# Patient Record
Sex: Female | Born: 1940 | Race: Black or African American | Hispanic: No | Smoking: Former smoker
Health system: Southern US, Community
[De-identification: ages and names within clinical notes are randomized; demographics above are authoritative.]

## PROBLEM LIST (undated history)

## (undated) DIAGNOSIS — I219 Acute myocardial infarction, unspecified: Secondary | ICD-10-CM

## (undated) DIAGNOSIS — I10 Essential (primary) hypertension: Secondary | ICD-10-CM

## (undated) DIAGNOSIS — E119 Type 2 diabetes mellitus without complications: Secondary | ICD-10-CM

## (undated) DIAGNOSIS — D649 Anemia, unspecified: Secondary | ICD-10-CM

## (undated) HISTORY — PX: ABDOMINAL HYSTERECTOMY: SHX81

---

## 2003-08-20 ENCOUNTER — Encounter: Payer: Self-pay | Admitting: Emergency Medicine

## 2003-08-20 ENCOUNTER — Emergency Department (HOSPITAL_COMMUNITY): Admission: EM | Admit: 2003-08-20 | Discharge: 2003-08-20 | Payer: Self-pay | Admitting: Internal Medicine

## 2006-06-15 ENCOUNTER — Encounter (HOSPITAL_COMMUNITY): Admission: RE | Admit: 2006-06-15 | Discharge: 2006-08-26 | Payer: Self-pay | Admitting: Gastroenterology

## 2006-06-18 ENCOUNTER — Ambulatory Visit (HOSPITAL_COMMUNITY): Admission: RE | Admit: 2006-06-18 | Discharge: 2006-06-18 | Payer: Self-pay | Admitting: Gastroenterology

## 2006-06-22 ENCOUNTER — Ambulatory Visit (HOSPITAL_COMMUNITY): Admission: RE | Admit: 2006-06-22 | Discharge: 2006-06-22 | Payer: Self-pay | Admitting: Gastroenterology

## 2014-05-03 ENCOUNTER — Encounter (HOSPITAL_COMMUNITY): Payer: Self-pay | Admitting: Emergency Medicine

## 2014-05-03 ENCOUNTER — Emergency Department (INDEPENDENT_AMBULATORY_CARE_PROVIDER_SITE_OTHER)
Admission: EM | Admit: 2014-05-03 | Discharge: 2014-05-03 | Disposition: A | Discharge status: Home / Self Care | Payer: Medicare PPO | Source: Home / Self Care | Attending: Family Medicine | Admitting: Family Medicine

## 2014-05-03 DIAGNOSIS — I10 Essential (primary) hypertension: Secondary | ICD-10-CM

## 2014-05-03 HISTORY — DX: Essential (primary) hypertension: I10

## 2014-05-03 LAB — POCT I-STAT, CHEM 8
BUN: 20 mg/dL (ref 6–23)
CALCIUM ION: 1.32 mmol/L — AB (ref 1.13–1.30)
CREATININE: 1 mg/dL (ref 0.50–1.10)
Chloride: 101 mEq/L (ref 96–112)
Glucose, Bld: 123 mg/dL — ABNORMAL HIGH (ref 70–99)
HCT: 46 % (ref 36.0–46.0)
HEMOGLOBIN: 15.6 g/dL — AB (ref 12.0–15.0)
POTASSIUM: 3.6 meq/L — AB (ref 3.7–5.3)
Sodium: 145 mEq/L (ref 137–147)
TCO2: 30 mmol/L (ref 0–100)

## 2014-05-03 MED ORDER — BENAZEPRIL-HYDROCHLOROTHIAZIDE 20-25 MG PO TABS: 1.0000 | ORAL_TABLET | Freq: Every day | ORAL | Status: DC

## 2014-05-03 MED ORDER — METOPROLOL SUCCINATE ER 50 MG PO TB24: 50.0000 mg | ORAL_TABLET | Freq: Every day | ORAL | Status: DC

## 2014-05-03 NOTE — ED Notes (Signed)
Pt is here to have meds refilled  Voices no problems Alert w/no signs of acute distress.

## 2014-05-03 NOTE — ED Provider Notes (Signed)
Jasmin Miller is a 73 y.o. female who presents to Urgent Care today for blood pressure medication refill. Patient typically goes to an urgent care on [facility]. for her blood pressure management. It's more convenient for her to attend this clinic today. She requires refill of for her Lotensin HCT and Toprol-XL. She feels well. No chest pain palpitations or shortness of breath.   Past Medical History  Diagnosis Date  . Hypertension    History  Substance Use Topics  . Smoking status: Never Smoker   . Smokeless tobacco: Not on file  . Alcohol Use: No   ROS as above Medications: No current facility-administered medications for this encounter.   Current Outpatient Prescriptions  Medication Sig Dispense Refill  . lansoprazole (PREVACID) 30 MG capsule Take 30 mg by mouth daily at 12 noon.      . simvastatin (ZOCOR) 20 MG tablet Take 20 mg by mouth daily.      . benazepril-hydrochlorthiazide (LOTENSIN HCT) 20-25 MG per tablet Take 1 tablet by mouth daily.  30 tablet  0  . metoprolol succinate (TOPROL-XL) 50 MG 24 hr tablet Take 1 tablet (50 mg total) by mouth daily. Take with or immediately following a meal.  30 tablet  0    Exam:  BP 153/50  Pulse 90  Temp(Src) 98.4 F (36.9 C) (Oral)  Resp 18  SpO2 95% Gen: Well NAD HEENT:  MMM Lungs: Normal work of breathing. CTABL Heart: RRR no MRG Abd: NABS, Soft. NT, ND Exts: Brisk capillary refill, warm and well perfused.   Results for orders placed during the hospital encounter of 05/03/14 (from the past 24 hour(s))  POCT I-STAT, CHEM 8     Status: Abnormal   Collection Time    05/03/14  5:44 PM      Result Value Ref Range   Sodium 145  137 - 147 mEq/L   Potassium 3.6 (*) 3.7 - 5.3 mEq/L   Chloride 101  96 - 112 mEq/L   BUN 20  6 - 23 mg/dL   Creatinine, Ser 1.82  0.50 - 1.10 mg/dL   Glucose, Bld 993 (*) 70 - 99 mg/dL   Calcium, Ion 7.16 (*) 1.13 - 1.30 mmol/L   TCO2 30  0 - 100 mmol/L   Hemoglobin 15.6 (*) 12.0 - 15.0 g/dL    HCT 96.7  89.3 - 81.0 %   No results found.  Assessment and Plan: 73 y.o. female with refill medications as above. Followup with primary care provider.  Discussed warning signs or symptoms. Please see discharge instructions. Patient expresses understanding.    Rodolph Bong, MD 05/03/14 [phone]

## 2014-05-03 NOTE — Discharge Instructions (Signed)
Thank you for coming in today. Please follow up with your primary doctor.  Call or go to the emergency room if you get worse, have trouble breathing, have chest pains, or palpitations.    Arterial Hypertension Arterial hypertension (high blood pressure) is a condition of elevated pressure in your blood vessels. Hypertension over a long period of time is a risk factor for strokes, heart attacks, and heart failure. It is also the leading cause of kidney (renal) failure.  CAUSES   In Adults -- Over 90% of all hypertension has no known cause. This is called essential or primary hypertension. In the other 10% of people with hypertension, the increase in blood pressure is caused by another disorder. This is called secondary hypertension. Important causes of secondary hypertension are:  Heavy alcohol use.  Obstructive sleep apnea.  Hyperaldosterosim (Conn's syndrome).  Steroid use.  Chronic kidney failure.  Hyperparathyroidism.  Medications.  Renal artery stenosis.  Pheochromocytoma.  Cushing's disease.  Coarctation of the aorta.  Scleroderma renal crisis.  Licorice (in excessive amounts).  Drugs (cocaine, methamphetamine). Your caregiver can explain any items above that apply to you.  In Children -- Secondary hypertension is more common and should always be considered.  Pregnancy -- Few women of childbearing age have high blood pressure. However, up to 10% of them develop hypertension of pregnancy. Generally, this will not harm the woman. It may be a sign of 3 complications of pregnancy: preeclampsia, HELLP syndrome, and eclampsia. Follow up and control with medication is necessary. SYMPTOMS   This condition normally does not produce any noticeable symptoms. It is usually found during a routine exam.  Malignant hypertension is a late problem of high blood pressure. It may have the following symptoms:  Headaches.  Blurred vision.  End-organ damage (this means your  kidneys, heart, lungs, and other organs are being damaged).  Stressful situations can increase the blood pressure. If a person with normal blood pressure has their blood pressure go up while being seen by their caregiver, this is often termed "white coat hypertension." Its importance is not known. It may be related with eventually developing hypertension or complications of hypertension.  Hypertension is often confused with mental tension, stress, and anxiety. DIAGNOSIS  The diagnosis is made by 3 separate blood pressure measurements. They are taken at least 1 week apart from each other. If there is organ damage from hypertension, the diagnosis may be made without repeat measurements. Hypertension is usually identified by having blood pressure readings:  Above 140/90 mmHg measured in both arms, at 3 separate times, over a couple weeks.  Over 130/80 mmHg should be considered a risk factor and may require treatment in patients with diabetes. Blood pressure readings over 120/80 mmHg are called "pre-hypertension" even in non-diabetic patients. To get a true blood pressure measurement, use the following guidelines. Be aware of the factors that can alter blood pressure readings.  Take measurements at least 1 hour after caffeine.  Take measurements 30 minutes after smoking and without any stress. This is another reason to quit smoking  it raises your blood pressure.  Use a proper cuff size. Ask your caregiver if you are not sure about your cuff size.  Most home blood pressure cuffs are automatic. They will measure systolic and diastolic pressures. The systolic pressure is the pressure reading at the start of sounds. Diastolic pressure is the pressure at which the sounds disappear. If you are elderly, measure pressures in multiple postures. Try sitting, lying or standing.  Sit  at rest for a minimum of 5 minutes before taking measurements.  You should not be on any medications like decongestants.  These are found in many cold medications.  Record your blood pressure readings and review them with your caregiver. If you have hypertension:  Your caregiver may do tests to be sure you do not have secondary hypertension (see "causes" above).  Your caregiver may also look for signs of metabolic syndrome. This is also called Syndrome X or Insulin Resistance Syndrome. You may have this syndrome if you have type 2 diabetes, abdominal obesity, and abnormal blood lipids in addition to hypertension.  Your caregiver will take your medical and family history and perform a physical exam.  Diagnostic tests may include blood tests (for glucose, cholesterol, potassium, and kidney function), a urinalysis, or an EKG. Other tests may also be necessary depending on your condition. PREVENTION  There are important lifestyle issues that you can adopt to reduce your chance of developing hypertension:  Maintain a normal weight.  Limit the amount of salt (sodium) in your diet.  Exercise often.  Limit alcohol intake.  Get enough potassium in your diet. Discuss specific advice with your caregiver.  Follow a DASH diet (dietary approaches to stop hypertension). This diet is rich in fruits, vegetables, and low-fat dairy products, and avoids certain fats. PROGNOSIS  Essential hypertension cannot be cured. Lifestyle changes and medical treatment can lower blood pressure and reduce complications. The prognosis of secondary hypertension depends on the underlying cause. Many people whose hypertension is controlled with medicine or lifestyle changes can live a normal, healthy life.  RISKS AND COMPLICATIONS  While high blood pressure alone is not an illness, it often requires treatment due to its short- and long-term effects on many organs. Hypertension increases your risk for:  CVAs or strokes (cerebrovascular accident).  Heart failure due to chronically high blood pressure (hypertensive cardiomyopathy).  Heart  attack (myocardial infarction).  Damage to the retina (hypertensive retinopathy).  Kidney failure (hypertensive nephropathy). Your caregiver can explain list items above that apply to you. Treatment of hypertension can significantly reduce the risk of complications. TREATMENT   For overweight patients, weight loss and regular exercise are recommended. Physical fitness lowers blood pressure.  Mild hypertension is usually treated with diet and exercise. A diet rich in fruits and vegetables, fat-free dairy products, and foods low in fat and salt (sodium) can help lower blood pressure. Decreasing salt intake decreases blood pressure in a 1/3 of people.  Stop smoking if you are a smoker. The steps above are highly effective in reducing blood pressure. While these actions are easy to suggest, they are difficult to achieve. Most patients with moderate or severe hypertension end up requiring medications to bring their blood pressure down to a normal level. There are several classes of medications for treatment. Blood pressure pills (antihypertensives) will lower blood pressure by their different actions. Lowering the blood pressure by 10 mmHg may decrease the risk of complications by as much as 25%. The goal of treatment is effective blood pressure control. This will reduce your risk for complications. Your caregiver will help you determine the best treatment for you according to your lifestyle. What is excellent treatment for one person, may not be for you. HOME CARE INSTRUCTIONS   Do not smoke.  Follow the lifestyle changes outlined in the "Prevention" section.  If you are on medications, follow the directions carefully. Blood pressure medications must be taken as prescribed. Skipping doses reduces their benefit. It also puts  you at risk for problems.  Follow up with your caregiver, as directed.  If you are asked to monitor your blood pressure at home, follow the guidelines in the "Diagnosis"  section above. SEEK MEDICAL CARE IF:   You think you are having medication side effects.  You have recurrent headaches or lightheadedness.  You have swelling in your ankles.  You have trouble with your vision. SEEK IMMEDIATE MEDICAL CARE IF:   You have sudden onset of chest pain or pressure, difficulty breathing, or other symptoms of a heart attack.  You have a severe headache.  You have symptoms of a stroke (such as sudden weakness, difficulty speaking, difficulty walking). MAKE SURE YOU:   Understand these instructions.  Will watch your condition.  Will get help right away if you are not doing well or get worse. Document Released: 11/24/2005 Document Revised: 02/16/2012 Document Reviewed: 06/24/2007 [facility] Patient Information 2014 [place], [place].

## 2014-12-17 ENCOUNTER — Encounter (HOSPITAL_COMMUNITY): Payer: Self-pay

## 2014-12-17 ENCOUNTER — Emergency Department (INDEPENDENT_AMBULATORY_CARE_PROVIDER_SITE_OTHER)
Admission: EM | Admit: 2014-12-17 | Discharge: 2014-12-17 | Disposition: A | Discharge status: Home / Self Care | Payer: Medicare PPO | Source: Home / Self Care | Attending: Emergency Medicine | Admitting: Emergency Medicine

## 2014-12-17 DIAGNOSIS — K649 Unspecified hemorrhoids: Secondary | ICD-10-CM

## 2014-12-17 LAB — POCT I-STAT, CHEM 8
BUN: 21 mg/dL (ref 6–23)
Calcium, Ion: 1.19 mmol/L (ref 1.13–1.30)
Chloride: 99 mEq/L (ref 96–112)
Creatinine, Ser: 0.9 mg/dL (ref 0.50–1.10)
Glucose, Bld: 134 mg/dL — ABNORMAL HIGH (ref 70–99)
HEMATOCRIT: 51 % — AB (ref 36.0–46.0)
HEMOGLOBIN: 17.3 g/dL — AB (ref 12.0–15.0)
Potassium: 3.5 mmol/L (ref 3.5–5.1)
Sodium: 138 mmol/L (ref 135–145)
TCO2: 25 mmol/L (ref 0–100)

## 2014-12-17 MED ORDER — POLYETHYLENE GLYCOL 3350 17 GM/SCOOP PO POWD: 17.0000 g | Freq: Three times a day (TID) | ORAL | Status: DC | PRN

## 2014-12-17 MED ORDER — SENNOSIDES-DOCUSATE SODIUM 8.6-50 MG PO TABS: 2.0000 | ORAL_TABLET | Freq: Every day | ORAL | Status: DC

## 2014-12-17 NOTE — ED Provider Notes (Signed)
CSN: [id number]     Arrival date & time 12/17/14  1855 History   First MD Initiated Contact with Patient 12/17/14 1904     Chief Complaint  Patient presents with  . Rectal Bleeding   (Consider location/radiation/quality/duration/timing/severity/associated sxs/prior Treatment) HPI She is a 73 year old woman here with her daughter for evaluation of rectal bleeding. She states she had a small amount of blood with a bowel movement last night. She had another bowel movement around 6 PM that had quite a bit of bright red blood in it. She states that she has a little bit of blood anytime she strains to use the bathroom, but this was more blood than is usual. She does report some lightheadedness that gets worse on standing as well as some shortness of breath and tingling in her legs.  No chest pain.  She states she has chronic anemia for which she takes an iron pill daily.  She does have a history of hemorrhoids.  Past Medical History  Diagnosis Date  . Hypertension    History reviewed. No pertinent past surgical history. No family history on file. History  Substance Use Topics  . Smoking status: Never Smoker   . Smokeless tobacco: Not on file  . Alcohol Use: No   OB History    No data available     Review of Systems  Respiratory: Positive for shortness of breath.   Cardiovascular: Negative for chest pain.  Gastrointestinal: Positive for constipation and blood in stool.  Neurological: Positive for dizziness and numbness.    Allergies  Review of patient's allergies indicates no known allergies.  Home Medications   Prior to Admission medications   Medication Sig Start Date End Date Taking? Authorizing Provider  benazepril-hydrochlorthiazide (LOTENSIN HCT) 20-25 MG per tablet Take 1 tablet by mouth daily. 05/03/14   Rodolph Bong, MD  lansoprazole (PREVACID) 30 MG capsule Take 30 mg by mouth daily at 12 noon.    Historical Provider, MD  metoprolol succinate (TOPROL-XL) 50 MG 24 hr  tablet Take 1 tablet (50 mg total) by mouth daily. Take with or immediately following a meal. 05/03/14   Rodolph Bong, MD  polyethylene glycol powder (GLYCOLAX/MIRALAX) powder Take 17 g by mouth 3 (three) times daily as needed for mild constipation or moderate constipation. 12/17/14   Charm Rings, MD  senna-docusate (SENOKOT-S) 8.6-50 MG per tablet Take 2 tablets by mouth at bedtime. 12/17/14   Charm Rings, MD  simvastatin (ZOCOR) 20 MG tablet Take 20 mg by mouth daily.    Historical Provider, MD   BP 150/88 mmHg  Pulse 89  Temp(Src) 98.2 F (36.8 C) (Oral)  Resp 18  SpO2 96% Physical Exam  Constitutional: She is oriented to person, place, and time. She appears well-developed and well-nourished. No distress.  Cardiovascular: Normal rate, regular rhythm and normal heart sounds.   No murmur heard. Pulmonary/Chest: Effort normal and breath sounds normal. No respiratory distress. She has no wheezes. She has no rales.  Genitourinary: Rectal exam shows external hemorrhoid. Rectal exam shows no internal hemorrhoid, no fissure and no tenderness. Guaiac positive stool.  Neurological: She is alert and oriented to person, place, and time.    ED Course  Procedures (including critical care time) Labs Review Labs Reviewed  POCT I-STAT, CHEM 8 - Abnormal; Notable for the following:    Glucose, Bld 134 (*)    Hemoglobin 17.3 (*)    HCT 51.0 (*)    All other components within normal limits  Imaging Review No results found.   MDM   1. Hemorrhoids, unspecified hemorrhoid type    Hemoglobin is stable. Dizziness and shortness of breath is likely secondary to a vasovagal response. She states it is starting to improve. We'll treat constipation with Senokot and Miralax. Follow-up as needed.    Charm RingsErin J Janeese Mcgloin, MD 12/17/14 [phone]

## 2014-12-17 NOTE — Discharge Instructions (Signed)
You have a hemorrhoid that bleeds with straining. Take Sena-Kot 2 tablets at bedtime. Take Miralax 2-3 times a day until you are having a soft bowel movement daily.  After that, take it once a day.  If you develop diarrhea, back off on the Miralax. You hemoglobin is stable.  Follow up as needed.

## 2014-12-17 NOTE — ED Notes (Signed)
Patient states had a bowel movement earlier today with bright red bleeding Patient states she does have a history of hemorrhoids Patient is feeling lightheaded and legs are weak and tingling

## 2015-10-03 ENCOUNTER — Emergency Department (HOSPITAL_COMMUNITY): Payer: Medicare HMO

## 2015-10-03 ENCOUNTER — Encounter (HOSPITAL_COMMUNITY): Payer: Self-pay | Admitting: Emergency Medicine

## 2015-10-03 ENCOUNTER — Emergency Department (HOSPITAL_COMMUNITY)
Admission: EM | Admit: 2015-10-03 | Discharge: 2015-10-03 | Disposition: A | Discharge status: Home / Self Care | Payer: Medicare HMO | Attending: Emergency Medicine | Admitting: Emergency Medicine

## 2015-10-03 DIAGNOSIS — R200 Anesthesia of skin: Secondary | ICD-10-CM | POA: Diagnosis present

## 2015-10-03 DIAGNOSIS — R0602 Shortness of breath: Secondary | ICD-10-CM | POA: Insufficient documentation

## 2015-10-03 DIAGNOSIS — R42 Dizziness and giddiness: Secondary | ICD-10-CM | POA: Diagnosis not present

## 2015-10-03 DIAGNOSIS — Z7982 Long term (current) use of aspirin: Secondary | ICD-10-CM | POA: Insufficient documentation

## 2015-10-03 DIAGNOSIS — Z79899 Other long term (current) drug therapy: Secondary | ICD-10-CM | POA: Insufficient documentation

## 2015-10-03 DIAGNOSIS — I10 Essential (primary) hypertension: Secondary | ICD-10-CM | POA: Insufficient documentation

## 2015-10-03 LAB — URINALYSIS, ROUTINE W REFLEX MICROSCOPIC
BILIRUBIN URINE: NEGATIVE
GLUCOSE, UA: NEGATIVE mg/dL
Ketones, ur: NEGATIVE mg/dL
Leukocytes, UA: NEGATIVE
Nitrite: NEGATIVE
PH: 6 (ref 5.0–8.0)
Protein, ur: NEGATIVE mg/dL
Specific Gravity, Urine: 1.008 (ref 1.005–1.030)
Urobilinogen, UA: 0.2 mg/dL (ref 0.0–1.0)

## 2015-10-03 LAB — COMPREHENSIVE METABOLIC PANEL
ALBUMIN: 4.3 g/dL (ref 3.5–5.0)
ALT: 18 U/L (ref 14–54)
ANION GAP: 10 (ref 5–15)
AST: 22 U/L (ref 15–41)
Alkaline Phosphatase: 85 U/L (ref 38–126)
BILIRUBIN TOTAL: 0.7 mg/dL (ref 0.3–1.2)
BUN: 11 mg/dL (ref 6–20)
CO2: 27 mmol/L (ref 22–32)
Calcium: 9.8 mg/dL (ref 8.9–10.3)
Chloride: 100 mmol/L — ABNORMAL LOW (ref 101–111)
Creatinine, Ser: 0.73 mg/dL (ref 0.44–1.00)
GFR calc Af Amer: >60 mL/min (ref >60)
GLUCOSE: 115 mg/dL — AB (ref 65–99)
Potassium: 3.2 mmol/L — ABNORMAL LOW (ref 3.5–5.1)
Sodium: 137 mmol/L (ref 135–145)
TOTAL PROTEIN: 8.5 g/dL — AB (ref 6.5–8.1)

## 2015-10-03 LAB — CBC
HCT: 45.3 % (ref 36.0–46.0)
Hemoglobin: 15.5 g/dL — ABNORMAL HIGH (ref 12.0–15.0)
MCH: 31.8 pg (ref 26.0–34.0)
MCHC: 34.2 g/dL (ref 30.0–36.0)
MCV: 92.8 fL (ref 78.0–100.0)
Platelets: 227 10*3/uL (ref 150–400)
RBC: 4.88 MIL/uL (ref 3.87–5.11)
RDW: 13 % (ref 11.5–15.5)
WBC: 8.9 10*3/uL (ref 4.0–10.5)

## 2015-10-03 LAB — DIFFERENTIAL
BASOS ABS: 0 10*3/uL (ref 0.0–0.1)
Basophils Relative: 0 %
EOS ABS: 0.1 10*3/uL (ref 0.0–0.7)
EOS PCT: 1 %
LYMPHS ABS: 1.4 10*3/uL (ref 0.7–4.0)
Lymphocytes Relative: 16 %
Monocytes Absolute: 0.5 10*3/uL (ref 0.1–1.0)
Monocytes Relative: 6 %
NEUTROS PCT: 77 %
Neutro Abs: 6.9 10*3/uL (ref 1.7–7.7)

## 2015-10-03 LAB — URINE MICROSCOPIC-ADD ON

## 2015-10-03 LAB — ETHANOL

## 2015-10-03 LAB — TROPONIN I: Troponin I: <0.03 ng/mL (ref <0.031)

## 2015-10-03 LAB — RAPID URINE DRUG SCREEN, HOSP PERFORMED
Amphetamines: NOT DETECTED
BENZODIAZEPINES: NOT DETECTED
Barbiturates: NOT DETECTED
Cocaine: NOT DETECTED
OPIATES: NOT DETECTED
TETRAHYDROCANNABINOL: NOT DETECTED

## 2015-10-03 LAB — APTT: APTT: 32 s (ref 24–37)

## 2015-10-03 LAB — PROTIME-INR
INR: 0.98 (ref 0.00–1.49)
PROTHROMBIN TIME: 13.2 s (ref 11.6–15.2)

## 2015-10-03 MED ORDER — METOPROLOL SUCCINATE ER 50 MG PO TB24: 50.0000 mg | ORAL_TABLET | Freq: Every day | ORAL | Status: DC

## 2015-10-03 MED ORDER — BENAZEPRIL-HYDROCHLOROTHIAZIDE 20-25 MG PO TABS: 1.0000 | ORAL_TABLET | Freq: Every day | ORAL | Status: DC

## 2015-10-03 MED ORDER — NIFEDIPINE ER 30 MG PO TB24: 30.0000 mg | ORAL_TABLET | Freq: Every evening | ORAL | Status: DC

## 2015-10-03 NOTE — ED Provider Notes (Signed)
3:24 PM BP 155/79 mmHg  Pulse 82  Temp(Src) 98.1 F (36.7 C)  Resp 18  SpO2 100% [facility], out of meds. New onset left hand paresthsia. Consult with neurologist- will need MRI. Currently pending.  5:00 PM BP 169/86 mmHg  Pulse 77  Temp(Src) 98.1 F (36.7 C)  Resp 16  SpO2 97% Patient MRI  Is negative. Appears safe for discharge as planned with initial provider,.  Arthor CaptainAbigail Kaemon Barnett, PA-C 10/03/15 1703  Elwin MochaBlair Walden, MD 10/06/15 [phone]

## 2015-10-03 NOTE — ED Provider Notes (Signed)
74 year old female, presents with a couple of complaints including left ear fullness as well as left hand numbness, it is unclear the exact onset of the symptoms however it seems to been going on for couple of days, the hand is gradually improving and she states it is almost completely resolved. She denies any weakness with this, there is no leg symptoms, no facial symptoms and on exam she has a normal neurologic exam including sensory exam of the upper extremities lower extremities as well as strength of the upper and lower extremities and cranial nerves III through XII. Abdomen chest and pulmonary exams are normal, no carotid bruits, labs EKG and CT scan are normal, MRI will be ordered to rule out stroke. Neurology was consult by Mr. Dahlia ClientBrowning  Medical screening examination/treatment/procedure(s) were conducted as a shared visit with non-physician practitioner(s) and myself.  I personally evaluated the patient during the encounter.  Clinical Impression:   Final diagnoses:  Numbness of left hand  Essential hypertension      EKG Interpretation  Date/Time:  Wednesday October 03 2015 12:20:32 EDT Ventricular Rate:  90 PR Interval:  132 QRS Duration: 76 QT Interval:  375 QTC Calculation: 459 R Axis:   13 Text Interpretation:  Sinus rhythm Ventricular premature complex Probable left atrial enlargement since last tracing no significant change Confirmed by Hyacinth MeekerMILLER  MD, Calleen Alvis ([id number]) on 10/03/2015 12:24:02 PM        Eber HongBrian Culley Hedeen, MD 10/04/15 [phone]

## 2015-10-03 NOTE — ED Provider Notes (Signed)
CSN: [id number]     Arrival date & time 10/03/15  1204 History   First MD Initiated Contact with Patient 10/03/15 1210     Chief Complaint  Patient presents with  . Hypertension     (Consider location/radiation/quality/duration/timing/severity/associated sxs/prior Treatment) HPI Comments: Patient presents to the emergency department with chief complaint of left arm numbness and tingling. She states that the symptoms started about 3 days ago. She states that she was seen by her doctor today, and was referred to the emergency department for stroke rule out. Patient states that her blood pressure has been running high. She has been taking her blood pressure medicines as no relief. She states that she has had some associated dizziness and shortness of breath. She denies any headache, chest pain, nausea, or vomiting. There are no aggravating or alleviating factors.  She takes a baby aspirin daily. Denies any other complaints at this time.  The history is provided by the patient. No language interpreter was used.    Past Medical History  Diagnosis Date  . Hypertension    History reviewed. No pertinent past surgical history. No family history on file. Social History  Substance Use Topics  . Smoking status: Never Smoker   . Smokeless tobacco: None  . Alcohol Use: No   OB History    No data available     Review of Systems  Constitutional: Negative for fever and chills.  Respiratory: Negative for shortness of breath.   Cardiovascular: Negative for chest pain.  Gastrointestinal: Negative for nausea, vomiting, diarrhea and constipation.  Genitourinary: Negative for dysuria.  Neurological: Positive for numbness.  All other systems reviewed and are negative.     Allergies  Review of patient's allergies indicates no known allergies.  Home Medications   Prior to Admission medications   Medication Sig Start Date End Date Taking? Authorizing Provider  benazepril-hydrochlorthiazide  (LOTENSIN HCT) 20-25 MG per tablet Take 1 tablet by mouth daily. 05/03/14   Rodolph BongEvan S Corey, MD  lansoprazole (PREVACID) 30 MG capsule Take 30 mg by mouth daily at 12 noon.    Historical Provider, MD  metoprolol succinate (TOPROL-XL) 50 MG 24 hr tablet Take 1 tablet (50 mg total) by mouth daily. Take with or immediately following a meal. 05/03/14   Rodolph BongEvan S Corey, MD  polyethylene glycol powder (GLYCOLAX/MIRALAX) powder Take 17 g by mouth 3 (three) times daily as needed for mild constipation or moderate constipation. 12/17/14   Charm RingsErin J Honig, MD  senna-docusate (SENOKOT-S) 8.6-50 MG per tablet Take 2 tablets by mouth at bedtime. 12/17/14   Charm RingsErin J Honig, MD  simvastatin (ZOCOR) 20 MG tablet Take 20 mg by mouth daily.    Historical Provider, MD   BP 187/93 mmHg  Pulse 85  Temp(Src) 98.1 F (36.7 C)  SpO2 98% Physical Exam  Constitutional: She is oriented to person, place, and time. She appears well-developed and well-nourished.  HENT:  Head: Normocephalic and atraumatic.  Eyes: Conjunctivae and EOM are normal. Pupils are equal, round, and reactive to light.  Neck: Normal range of motion. Neck supple.  Cardiovascular: Normal rate and regular rhythm.  Exam reveals no gallop and no friction rub.   No murmur heard. Pulmonary/Chest: Effort normal and breath sounds normal. No respiratory distress. She has no wheezes. She has no rales. She exhibits no tenderness.  Abdominal: Soft. Bowel sounds are normal. She exhibits no distension and no mass. There is no tenderness. There is no rebound and no guarding.  Musculoskeletal: Normal range of  motion. She exhibits no edema or tenderness.  CN 3-12 intact, speech is clear, movements are goal oriented, normal finger to nose, no pronator drift, reports decreased sensation on left hand, but equal sensation on exam  Neurological: She is alert and oriented to person, place, and time.  Skin: Skin is warm and dry.  Psychiatric: She has a normal mood and affect. Her behavior  is normal. Judgment and thought content normal.  Nursing note and vitals reviewed.   ED Course  Procedures (including critical care time) Labs Review Labs Reviewed  CBC - Abnormal; Notable for the following:    Hemoglobin 15.5 (*)    All other components within normal limits  PROTIME-INR  APTT  DIFFERENTIAL  ETHANOL  COMPREHENSIVE METABOLIC PANEL  URINE RAPID DRUG SCREEN, HOSP PERFORMED  URINALYSIS, ROUTINE W REFLEX MICROSCOPIC (NOT AT [facility])  TROPONIN I    Imaging Review No results found. I have personally reviewed and evaluated these images and lab results as part of my medical decision-making.   EKG Interpretation   Date/Time:  Wednesday October 03 2015 12:20:32 EDT Ventricular Rate:  90 PR Interval:  132 QRS Duration: 76 QT Interval:  375 QTC Calculation: 459 R Axis:   13 Text Interpretation:  Sinus rhythm Ventricular premature complex Probable  left atrial enlargement since last tracing no significant change Confirmed  by MILLER  MD, BRIAN ([id number]) on 10/03/2015 12:24:02 PM      MDM   Final diagnoses:  Numbness of left hand    Patient with left hand numbness.  No strength deficits.  No other focal deficits.  Sent to ED by PCP for stroke workup.  Will check labs and CT.  2:12 PM Labs and imaging are reassuring.  Patient seen by and discussed with Dr. Hyacinth Meeker, who recommends neurology consult.  Anticipated MRI and home if negative.  Appreciate telephone consultation from Dr. Hosie Poisson, who recommends MRI and DC with outpatient follow-up if negative.    Patient understands and agrees with the plan.  Patient signed out to Manuela Neptune, who will continue care.  Plan:  F/u on MRI.  If negative, DC to home with outpatient follow-up.      Roxy Horseman, PA-C 10/03/15 1533  Eber Hong, MD 10/04/15 [phone]

## 2015-10-03 NOTE — ED Notes (Signed)
Pt to ED reports unable to decrease BP although meds. Also report hearing loss left side and tingling left hand. Pt denies chest pain nor headache yet feels dizzy and shortness of breath. Hx  HTN , send by PCP to be evaluated for possible stroke. Pt denies weakness to extremities, no neuro symptoms noticed upon assessment. No facial drooping  Either.

## 2015-10-03 NOTE — Discharge Instructions (Signed)
Paresthesia Paresthesia is an abnormal burning or prickling sensation. This sensation is generally felt in the hands, arms, legs, or feet. However, it may occur in any part of the body. Usually, it is not painful. The feeling may be described as:  Tingling or numbness.  Pins and needles.  Skin crawling.  Buzzing.  Limbs falling asleep.  Itching. Most people experience temporary (transient) paresthesia at some time in their lives. Paresthesia may occur when you breathe too quickly (hyperventilation). It can also occur without any apparent cause. Commonly, paresthesia occurs when pressure is placed on a nerve. The sensation quickly goes away after the pressure is removed. For some people, however, paresthesia is a long-lasting (chronic) condition that is caused by an underlying disorder. If you continue to have paresthesia, you may need further medical evaluation. HOME CARE INSTRUCTIONS Watch your condition for any changes. Taking the following actions may help to lessen any discomfort that you are feeling:  Avoid drinking alcohol.  Try acupuncture or massage to help relieve your symptoms.  Keep all follow-up visits as directed by your health care provider. This is important. SEEK MEDICAL CARE IF:  You continue to have episodes of paresthesia.  Your burning or prickling feeling gets worse when you walk.  You have pain, cramps, or dizziness.  You develop a rash. SEEK IMMEDIATE MEDICAL CARE IF:  You feel weak.  You have trouble walking or moving.  You have problems with speech, understanding, or vision.  You feel confused.  You cannot control your bladder or bowel movements.  You have numbness after an injury.  You faint.   This information is not intended to replace advice given to you by your health care provider. Make sure you discuss any questions you have with your health care provider.   Document Released: 11/14/2002 Document Revised: 04/10/2015 Document Reviewed:  11/20/2014 Elsevier Interactive Patient Education 2016 Elsevier Inc.  

## 2017-12-01 ENCOUNTER — Emergency Department (HOSPITAL_COMMUNITY)
Admission: EM | Admit: 2017-12-01 | Discharge: 2017-12-01 | Disposition: A | Discharge status: Home / Self Care | Payer: Medicare HMO | Attending: Emergency Medicine | Admitting: Emergency Medicine

## 2017-12-01 ENCOUNTER — Other Ambulatory Visit: Payer: Self-pay

## 2017-12-01 ENCOUNTER — Encounter (HOSPITAL_COMMUNITY): Payer: Self-pay | Admitting: *Deleted

## 2017-12-01 DIAGNOSIS — Z7982 Long term (current) use of aspirin: Secondary | ICD-10-CM | POA: Diagnosis not present

## 2017-12-01 DIAGNOSIS — T23261A Burn of second degree of back of right hand, initial encounter: Secondary | ICD-10-CM | POA: Diagnosis not present

## 2017-12-01 DIAGNOSIS — Y999 Unspecified external cause status: Secondary | ICD-10-CM | POA: Insufficient documentation

## 2017-12-01 DIAGNOSIS — X102XXA Contact with fats and cooking oils, initial encounter: Secondary | ICD-10-CM | POA: Insufficient documentation

## 2017-12-01 DIAGNOSIS — I10 Essential (primary) hypertension: Secondary | ICD-10-CM | POA: Diagnosis not present

## 2017-12-01 DIAGNOSIS — Z79899 Other long term (current) drug therapy: Secondary | ICD-10-CM | POA: Insufficient documentation

## 2017-12-01 DIAGNOSIS — T23001A Burn of unspecified degree of right hand, unspecified site, initial encounter: Secondary | ICD-10-CM | POA: Diagnosis present

## 2017-12-01 DIAGNOSIS — Y93G3 Activity, cooking and baking: Secondary | ICD-10-CM | POA: Diagnosis not present

## 2017-12-01 DIAGNOSIS — Y929 Unspecified place or not applicable: Secondary | ICD-10-CM | POA: Insufficient documentation

## 2017-12-01 HISTORY — DX: Acute myocardial infarction, unspecified: I21.9

## 2017-12-01 MED ORDER — SILVER SULFADIAZINE 1 % EX CREA: 1.0000 "application " | TOPICAL_CREAM | Freq: Every day | CUTANEOUS | 0 refills | Status: DC

## 2017-12-01 MED ORDER — TETANUS-DIPHTH-ACELL PERTUSSIS 5-2.5-18.5 LF-MCG/0.5 IM SUSP
0.5000 mL | Freq: Once | INTRAMUSCULAR | Status: AC
  Administered 2017-12-01: 0.5 mL via INTRAMUSCULAR
  Filled 2017-12-01: qty 0.5

## 2017-12-01 MED ORDER — SILVER SULFADIAZINE 1 % EX CREA
TOPICAL_CREAM | Freq: Once | CUTANEOUS | Status: AC
  Administered 2017-12-01: 1 via TOPICAL
  Filled 2017-12-01: qty 85

## 2017-12-01 NOTE — Discharge Instructions (Signed)
Please apply dressing at least twice a day and keep the hand clean and dry. See the plastic surgeon in 1 week.

## 2017-12-01 NOTE — ED Notes (Signed)
Pt states that she had a burning in her chest yesterday evening, and she took her acid reflux medication and now feels better.

## 2017-12-01 NOTE — ED Notes (Signed)
Pt declining labwork at this time; reports recently having bloodwork completed at [facility] for indigestion and fluttering in chest. Pt reports not wanting to be worked up for fluttering in her chest, only wants to be checked out for burn to hand

## 2017-12-01 NOTE — ED Triage Notes (Signed)
Pt c/o burn to R hand; pt had a frying pan hit the floor and she tried to grab the handle, burning her hand. Ruptured blisters to R hand with redness. Pt also reports feeling like her heart is fluttering, denies pain. Hx of MI in 1994

## 2017-12-01 NOTE — ED Provider Notes (Signed)
[facility] Provider Note   CSN: [id number] Arrival date & time: 12/01/17  [id number]     History   Chief Complaint Chief Complaint  Patient presents with  . Burn  . Heart Fluttering    HPI Jasmin Miller is a 76 y.o. female.  HPI Pt comes in with cc of hand burn. Pt reports that she dropped pan filled with oil, and the oil splattered over the dorsum of the hand causing burn injury. Pt is right handed. Pt has pain.  Patient has history of CAD.  She reports that at one point she had palpitations of her heart but the palpitations are now resolved. No chest pain.    Past Medical History:  Diagnosis Date  . Hypertension   . MI (myocardial infarction) (HCC)     There are no active problems to display for this patient.   History reviewed. No pertinent surgical history.  OB History    No data available       Home Medications    Prior to Admission medications   Medication Sig Start Date End Date Taking? Authorizing Provider  aspirin EC 81 MG tablet Take 81 mg by mouth daily.    [provider]  benazepril-hydrochlorthiazide (LOTENSIN HCT) 20-25 MG tablet Take 1 tablet by mouth daily. 10/03/15   Roxy HorsemanBrowning, Robert, PA-C  Ferrous Sulfate (IRON) 325 (65 FE) MG TABS Take 325 mg by mouth daily.    [provider]  lansoprazole (PREVACID) 30 MG capsule Take 30 mg by mouth daily.     [provider]  metoprolol succinate (TOPROL-XL) 50 MG 24 hr tablet Take 1 tablet (50 mg total) by mouth daily. Take with or immediately following a meal. 10/03/15   Roxy HorsemanBrowning, Robert, PA-C  Naphazoline-Glycerin (CLEAR EYES MAX REDNESS RELIEF) 0.03-0.5 % SOLN Place 1 drop into both eyes 4 (four) times daily as needed (For eye redness or irritation.).    [provider]  naproxen sodium (ALEVE) 220 MG tablet Take 220 mg by mouth every 8 (eight) hours as needed (For pain.).    [provider]  NIFEdipine  (PROCARDIA-XL/ADALAT CC) 30 MG 24 hr tablet Take 1 tablet (30 mg total) by mouth every evening. 10/03/15   Roxy HorsemanBrowning, Robert, PA-C  polyethylene glycol powder (GLYCOLAX/MIRALAX) powder Take 17 g by mouth 3 (three) times daily as needed for mild constipation or moderate constipation. Patient not taking: Reported on 10/03/2015 12/17/14   Charm RingsHonig, Erin J, MD  senna-docusate (SENOKOT-S) 8.6-50 MG per tablet Take 2 tablets by mouth at bedtime. Patient not taking: Reported on 10/03/2015 12/17/14   Charm RingsHonig, Erin J, MD  silver sulfADIAZINE (SILVADENE) 1 % cream Apply 1 application topically daily. 12/01/17   Derwood KaplanNanavati, Tabithia Stroder, MD  triamcinolone cream (KENALOG) 0.1 % Apply 1 application topically daily. Applies to elbows for dry skin.    [provider]    Family History No family history on file.  Social History Social History   Tobacco Use  . Smoking status: Never Smoker  . Smokeless tobacco: Never Used  Substance Use Topics  . Alcohol use: No  . Drug use: No     Allergies   Patient has no known allergies.   Review of Systems Review of Systems  Constitutional: Positive for activity change.  Respiratory: Negative for shortness of breath.   Cardiovascular: Positive for chest pain and palpitations.  Skin: Positive for rash and wound.  Allergic/Immunologic: Negative for immunocompromised state.     Physical Exam Updated Vital  Signs BP 134/68   Pulse 78   Temp 97.9 F (36.6 C) (Oral)   Resp 16   SpO2 100%   Physical Exam  Constitutional: She is oriented to person, place, and time. She appears well-developed.  HENT:  Head: Normocephalic and atraumatic.  Eyes: EOM are normal.  Neck: Normal range of motion. Neck supple.  Cardiovascular: Normal rate.  Pulmonary/Chest: Effort normal.  Abdominal: Bowel sounds are normal.  Neurological: She is alert and oriented to person, place, and time.  Skin: Skin is warm and dry. Rash noted.  Patient has erythema to the dorsum of the right  hand.  Patient also has 2 or 3 blisters. No circumferential burn. Range of motion of the wrist is normal.  Nursing note and vitals reviewed.    ED Treatments / Results  Labs (all labs ordered are listed, but only abnormal results are displayed) Labs Reviewed - No data to display  EKG  EKG Interpretation  Date/Time:  Tuesday December 01 2017 03:55:02 EST Ventricular Rate:  91 PR Interval:  126 QRS Duration: 80 QT Interval:  368 QTC Calculation: 452 R Axis:   19 Text Interpretation:  Sinus rhythm with Premature atrial complexes Otherwise normal ECG No acute changes No significant change since last tracing Confirmed by Derwood KaplanNanavati, Toniesha Zellner [phone] on 12/01/2017 5:23:05 AM       Radiology No results found.  Procedures Procedures (including critical care time)  Medications Ordered in ED Medications  Tdap (BOOSTRIX) injection 0.5 mL (not administered)  silver sulfADIAZINE (SILVADENE) 1 % cream (not administered)     Initial Impression / Assessment and Plan / ED Course  I have reviewed the triage vital signs and the nursing notes.  Pertinent labs & imaging results that were available during my care of the patient were reviewed by me and considered in my medical decision making (see chart for details).     Patient comes in after having a burn to her right hand. We will apply Silvadene ointment.  Patient does not have any chest discomfort at this time.  EKG is reassuring. We will not pursue any further workup.  Final Clinical Impressions(s) / ED Diagnoses   Final diagnoses:  Partial thickness burn of back of right hand, initial encounter    ED Discharge Orders        Ordered    silver sulfADIAZINE (SILVADENE) 1 % cream  Daily     12/01/17 0606       Derwood KaplanNanavati, Micco Bourbeau, MD 12/01/17 [phone]

## 2018-01-24 ENCOUNTER — Encounter (HOSPITAL_COMMUNITY): Payer: Self-pay | Admitting: Emergency Medicine

## 2018-01-24 ENCOUNTER — Ambulatory Visit (HOSPITAL_COMMUNITY)
Admission: EM | Admit: 2018-01-24 | Discharge: 2018-01-24 | Disposition: A | Discharge status: Home / Self Care | Payer: Medicare HMO

## 2018-01-24 ENCOUNTER — Other Ambulatory Visit: Payer: Self-pay

## 2018-01-24 DIAGNOSIS — R220 Localized swelling, mass and lump, head: Secondary | ICD-10-CM

## 2018-01-24 MED ORDER — LIDOCAINE VISCOUS 2 % MT SOLN: 5.0000 mL | OROMUCOSAL | 0 refills | Status: DC | PRN

## 2018-01-24 MED ORDER — AMOXICILLIN-POT CLAVULANATE 875-125 MG PO TABS: 1.0000 | ORAL_TABLET | Freq: Two times a day (BID) | ORAL | 0 refills | Status: DC

## 2018-01-24 MED ORDER — LIDOCAINE HCL (PF) 1 % IJ SOLN
INTRAMUSCULAR | Status: AC
  Filled 2018-01-24: qty 30

## 2018-01-24 NOTE — Discharge Instructions (Signed)
Stop the clindamycin and start taking augmentin.  Purchase some lemon drops it the event that this is a salivary stone.  Please call the ENT tomorrow. I have listed there phone number.  If you feel you need care emergently, the proceed to the ED and they may be able to drain the fluctuance for you, or can have an ENT come and evaluate you.

## 2018-01-24 NOTE — ED Triage Notes (Signed)
Pt states she saw her PCP last Sunday for some sores on her lips, her PCP said I twas coming from her gums, given 10 days antibiotics, and rinse with water. This evening the R side of her cheek started swelling.

## 2018-01-24 NOTE — ED Provider Notes (Signed)
01/24/2018 9:03 PM   DOB: 10/01/1941 / MRN: [id number]  SUBJECTIVE:  Jasmin Miller is a 77 y.o. female presenting for a swollen and tender right cheek.  She has been taking clindamycin for the last 6 days now for some sores on her lips which have since improved however today noticed that she was having some right cheek swelling.  She associates exquisite pain on the inside of the mouth.  Denies fever.  She has No Known Allergies.   She  has a past medical history of Hypertension and MI (myocardial infarction) (HCC).    She  reports that  has never smoked. she has never used smokeless tobacco. She reports that she does not drink alcohol or use drugs. She  has no sexual activity history on file. The patient  has no past surgical history on file.  Her family history is not on file.  Review of Systems  Constitutional: Negative for chills and fever.  HENT: Negative for sore throat.   Skin: Negative for itching and rash.  Neurological: Negative for dizziness.    OBJECTIVE:  BP (!) 179/88   Pulse 93   Temp 97.7 F (36.5 C)   Resp 18   SpO2 99%   BP Readings from Last 3 Encounters:  01/24/18 (!) 179/88  12/01/17 134/68  10/03/15 169/86     Physical Exam  Constitutional: She is active.  Non-toxic appearance.  HENT:  Head:    Mouth/Throat:    Cardiovascular: Normal rate.  Pulmonary/Chest: Effort normal. No tachypnea.  Neurological: She is alert.  Skin: Skin is warm and dry. She is not diaphoretic. No pallor.    No results found for this or any previous visit (from the past 72 hour(s)).  No results found.  ASSESSMENT AND PLAN:  No orders of the defined types were placed in this encounter.    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  She will call the ENT on-call tonight to monitor for an appointment.  We are stopping her clindamycin and starting Augmentin.  She will purchase some lemon drops.      The patient is advised to call or  return to clinic if she does not see an improvement in symptoms, or to seek the care of the closest emergency department if she worsens with the above plan.   Deliah BostonMichael Tishie Altmann, MHS, PA-C 01/24/2018 9:03 PM   Ofilia Neaslark, Dalvin Clipper L, PA-C 01/24/18 2104

## 2018-08-19 ENCOUNTER — Encounter: Payer: Self-pay | Admitting: Physician Assistant

## 2018-11-16 ENCOUNTER — Other Ambulatory Visit: Payer: Self-pay

## 2018-11-16 ENCOUNTER — Encounter (HOSPITAL_COMMUNITY): Payer: Self-pay

## 2018-11-16 ENCOUNTER — Observation Stay (HOSPITAL_COMMUNITY)
Admission: EM | Admit: 2018-11-16 | Discharge: 2018-11-18 | Disposition: A | Discharge status: Home / Self Care | Payer: Medicare HMO | Attending: Internal Medicine | Admitting: Internal Medicine

## 2018-11-16 DIAGNOSIS — M19012 Primary osteoarthritis, left shoulder: Secondary | ICD-10-CM | POA: Diagnosis not present

## 2018-11-16 DIAGNOSIS — D539 Nutritional anemia, unspecified: Principal | ICD-10-CM | POA: Insufficient documentation

## 2018-11-16 DIAGNOSIS — R634 Abnormal weight loss: Secondary | ICD-10-CM | POA: Insufficient documentation

## 2018-11-16 DIAGNOSIS — I252 Old myocardial infarction: Secondary | ICD-10-CM | POA: Insufficient documentation

## 2018-11-16 DIAGNOSIS — Z7984 Long term (current) use of oral hypoglycemic drugs: Secondary | ICD-10-CM | POA: Diagnosis not present

## 2018-11-16 DIAGNOSIS — M8580 Other specified disorders of bone density and structure, unspecified site: Secondary | ICD-10-CM | POA: Insufficient documentation

## 2018-11-16 DIAGNOSIS — D509 Iron deficiency anemia, unspecified: Secondary | ICD-10-CM | POA: Insufficient documentation

## 2018-11-16 DIAGNOSIS — I700 Atherosclerosis of aorta: Secondary | ICD-10-CM | POA: Insufficient documentation

## 2018-11-16 DIAGNOSIS — Z79899 Other long term (current) drug therapy: Secondary | ICD-10-CM | POA: Insufficient documentation

## 2018-11-16 DIAGNOSIS — D649 Anemia, unspecified: Secondary | ICD-10-CM | POA: Diagnosis not present

## 2018-11-16 DIAGNOSIS — M75102 Unspecified rotator cuff tear or rupture of left shoulder, not specified as traumatic: Secondary | ICD-10-CM | POA: Diagnosis not present

## 2018-11-16 DIAGNOSIS — E1159 Type 2 diabetes mellitus with other circulatory complications: Secondary | ICD-10-CM | POA: Diagnosis present

## 2018-11-16 DIAGNOSIS — E785 Hyperlipidemia, unspecified: Secondary | ICD-10-CM | POA: Insufficient documentation

## 2018-11-16 DIAGNOSIS — E538 Deficiency of other specified B group vitamins: Secondary | ICD-10-CM | POA: Insufficient documentation

## 2018-11-16 DIAGNOSIS — R0602 Shortness of breath: Secondary | ICD-10-CM | POA: Insufficient documentation

## 2018-11-16 DIAGNOSIS — K219 Gastro-esophageal reflux disease without esophagitis: Secondary | ICD-10-CM | POA: Diagnosis not present

## 2018-11-16 DIAGNOSIS — M19011 Primary osteoarthritis, right shoulder: Secondary | ICD-10-CM | POA: Diagnosis not present

## 2018-11-16 DIAGNOSIS — Z7982 Long term (current) use of aspirin: Secondary | ICD-10-CM | POA: Insufficient documentation

## 2018-11-16 DIAGNOSIS — I10 Essential (primary) hypertension: Secondary | ICD-10-CM | POA: Insufficient documentation

## 2018-11-16 DIAGNOSIS — M75101 Unspecified rotator cuff tear or rupture of right shoulder, not specified as traumatic: Secondary | ICD-10-CM | POA: Diagnosis not present

## 2018-11-16 DIAGNOSIS — E119 Type 2 diabetes mellitus without complications: Secondary | ICD-10-CM | POA: Diagnosis not present

## 2018-11-16 HISTORY — DX: Anemia, unspecified: D64.9

## 2018-11-16 HISTORY — DX: Type 2 diabetes mellitus without complications: E11.9

## 2018-11-16 NOTE — ED Provider Notes (Signed)
[facility] EMERGENCY DEPARTMENT Provider Note   CSN: [id number] Arrival date & time: 11/16/18  2335     History   Chief Complaint Chief Complaint  Patient presents with  . Shortness of Breath    HPI Jasmin Miller is a 77 y.o. female with a hisory of HTN, MI, and anemia who presents to the emergency department with a chief complaint of dyspnea.  The patient endorses dyspnea with exertion that has been gradually worsening over the last week.  No history of dyspnea at baseline.  She reports associated dizziness, lightheadedness, and palpitations over the last few days that is worse with standing.  States that she feels ""fuzzy headed and confused".   Reports that she had an episode tonight prior to arrival in the ED during which she checked her blood pressure and it was noted to be 88/54.  She reports that she checked her blood pressure several more times and it continued to be 80-100s systolically.  She reports that her home nifedipine dose was recently decreased due to hypotension.  She reports an unintentional weight loss over the last year.  States that she feels that this is due to decreased appetite.  She previously weighed 172, but was 159 pounds when she went to see her PCP in July.  She weighed 150 today.  Reports she has had some mild swelling in her lower legs over the last few weeks.  She reports that she has been having left-sided nosebleeds approximately 2-3 times per week for the last few months.  She states the bleeding will stop within 2 to 3 minutes of starting.  She denies passing clots.  Denies night sweats, chest pain, chest tightness, cough, URI symptoms, abdominal pain, nausea, vomiting, diarrhea, fever, chills, hematemesis, headache, syncope, melena, hematochezia, hematuria, or vaginal bleeding.  She is a former smoker for 33 years. Quit in 1994.  She works as a Lawyer.  Reports that she required a blood transfusion in 2007.  The history is  provided by the patient. No language interpreter was used.    Past Medical History:  Diagnosis Date  . DM2 (diabetes mellitus, type 2) (HCC)   . Hypertension   . MI (myocardial infarction) [facility])     Patient Active Problem List   Diagnosis Date Noted  . Macrocytic anemia 11/17/2018  . Symptomatic anemia 11/17/2018  . Unintentional weight loss 11/17/2018  . HTN (hypertension) 11/17/2018  . DM2 (diabetes mellitus, type 2) (HCC) 11/17/2018    History reviewed. No pertinent surgical history.   OB History   None      Home Medications    Prior to Admission medications   Medication Sig Start Date End Date Taking? Authorizing Provider  aspirin EC 81 MG tablet Take 81 mg by mouth daily.   Yes [provider]  Ferrous Sulfate (IRON) 325 (65 FE) MG TABS Take 325 mg by mouth daily.   Yes [provider]  lansoprazole (PREVACID) 30 MG capsule Take 30 mg by mouth daily.    Yes [provider]  lidocaine (XYLOCAINE) 2 % solution Use as directed 5-10 mLs in the mouth or throat as needed for mouth pain. 01/24/18  Yes Ofilia Neas, PA-C  lisinopril-hydrochlorothiazide (PRINZIDE,ZESTORETIC) 20-25 MG tablet Take 1 tablet by mouth daily.   Yes [provider]  metFORMIN (GLUCOPHAGE-XR) 500 MG 24 hr tablet Take 500 mg by mouth daily with breakfast.   Yes [provider]  metoprolol succinate (TOPROL-XL) 50 MG 24 hr tablet  Take 1 tablet (50 mg total) by mouth daily. Take with or immediately following a meal. Patient taking differently: Take 25 mg by mouth daily. Take with or immediately following a meal. 10/03/15  Yes Roxy Horseman, PA-C  Naphazoline-Glycerin (CLEAR EYES MAX REDNESS RELIEF) 0.03-0.5 % SOLN Place 1 drop into both eyes 4 (four) times daily as needed (For eye redness or irritation.).   Yes [provider]  naproxen sodium (ALEVE) 220 MG tablet Take 220 mg by mouth every 8 (eight) hours as needed (For pain.).   Yes [provider]  simvastatin (ZOCOR) 20 MG tablet Take 20 mg by mouth daily.   Yes [provider]  triamcinolone cream (KENALOG) 0.1 % Apply 1 application topically daily as needed (dry skin). Applies to elbows for dry skin.    Yes [provider]  NIFEdipine (PROCARDIA-XL/ADALAT CC) 30 MG 24 hr tablet Take 1 tablet (30 mg total) by mouth every evening. Patient not taking: Reported on 11/17/2018 10/03/15   Roxy Horseman, PA-C    Family History Family History  Problem Relation Age of Onset  . Anemia Mother   . Anemia Daughter     Social History Social History   Tobacco Use  . Smoking status: Never Smoker  . Smokeless tobacco: Never Used  Substance Use Topics  . Alcohol use: No  . Drug use: No     Allergies   Patient has no known allergies.   Review of Systems Review of Systems  Constitutional: Negative for activity change, chills and fever.  Respiratory: Positive for cough and shortness of breath.   Cardiovascular: Positive for leg swelling. Negative for chest pain.  Gastrointestinal: Negative for abdominal pain.  Genitourinary: Negative for dysuria.  Musculoskeletal: Negative for back pain.  Skin: Negative for rash.  Allergic/Immunologic: Negative for immunocompromised state.  Neurological: Positive for dizziness and light-headedness. Negative for syncope and headaches.  Psychiatric/Behavioral: Negative for confusion.   Physical Exam Updated Vital Signs BP (!) 124/57   Pulse 85   Temp 98.7 F (37.1 C) (Oral)   Resp 18   SpO2 100%   Physical Exam  Constitutional: No distress.  HENT:  Head: Normocephalic.  Eyes: Conjunctivae are normal.  Neck: Neck supple.  Cardiovascular: Normal rate, regular rhythm, normal heart sounds and intact distal pulses. Exam reveals no gallop and no friction rub.  No murmur heard. Pulmonary/Chest: Effort normal. No stridor. No respiratory distress. She has no wheezes. She has no rales. She exhibits no  tenderness.  Abdominal: Soft. Bowel sounds are normal. She exhibits no distension and no mass. There is no tenderness. There is no rebound and no guarding. No hernia.  Musculoskeletal: She exhibits edema. She exhibits no tenderness or deformity.  Trace edema to the bilateral lower extremities  Neurological: She is alert.  Skin: Skin is warm. Capillary refill takes less than 2 seconds. No rash noted. She is not diaphoretic. No pallor.  Psychiatric: Her behavior is normal.  Nursing note and vitals reviewed.   ED Treatments / Results  Labs (all labs ordered are listed, but only abnormal results are displayed) Labs Reviewed  CBC WITH DIFFERENTIAL/PLATELET - Abnormal; Notable for the following components:      Result Value   RBC 1.31 (*)    Hemoglobin 5.1 (*)    HCT 16.0 (*)    MCV 122.1 (*)    MCH 38.9 (*)    RDW 17.0 (*)    All other components within normal limits  COMPREHENSIVE METABOLIC PANEL - Abnormal;  Notable for the following components:   CO2 21 (*)    Glucose, Bld 120 (*)    Creatinine, Ser 1.05 (*)    Total Protein 6.0 (*)    GFR calc non Af Amer 51 (*)    GFR calc Af Amer 59 (*)    All other components within normal limits  BRAIN NATRIURETIC PEPTIDE - Abnormal; Notable for the following components:   B Natriuretic Peptide 106.6 (*)    All other components within normal limits  URINALYSIS, ROUTINE W REFLEX MICROSCOPIC - Abnormal; Notable for the following components:   Color, Urine STRAW (*)    Hgb urine dipstick SMALL (*)    All other components within normal limits  VITAMIN B12 - Abnormal; Notable for the following components:   Vitamin B-12 <50 (*)    All other components within normal limits  IRON AND TIBC - Abnormal; Notable for the following components:   Iron 17 (*)    TIBC 465 (*)    Saturation Ratios 4 (*)    All other components within normal limits  FERRITIN - Abnormal; Notable for the following components:   Ferritin 7 (*)    All other components  within normal limits  RETICULOCYTES - Abnormal; Notable for the following components:   Retic Ct Pct 4.3 (*)    RBC. 1.32 (*)    All other components within normal limits  CBC - Abnormal; Notable for the following components:   RBC 1.31 (*)    Hemoglobin 5.0 (*)    HCT 15.8 (*)    MCV 120.6 (*)    MCH 38.2 (*)    RDW 16.7 (*)    Platelets 142 (*)    nRBC 0.7 (*)    All other components within normal limits  FOLATE  POC OCCULT BLOOD, ED  TYPE AND SCREEN  PREPARE RBC (CROSSMATCH)    EKG EKG Interpretation  Date/Time:  Tuesday November 16 2018 23:51:02 EST Ventricular Rate:  87 PR Interval:    QRS Duration: 88 QT Interval:  370 QTC Calculation: 446 R Axis:   36 Text Interpretation:  Sinus rhythm Borderline short PR interval Confirmed by Ross MarcusHorton, Courtney ([id number]) on 11/17/2018 12:11:38 AM   Radiology Dg Chest 2 View  Result Date: 11/17/2018 CLINICAL DATA:  Dyspnea for the past 2 days. EXAM: CHEST - 2 VIEW COMPARISON:  None. FINDINGS: Normal sized heart. Clear lungs with normal vascularity. Tortuous and partially calcified thoracic aorta. Diffuse osteopenia. Bilateral shoulder degenerative changes and superior migration of the humeral heads. Thoracic spine degenerative changes. IMPRESSION: 1. No acute abnormality. 2. 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 Electronically Signed   By: Beckie SaltsSteven  Reid M.D.   On: 11/17/2018 01:54    Procedures .Critical Care Performed by: Barkley BoardsMcDonald, Mia A, PA-C Authorized by: Barkley BoardsMcDonald, Mia A, PA-C   Critical care provider statement:    Critical care time (minutes):  40   Critical care time was exclusive of:  Separately billable procedures and treating other patients and teaching time   Critical care was necessary to treat or prevent imminent or life-threatening deterioration of the following conditions:  Circulatory failure   Critical care was time spent personally by me on the following  activities:  Discussions with consultants, evaluation of patient's response to treatment, examination of patient, ordering and performing treatments and interventions, ordering and review of laboratory studies, ordering and review of radiographic studies, pulse oximetry, re-evaluation of patient's condition, obtaining history from patient or surrogate  and review of old charts   I assumed direction of critical care for this patient from another provider in my specialty: no     (including critical care time)  Medications Ordered in ED Medications  0.9 %  sodium chloride infusion (Manually program via Guardrails IV Fluids) (has no administration in time range)  metoprolol succinate (TOPROL-XL) 24 hr tablet 25 mg (has no administration in time range)  pantoprazole (PROTONIX) EC tablet 40 mg (has no administration in time range)  metFORMIN (GLUCOPHAGE-XR) 24 hr tablet 500 mg (has no administration in time range)  lisinopril (PRINIVIL,ZESTRIL) tablet 20 mg (has no administration in time range)  hydrochlorothiazide (HYDRODIURIL) tablet 25 mg (has no administration in time range)  acetaminophen (TYLENOL) tablet 650 mg (has no administration in time range)    Or  acetaminophen (TYLENOL) suppository 650 mg (has no administration in time range)  ondansetron (ZOFRAN) tablet 4 mg (has no administration in time range)    Or  ondansetron (ZOFRAN) injection 4 mg (has no administration in time range)  enoxaparin (LOVENOX) injection 40 mg (has no administration in time range)     Initial Impression / Assessment and Plan / ED Course  I have reviewed the triage vital signs and the nursing notes.  Pertinent labs & imaging results that were available during my care of the patient were reviewed by me and considered in my medical decision making (see chart for details).     77 year old female with a history of HTN, MI, and anemia presenting with dyspnea, low blood pressure, dizziness, lightheadedness, and  palpitations.The patient was discussed with Dr. Wilkie Aye, attending physician.  Chest x-ray is unremarkable.  EKG with sinus rhythm.  Opponent is negative.  Hemoglobin is 5.1.  Patient states this is down from her PCPs office of 7.8 in July.  She has a macrocytic anemia at 122.  Hemoccult is negative.  CMP is generally reassuring.  Will order 2 units of packed RBCs.  Anemia panel is pending.  Consulted the hospitalist team and spoke with Dr. Julian Reil who will accept the patient for admission for symptomatic anemia.  The patient appears reasonably stabilized for admission considering the current resources, flow, and capabilities available in the ED at this time, and I doubt any other [facility] requiring further screening and/or treatment in the ED prior to admission.  Final Clinical Impressions(s) / ED Diagnoses   Final diagnoses:  Symptomatic anemia    ED Discharge Orders    None       Barkley Boards, PA-C 11/17/18 [id number]    Shon Baton, MD 11/22/18 [phone]

## 2018-11-16 NOTE — ED Triage Notes (Signed)
Pt arrived via [facility]; pt from home with c/o SOB, pt was seen at PCP today with lab wor; hx of low iron and Hg; previous transfusions in past; Pt states that she feels lightheaded and SOB last few days; pt took Procardia and Metoprolol after taking medications; EMS reports 124/86, 84, upon standing HR increased; pt c/o retaining fluid in BLE CBG 163, 97% on RA

## 2018-11-17 ENCOUNTER — Emergency Department (HOSPITAL_COMMUNITY): Payer: Medicare HMO

## 2018-11-17 ENCOUNTER — Other Ambulatory Visit: Payer: Self-pay

## 2018-11-17 ENCOUNTER — Encounter (HOSPITAL_COMMUNITY): Payer: Self-pay | Admitting: Internal Medicine

## 2018-11-17 DIAGNOSIS — D649 Anemia, unspecified: Secondary | ICD-10-CM | POA: Diagnosis present

## 2018-11-17 DIAGNOSIS — I10 Essential (primary) hypertension: Secondary | ICD-10-CM

## 2018-11-17 DIAGNOSIS — D539 Nutritional anemia, unspecified: Secondary | ICD-10-CM | POA: Diagnosis present

## 2018-11-17 DIAGNOSIS — R634 Abnormal weight loss: Secondary | ICD-10-CM

## 2018-11-17 DIAGNOSIS — E119 Type 2 diabetes mellitus without complications: Secondary | ICD-10-CM

## 2018-11-17 DIAGNOSIS — E1159 Type 2 diabetes mellitus with other circulatory complications: Secondary | ICD-10-CM | POA: Diagnosis present

## 2018-11-17 LAB — CBC WITH DIFFERENTIAL/PLATELET
Abs Immature Granulocytes: 0 10*3/uL (ref 0.00–0.07)
BAND NEUTROPHILS: 0 %
BASOS ABS: 0 10*3/uL (ref 0.0–0.1)
BASOS PCT: 0 %
BLASTS: 0 %
Basophils Absolute: 0 10*3/uL (ref 0.0–0.1)
Basophils Relative: 0 %
EOS ABS: 0.2 10*3/uL (ref 0.0–0.5)
EOS PCT: 5 %
EOS PCT: 4 %
Eosinophils Absolute: 0.2 10*3/uL (ref 0.0–0.5)
HCT: 16 % — ABNORMAL LOW (ref 36.0–46.0)
HEMATOCRIT: 24.7 % — AB (ref 36.0–46.0)
HEMOGLOBIN: 8.3 g/dL — AB (ref 12.0–15.0)
Hemoglobin: 5.1 g/dL — CL (ref 12.0–15.0)
LYMPHS ABS: 1.2 10*3/uL (ref 0.7–4.0)
LYMPHS PCT: 22 %
Lymphocytes Relative: 32 %
Lymphs Abs: 0.9 10*3/uL (ref 0.7–4.0)
MCH: 38.9 pg — ABNORMAL HIGH (ref 26.0–34.0)
MCH: 34.7 pg — ABNORMAL HIGH (ref 26.0–34.0)
MCHC: 31.9 g/dL (ref 30.0–36.0)
MCHC: 33.6 g/dL (ref 30.0–36.0)
MCV: 122.1 fL — AB (ref 80.0–100.0)
MCV: 103.3 fL — ABNORMAL HIGH (ref 80.0–100.0)
MONOS PCT: 4 %
MYELOCYTES: 0 %
Metamyelocytes Relative: 0 %
Monocytes Absolute: 0.2 10*3/uL (ref 0.1–1.0)
Monocytes Absolute: 0.1 10*3/uL (ref 0.1–1.0)
Monocytes Relative: 3 %
NRBC: 0 % (ref 0.0–0.2)
Neutro Abs: 2.8 10*3/uL (ref 1.7–7.7)
Neutro Abs: 2.3 10*3/uL (ref 1.7–7.7)
Neutrophils Relative %: 69 %
Neutrophils Relative %: 61 %
OTHER: 0 %
PLATELETS: 151 10*3/uL (ref 150–400)
Platelets: 123 10*3/uL — ABNORMAL LOW (ref 150–400)
Promyelocytes Relative: 0 %
RBC: 1.31 MIL/uL — AB (ref 3.87–5.11)
RBC: 2.39 MIL/uL — ABNORMAL LOW (ref 3.87–5.11)
RDW: 17 % — ABNORMAL HIGH (ref 11.5–15.5)
RDW: 21.6 % — ABNORMAL HIGH (ref 11.5–15.5)
WBC: 4.1 10*3/uL (ref 4.0–10.5)
WBC: 3.8 10*3/uL — ABNORMAL LOW (ref 4.0–10.5)
nRBC: 0 /100 WBC
nRBC: 0 % (ref 0.0–0.2)
nRBC: 1 /100 WBC — ABNORMAL HIGH

## 2018-11-17 LAB — CBC
HCT: 15.8 % — ABNORMAL LOW (ref 36.0–46.0)
Hemoglobin: 5 g/dL — CL (ref 12.0–15.0)
MCH: 38.2 pg — ABNORMAL HIGH (ref 26.0–34.0)
MCHC: 31.6 g/dL (ref 30.0–36.0)
MCV: 120.6 fL — ABNORMAL HIGH (ref 80.0–100.0)
Platelets: 142 10*3/uL — ABNORMAL LOW (ref 150–400)
RBC: 1.31 MIL/uL — ABNORMAL LOW (ref 3.87–5.11)
RDW: 16.7 % — ABNORMAL HIGH (ref 11.5–15.5)
WBC: 4.1 10*3/uL (ref 4.0–10.5)
nRBC: 0.7 % — ABNORMAL HIGH (ref 0.0–0.2)

## 2018-11-17 LAB — COMPREHENSIVE METABOLIC PANEL
ALT: 17 U/L (ref 0–44)
ANION GAP: 11 (ref 5–15)
AST: 18 U/L (ref 15–41)
Albumin: 3.5 g/dL (ref 3.5–5.0)
Alkaline Phosphatase: 54 U/L (ref 38–126)
BILIRUBIN TOTAL: 0.6 mg/dL (ref 0.3–1.2)
BUN: 18 mg/dL (ref 8–23)
CALCIUM: 8.9 mg/dL (ref 8.9–10.3)
CO2: 21 mmol/L — ABNORMAL LOW (ref 22–32)
Chloride: 106 mmol/L (ref 98–111)
Creatinine, Ser: 1.05 mg/dL — ABNORMAL HIGH (ref 0.44–1.00)
GFR, EST AFRICAN AMERICAN: 59 mL/min — AB (ref >60)
GFR, EST NON AFRICAN AMERICAN: 51 mL/min — AB (ref >60)
GLUCOSE: 120 mg/dL — AB (ref 70–99)
Potassium: 3.6 mmol/L (ref 3.5–5.1)
SODIUM: 138 mmol/L (ref 135–145)
TOTAL PROTEIN: 6 g/dL — AB (ref 6.5–8.1)

## 2018-11-17 LAB — PREPARE RBC (CROSSMATCH)

## 2018-11-17 LAB — URINALYSIS, ROUTINE W REFLEX MICROSCOPIC
BACTERIA UA: NONE SEEN
Bilirubin Urine: NEGATIVE
GLUCOSE, UA: NEGATIVE mg/dL
KETONES UR: NEGATIVE mg/dL
LEUKOCYTES UA: NEGATIVE
NITRITE: NEGATIVE
PROTEIN: NEGATIVE mg/dL
Specific Gravity, Urine: 1.01 (ref 1.005–1.030)
pH: 6 (ref 5.0–8.0)

## 2018-11-17 LAB — RETICULOCYTES
Immature Retic Fract: 15.9 % (ref 2.3–15.9)
RBC.: 1.32 MIL/uL — ABNORMAL LOW (ref 3.87–5.11)
Retic Count, Absolute: 56.1 10*3/uL (ref 19.0–186.0)
Retic Ct Pct: 4.3 % — ABNORMAL HIGH (ref 0.4–3.1)

## 2018-11-17 LAB — VITAMIN B12: Vitamin B-12: <50 pg/mL — ABNORMAL LOW (ref 180–914)

## 2018-11-17 LAB — FOLATE: FOLATE: 34.9 ng/mL (ref >5.9)

## 2018-11-17 LAB — FERRITIN: Ferritin: 7 ng/mL — ABNORMAL LOW (ref 11–307)

## 2018-11-17 LAB — GLUCOSE, CAPILLARY
Glucose-Capillary: 118 mg/dL — ABNORMAL HIGH (ref 70–99)
Glucose-Capillary: 115 mg/dL — ABNORMAL HIGH (ref 70–99)

## 2018-11-17 LAB — IRON AND TIBC
Iron: 17 ug/dL — ABNORMAL LOW (ref 28–170)
Saturation Ratios: 4 % — ABNORMAL LOW (ref 10.4–31.8)
TIBC: 465 ug/dL — ABNORMAL HIGH (ref 250–450)
UIBC: 448 ug/dL

## 2018-11-17 LAB — POC OCCULT BLOOD, ED: Fecal Occult Bld: NEGATIVE

## 2018-11-17 LAB — BRAIN NATRIURETIC PEPTIDE: B Natriuretic Peptide: 106.6 pg/mL — ABNORMAL HIGH (ref 0.0–100.0)

## 2018-11-17 MED ORDER — PANTOPRAZOLE SODIUM 40 MG PO TBEC
40.0000 mg | DELAYED_RELEASE_TABLET | Freq: Every day | ORAL | Status: DC
  Administered 2018-11-17 – 2018-11-18 (×2): 40 mg via ORAL
  Filled 2018-11-17 (×2): qty 1

## 2018-11-17 MED ORDER — SIMVASTATIN 20 MG PO TABS
20.0000 mg | ORAL_TABLET | Freq: Every day | ORAL | Status: DC
  Administered 2018-11-17: 20 mg via ORAL
  Filled 2018-11-17: qty 1

## 2018-11-17 MED ORDER — ENOXAPARIN SODIUM 40 MG/0.4ML ~~LOC~~ SOLN
40.0000 mg | Freq: Every day | SUBCUTANEOUS | Status: DC
  Administered 2018-11-17 – 2018-11-18 (×3): 40 mg via SUBCUTANEOUS
  Filled 2018-11-17 (×3): qty 0.4

## 2018-11-17 MED ORDER — HYDROCHLOROTHIAZIDE 25 MG PO TABS
25.0000 mg | ORAL_TABLET | Freq: Every day | ORAL | Status: DC
  Administered 2018-11-17 – 2018-11-18 (×2): 25 mg via ORAL
  Filled 2018-11-17 (×2): qty 1

## 2018-11-17 MED ORDER — METFORMIN HCL ER 500 MG PO TB24
500.0000 mg | ORAL_TABLET | Freq: Every day | ORAL | Status: DC
  Administered 2018-11-17 – 2018-11-18 (×2): 500 mg via ORAL
  Filled 2018-11-17 (×2): qty 1

## 2018-11-17 MED ORDER — SODIUM CHLORIDE 0.9 % IV SOLN
510.0000 mg | INTRAVENOUS | Status: DC
  Administered 2018-11-17: 510 mg via INTRAVENOUS
  Filled 2018-11-17: qty 17

## 2018-11-17 MED ORDER — NAPHAZOLINE-GLYCERIN 0.03-0.5 % OP SOLN
1.0000 [drp] | Freq: Four times a day (QID) | OPHTHALMIC | Status: DC | PRN
  Filled 2018-11-17: qty 15

## 2018-11-17 MED ORDER — ENSURE ENLIVE PO LIQD
237.0000 mL | Freq: Two times a day (BID) | ORAL | Status: DC
  Administered 2018-11-18: 237 mL via ORAL

## 2018-11-17 MED ORDER — ACETAMINOPHEN 325 MG PO TABS: 650.0000 mg | ORAL_TABLET | Freq: Four times a day (QID) | ORAL | Status: DC | PRN

## 2018-11-17 MED ORDER — SODIUM CHLORIDE 0.9% IV SOLUTION
Freq: Once | INTRAVENOUS | Status: AC
  Administered 2018-11-17: 09:00:00 via INTRAVENOUS

## 2018-11-17 MED ORDER — ONDANSETRON HCL 4 MG PO TABS: 4.0000 mg | ORAL_TABLET | Freq: Four times a day (QID) | ORAL | Status: DC | PRN

## 2018-11-17 MED ORDER — METOPROLOL SUCCINATE ER 25 MG PO TB24
25.0000 mg | ORAL_TABLET | Freq: Every day | ORAL | Status: DC
  Administered 2018-11-17 – 2018-11-18 (×2): 25 mg via ORAL
  Filled 2018-11-17 (×2): qty 1

## 2018-11-17 MED ORDER — CYANOCOBALAMIN 1000 MCG/ML IJ SOLN
1000.0000 ug | Freq: Every day | INTRAMUSCULAR | Status: DC
  Administered 2018-11-18: 1000 ug via INTRAMUSCULAR
  Filled 2018-11-17 (×2): qty 1

## 2018-11-17 MED ORDER — ASPIRIN 81 MG PO CHEW
81.0000 mg | CHEWABLE_TABLET | Freq: Every day | ORAL | Status: DC
  Administered 2018-11-17 – 2018-11-18 (×2): 81 mg via ORAL
  Filled 2018-11-17 (×2): qty 1

## 2018-11-17 MED ORDER — FERROUS SULFATE 325 (65 FE) MG PO TABS
325.0000 mg | ORAL_TABLET | Freq: Three times a day (TID) | ORAL | Status: DC
  Administered 2018-11-18: 325 mg via ORAL
  Filled 2018-11-17: qty 1

## 2018-11-17 MED ORDER — ACETAMINOPHEN 650 MG RE SUPP: 650.0000 mg | Freq: Four times a day (QID) | RECTAL | Status: DC | PRN

## 2018-11-17 MED ORDER — ONDANSETRON HCL 4 MG/2ML IJ SOLN: 4.0000 mg | Freq: Four times a day (QID) | INTRAMUSCULAR | Status: DC | PRN

## 2018-11-17 MED ORDER — LISINOPRIL 20 MG PO TABS
20.0000 mg | ORAL_TABLET | Freq: Every day | ORAL | Status: DC
  Administered 2018-11-17: 20 mg via ORAL
  Filled 2018-11-17 (×2): qty 1

## 2018-11-17 MED ORDER — IRON 325 (65 FE) MG PO TABS: 325.0000 mg | ORAL_TABLET | Freq: Three times a day (TID) | ORAL | Status: DC

## 2018-11-17 NOTE — Progress Notes (Signed)
New Admission Note:   Arrival Method: Bed  Mental Orientation: alert and oriented x4  Telemetry: Assessment: Completed Skin: intact  IV: two  peripherial IV in right hand  Pain:0/10  Tubes:none  Safety Measures: Safety Fall Prevention Plan has been given, discussed and signed Admission: Completed [facility] Orientation: Patient has been orientated to the room, unit and staff.  Family:Daughter   Orders have been reviewed and implemented. Will continue to monitor the patient. Call light has been placed within reach and bed alarm has been activated.   Deadrick Stidd RN [facility] Phone: [phone]

## 2018-11-17 NOTE — H&P (Signed)
History and Physical    Jasmin EdmanCarrie Priego ZOX:[id number] DOB: Jul 28, 1941 DOA: 11/16/2018  PCP: Lindaann PascalLong, Scott, PA-C  Patient coming from: [facility]  I have personally briefly reviewed patient's old medical records [facility]  Chief Complaint: SOB  HPI: Jasmin Miller is a 77 y.o. female with medical history significant of HTN, MI, DM2, Anemia.  Patient presents to the ED with c/o SOB and low BP.  Patient reports unintentional wt loss and decreased appetite over the past couple of months.  20lb over past year.  Pt with h/o anemia.  Started on iron pills for what was believed to be iron def anemia with HGB of 7.8 in July.  Since then she has had some constipation which caused her to stop iron pills.  No hematochezia nor melena.  Have had to go down on BP meds due to low BP recently.  ED Course: HGB 5.1.  Hemoccult neg (both here and at PCPs office earlier in day).  Interestingly patient has MACROcytic anemia with MCH of 122.1  2u PRBC transfusion ordered.  Anemia pnl being drawn now.   Review of Systems: As per HPI otherwise 10 point review of systems negative.   Past Medical History:  Diagnosis Date  . DM2 (diabetes mellitus, type 2) (HCC)   . Hypertension   . MI (myocardial infarction) (HCC)     History reviewed. No pertinent surgical history.   reports that she has never smoked. She has never used smokeless tobacco. She reports that she does not drink alcohol or use drugs.  No Known Allergies  Family History  Problem Relation Age of Onset  . Anemia Mother   . Anemia Daughter      Prior to Admission medications   Medication Sig Start Date End Date Taking? Authorizing Provider  aspirin EC 81 MG tablet Take 81 mg by mouth daily.    [provider]  Ferrous Sulfate (IRON) 325 (65 FE) MG TABS Take 325 mg by mouth daily.    [provider]  lansoprazole (PREVACID) 30 MG capsule Take 30 mg by mouth daily.     [provider]  lidocaine  (XYLOCAINE) 2 % solution Use as directed 5-10 mLs in the mouth or throat as needed for mouth pain. 01/24/18   Ofilia Neaslark, Michael L, PA-C  metFORMIN (GLUCOPHAGE) 500 MG tablet Take by mouth 2 (two) times daily with a meal.    [provider]  metoprolol succinate (TOPROL-XL) 50 MG 24 hr tablet Take 1 tablet (50 mg total) by mouth daily. Take with or immediately following a meal. 10/03/15   Roxy HorsemanBrowning, Robert, PA-C  Naphazoline-Glycerin (CLEAR EYES MAX REDNESS RELIEF) 0.03-0.5 % SOLN Place 1 drop into both eyes 4 (four) times daily as needed (For eye redness or irritation.).    [provider]  naproxen sodium (ALEVE) 220 MG tablet Take 220 mg by mouth every 8 (eight) hours as needed (For pain.).    [provider]  NIFEdipine (PROCARDIA-XL/ADALAT CC) 30 MG 24 hr tablet Take 1 tablet (30 mg total) by mouth every evening. 10/03/15   Roxy HorsemanBrowning, Robert, PA-C  simvastatin (ZOCOR) 20 MG tablet Take 20 mg by mouth daily.    [provider]  triamcinolone cream (KENALOG) 0.1 % Apply 1 application topically daily. Applies to elbows for dry skin.    [provider]    Physical Exam: Vitals:   11/17/18 0015 11/17/18 0030 11/17/18 0045 11/17/18 0230  BP: 123/62 (!) 120/56 (!) 115/56 (!) 128/59  Pulse: 94  87 86 91  Resp: 15 (!) 21 16 19   Temp:      TempSrc:      SpO2: 100% 100% 100% 100%    Constitutional: NAD, calm, comfortable Eyes: PERRL, lids and conjunctivae normal ENMT: Mucous membranes are moist. Posterior pharynx clear of any exudate or lesions.Normal dentition.  Neck: normal, supple, no masses, no thyromegaly Respiratory: clear to auscultation bilaterally, no wheezing, no crackles. Normal respiratory effort. No accessory muscle use.  Cardiovascular: Regular rate and rhythm, no murmurs / rubs / gallops. No extremity edema. 2+ pedal pulses. No carotid bruits.  Abdomen: no tenderness, no masses palpated. No hepatosplenomegaly. Bowel sounds positive.    Musculoskeletal: no clubbing / cyanosis. No joint deformity upper and lower extremities. Good ROM, no contractures. Normal muscle tone.  Skin: no rashes, lesions, ulcers. No induration Neurologic: CN 2-12 grossly intact. Sensation intact, DTR normal. Strength 5/5 in all 4.  Psychiatric: Normal judgment and insight. Alert and oriented x 3. Normal mood.    Labs on Admission: I have personally reviewed following labs and imaging studies  CBC: Recent Labs  Lab 11/17/18 0111  WBC 4.1  NEUTROABS 2.8  HGB 5.1*  HCT 16.0*  MCV 122.1*  PLT 151   Basic Metabolic Panel: Recent Labs  Lab 11/17/18 0111  NA 138  K 3.6  CL 106  CO2 21*  GLUCOSE 120*  BUN 18  CREATININE 1.05*  CALCIUM 8.9   GFR: CrCl cannot be calculated (Unknown ideal weight.). Liver Function Tests: Recent Labs  Lab 11/17/18 0111  AST 18  ALT 17  ALKPHOS 54  BILITOT 0.6  PROT 6.0*  ALBUMIN 3.5   No results for input(s): LIPASE, AMYLASE in the last 168 hours. No results for input(s): AMMONIA in the last 168 hours. Coagulation Profile: No results for input(s): INR, PROTIME in the last 168 hours. Cardiac Enzymes: No results for input(s): CKTOTAL, CKMB, CKMBINDEX, TROPONINI in the last 168 hours. BNP (last 3 results) No results for input(s): PROBNP in the last 8760 hours. HbA1C: No results for input(s): HGBA1C in the last 72 hours. CBG: No results for input(s): GLUCAP in the last 168 hours. Lipid Profile: No results for input(s): CHOL, HDL, LDLCALC, TRIG, CHOLHDL, LDLDIRECT in the last 72 hours. Thyroid Function Tests: No results for input(s): TSH, T4TOTAL, FREET4, T3FREE, THYROIDAB in the last 72 hours. Anemia Panel: No results for input(s): VITAMINB12, FOLATE, FERRITIN, TIBC, IRON, RETICCTPCT in the last 72 hours. Urine analysis:    Component Value Date/Time   COLORURINE STRAW (A) 11/17/2018 0126   APPEARANCEUR CLEAR 11/17/2018 0126   LABSPEC 1.010 11/17/2018 0126   PHURINE 6.0 11/17/2018 0126    GLUCOSEU NEGATIVE 11/17/2018 0126   HGBUR SMALL (A) 11/17/2018 0126   BILIRUBINUR NEGATIVE 11/17/2018 0126   KETONESUR NEGATIVE 11/17/2018 0126   PROTEINUR NEGATIVE 11/17/2018 0126   UROBILINOGEN 0.2 10/03/2015 1313   NITRITE NEGATIVE 11/17/2018 0126   LEUKOCYTESUR NEGATIVE 11/17/2018 0126    Radiological Exams on Admission: Dg Chest 2 View  Result Date: 11/17/2018 CLINICAL DATA:  Dyspnea for the past 2 days. EXAM: CHEST - 2 VIEW COMPARISON:  None. FINDINGS: Normal sized heart. Clear lungs with normal vascularity. Tortuous and partially calcified thoracic aorta. Diffuse osteopenia. Bilateral shoulder degenerative changes and superior migration of the humeral heads. Thoracic spine degenerative changes. IMPRESSION: 1. No acute abnormality. 2. 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 Electronically Signed   By: Beckie Salts M.D.   On: 11/17/2018 01:54  EKG: Independently reviewed.  Assessment/Plan Principal Problem:   Macrocytic anemia Active Problems:   Symptomatic anemia   Unintentional weight loss   HTN (hypertension)   DM2 (diabetes mellitus, type 2) (HCC)    1. Symptomatic anemia - 1. Transfuse 2u PRBC 2. Repeat CBC in AM 3. Hemoccult neg, no obvious bleeding source 2. Macrocytic anemia - 1. Interestingly patient has macrocytic anemia 2. Anemia pnl pending, but I wouldn't expect an MCH of 122 from iron def anemia alone. 3. If no folate or B12 deficiency, then may need hematology consult. 3. HTN - 1. Continue home BP meds: metoprolol and Lisinopril-HCTZ 2. Nifedipine has been placed on hold by PCP recently due to low BPs, will continue to hold this 4. DM2 - continue Metformin  DVT prophylaxis: Lovenox Code Status: Full Family Communication: No family in room Disposition Plan: Home after admit Consults called: None Admission status: Place in obs    Bradee Common, Heywood Iles. DO [facility] Pager [phone]  Only works nights!  If 7AM-7PM, please contact the primary day team physician taking care of patient  [email or web address] Password [email or web address]  11/17/2018, 3:16 AM

## 2018-11-17 NOTE — ED Notes (Signed)
Report attempted 

## 2018-11-17 NOTE — Progress Notes (Signed)
PROGRESS NOTE                                                                                                                                                                                                             Patient Demographics:    Jasmin Miller, is a 77 y.o. female, DOB - 1941/03/05, ZHY:[id number]  Admit date - 11/16/2018   Admitting Physician No admitting provider for patient encounter.  Outpatient Primary MD for the patient is Long, Lorin Picket, PA-C  LOS - 0  Chief Complaint  Patient presents with  . Shortness of Breath       Brief Narrative  Jasmin Miller is a 77 y.o. female with medical history significant of HTN, MI, DM2, Chronic Anemia.  Patient presents to the ED with c/o SOB and low BP. Patient reports unintentional wt loss and decreased appetite over the past couple of months.  20lb over past year. Pt with h/o anemia.  Started on iron pills for what was believed to be iron def anemia with HGB of 7.8 in July.  Since then she has had some constipation which caused her to stop iron pills.  Went to see her PCP for excessive fatigue and shortness of breath and blood work revealed severe anemia and she was sent to the ER.   Subjective:    Jasmin Miller today has, No headache, No chest pain, No abdominal pain - No Nausea, No new weakness tingling or numbness, No Cough - SOB.     Assessment  & Plan :     1.  Symptomatic anemia which is combination of iron and B12 deficiency.  Question diet versus absorption, she also reports some unintentional weight loss, for now she will get 2 units of packed RBC transfused, IV iron and IM B12, will request PCP to definitely arrange for outpatient GI follow-up and do weight loss work-up. Fecal Occult blood negative.  No history of melena or bright red blood per rectum.  2. HTN - on Lopressor, ACE inhibitor and HCTZ combination which will be continued.  3.  Dyslipidemia - stable on statin.  4.  GERD.  On PPI.    Family  Communication  :  None  Code Status :  Full  Disposition Plan  :  Home in am  Consults  :  None  Procedures  :    DVT Prophylaxis  :  Lovenox   Lab Results  Component Value Date   PLT 142 (L) 11/17/2018    Diet :  Diet Order            Diet Carb Modified Fluid consistency: Thin; Room service appropriate? Yes  Diet effective now               Inpatient Medications Scheduled Meds: . aspirin  81 mg Oral Daily  . enoxaparin (LOVENOX) injection  40 mg Subcutaneous Daily  . hydrochlorothiazide  25 mg Oral Daily  . Iron  325 mg Oral TID  . lisinopril  20 mg Oral Daily  . metFORMIN  500 mg Oral Q breakfast  . metoprolol succinate  25 mg Oral Daily  . pantoprazole  40 mg Oral Daily  . simvastatin  20 mg Oral Daily   Continuous Infusions: PRN Meds:.acetaminophen **OR** acetaminophen, Naphazoline-Glycerin, ondansetron **OR** ondansetron (ZOFRAN) IV  Antibiotics  :   Anti-infectives (From admission, onward)   None        Objective:   Vitals:   11/17/18 0919 11/17/18 0930 11/17/18 0945 11/17/18 1000  BP: (!) 132/52 (!) 129/53 129/73 (!) 134/58  Pulse: 90 88 88 84  Resp: 18 20 17 18   Temp:      TempSrc:      SpO2: 100% 98% 100% 100%    Wt Readings from Last 3 Encounters:  10/03/15 78.5 kg     Intake/Output Summary (Last 24 hours) at 11/17/2018 1105 Last data filed at 11/17/2018 0859 Gross per 24 hour  Intake 945 ml  Output -  Net 945 ml     Physical Exam  Awake Alert, Oriented X 3, No new F.N deficits, Normal affect [place].AT,PERRAL Supple Neck,No JVD, No cervical lymphadenopathy appriciated.  Symmetrical Chest wall movement, Good air movement bilaterally, CTAB RRR,No Gallops,Rubs or new Murmurs, No Parasternal Heave +ve B.Sounds, Abd Soft, No tenderness, No organomegaly appriciated, No rebound - guarding or rigidity. No Cyanosis, Clubbing or edema, No new Rash or bruise     Data Review:    CBC Recent Labs  Lab 11/17/18 0111 11/17/18 0327    WBC 4.1 4.1  HGB 5.1* 5.0*  HCT 16.0* 15.8*  PLT 151 142*  MCV 122.1* 120.6*  MCH 38.9* 38.2*  MCHC 31.9 31.6  RDW 17.0* 16.7*  LYMPHSABS 0.9  --   MONOABS 0.2  --   EOSABS 0.2  --   BASOSABS 0.0  --     Chemistries  Recent Labs  Lab 11/17/18 0111  NA 138  K 3.6  CL 106  CO2 21*  GLUCOSE 120*  BUN 18  CREATININE 1.05*  CALCIUM 8.9  AST 18  ALT 17  ALKPHOS 54  BILITOT 0.6   ------------------------------------------------------------------------------------------------------------------ No results for input(s): CHOL, HDL, LDLCALC, TRIG, CHOLHDL, LDLDIRECT in the last 72 hours.  No results found for: HGBA1C ------------------------------------------------------------------------------------------------------------------ No results for input(s): TSH, T4TOTAL, T3FREE, THYROIDAB in the last 72 hours.  Invalid input(s): FREET3 ------------------------------------------------------------------------------------------------------------------ Recent Labs    11/17/18 0308 11/17/18 0309  VITAMINB12 <50*  --   FOLATE  --  34.9  FERRITIN 7*  --   TIBC 465*  --   IRON 17*  --   RETICCTPCT 4.3*  --     Coagulation profile No results for input(s): INR, PROTIME in the last 168 hours.  No results for input(s): DDIMER in the last 72 hours.  Cardiac Enzymes No results for input(s): CKMB, TROPONINI, MYOGLOBIN in the last 168 hours.  Invalid input(s): CK ------------------------------------------------------------------------------------------------------------------    Component Value Date/Time   BNP 106.6 (H) 11/17/2018 0112    Micro Results No results  found for this or any previous visit (from the past 240 hour(s)).  Radiology Reports Dg Chest 2 View  Result Date: 11/17/2018 CLINICAL DATA:  Dyspnea for the past 2 days. EXAM: CHEST - 2 VIEW COMPARISON:  None. FINDINGS: Normal sized heart. Clear lungs with normal vascularity. Tortuous and partially calcified  thoracic aorta. Diffuse osteopenia. Bilateral shoulder degenerative changes and superior migration of the humeral heads. Thoracic spine degenerative changes. IMPRESSION: 1. No acute abnormality. 2. 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 Electronically Signed   By: Beckie SaltsSteven  Reid M.D.   On: 11/17/2018 01:54    Time Spent in minutes  30   Susa RaringPrashant Brodee Mauritz M.D on 11/17/2018 at 11:05 AM  To page go to [email or web address] - password [email or web address]

## 2018-11-18 DIAGNOSIS — D539 Nutritional anemia, unspecified: Secondary | ICD-10-CM | POA: Diagnosis not present

## 2018-11-18 LAB — BASIC METABOLIC PANEL
Anion gap: 8 (ref 5–15)
BUN: 14 mg/dL (ref 8–23)
CO2: 26 mmol/L (ref 22–32)
Calcium: 8.8 mg/dL — ABNORMAL LOW (ref 8.9–10.3)
Chloride: 106 mmol/L (ref 98–111)
Creatinine, Ser: 1.14 mg/dL — ABNORMAL HIGH (ref 0.44–1.00)
GFR calc Af Amer: 54 mL/min — ABNORMAL LOW (ref >60)
GFR calc non Af Amer: 46 mL/min — ABNORMAL LOW (ref >60)
Glucose, Bld: 93 mg/dL (ref 70–99)
Potassium: 3.9 mmol/L (ref 3.5–5.1)
Sodium: 140 mmol/L (ref 135–145)

## 2018-11-18 LAB — CBC
HCT: 25.5 % — ABNORMAL LOW (ref 36.0–46.0)
Hemoglobin: 8.5 g/dL — ABNORMAL LOW (ref 12.0–15.0)
MCH: 34.4 pg — ABNORMAL HIGH (ref 26.0–34.0)
MCHC: 33.3 g/dL (ref 30.0–36.0)
MCV: 103.2 fL — ABNORMAL HIGH (ref 80.0–100.0)
Platelets: 131 10*3/uL — ABNORMAL LOW (ref 150–400)
RBC: 2.47 MIL/uL — ABNORMAL LOW (ref 3.87–5.11)
RDW: 22.2 % — ABNORMAL HIGH (ref 11.5–15.5)
WBC: 3.7 10*3/uL — ABNORMAL LOW (ref 4.0–10.5)
nRBC: 0 % (ref 0.0–0.2)

## 2018-11-18 LAB — MAGNESIUM: MAGNESIUM: 2 mg/dL (ref 1.7–2.4)

## 2018-11-18 LAB — GLUCOSE, CAPILLARY: Glucose-Capillary: 84 mg/dL (ref 70–99)

## 2018-11-18 MED ORDER — FERROUS SULFATE 325 (65 FE) MG PO TABS: 325.0000 mg | ORAL_TABLET | Freq: Three times a day (TID) | ORAL | 0 refills | Status: DC

## 2018-11-18 MED ORDER — CYANOCOBALAMIN 1000 MCG/ML IJ SOLN: INTRAMUSCULAR | 0 refills | Status: DC

## 2018-11-18 NOTE — Discharge Instructions (Signed)
Follow with Primary MD Long, Scott, PA-C in 7 days   Get CBC, CMP, Iron panel, B12 levels, -  checked  by Primary MD in 5-7 days   Activity: As tolerated with Full fall precautions use walker/cane & assistance as needed  Disposition Home   Diet: Heart Healthy Low Carb.  Special Instructions: If you have smoked or chewed Tobacco  in the last 2 yrs please stop smoking, stop any regular Alcohol  and or any Recreational drug use.  On your next visit with your primary care physician please Get Medicines reviewed and adjusted.  Please request your Prim.MD to go over all Hospital Tests and Procedure/Radiological results at the follow up, please get all Hospital records sent to your Prim MD by signing hospital release before you go home.  If you experience worsening of your admission symptoms, develop shortness of breath, life threatening emergency, suicidal or homicidal thoughts you must seek medical attention immediately by calling 911 or calling your MD immediately  if symptoms less severe.  You Must read complete instructions/literature along with all the possible adverse reactions/side effects for all the Medicines you take and that have been prescribed to you. Take any new Medicines after you have completely understood and accpet all the possible adverse reactions/side effects.

## 2018-11-18 NOTE — Discharge Summary (Signed)
Jasmin Miller WJX:[id number] DOB: [birth year] DOA: 11/16/2018  PCP: Lindaann Pascal, PA-C  Admit date: 11/16/2018  Discharge date: 11/18/2018  Admitted From: Home   Disposition:  Home   Recommendations for Outpatient Follow-up:   Follow up with PCP in 1-2 weeks  PCP Please obtain BMP/CBC, 2 view CXR in 1week,  (see Discharge instructions)   PCP Please follow up on the following pending results: Anemia panel and B12 levels   Home Health: None  Equipment/Devices: None  Consultations: None Discharge Condition: Stable   CODE STATUS: Full   Diet Recommendation: Heart Healthy Low Carb     Chief Complaint  Patient presents with  . Shortness of Breath     Brief history of present illness from the day of admission and additional interim summary    Jasmin Miller a 77 y.o.femalewith medical history significant ofHTN, MI, DM2, Chronic Anemia. Patient presents to the ED with c/o SOB and low BP. Patient reports unintentional wt loss and decreased appetite over the past couple of months. 20lb over past year. Pt with h/o anemia. Started on iron pills for what was believed to be iron def anemia with HGB of 7.8 in July. Since then she has had some constipation which caused her to stop iron pills.  Went to see her PCP for excessive fatigue and shortness of breath and blood work revealed severe anemia and she was sent to the ER.                                                                 Hospital Course    1.  Symptomatic anemia which is combination of iron and B12 deficiency.  Question diet versus malabsorption, she also reports some unintentional weight loss, No history of melena or bright red blood per rectum.  He received 2 units of packed RBC transfusion with stable posttransfusion H&H, fecal occult blood was  negative, she is already on PPI which will be continued, she received B12 shots here and will get B12 shots to go home with along with oral iron supplementation.  Request PCP to do age-appropriate outpatient iron deficiency and B12 deficiency anemia work-up along with weight loss work-up.  I have recommended patient to follow with GI 1 time in the next 1 to 2 weeks.  She is now symptom-free and eager to go home.  2. HTN - on Lopressor, ACE inhibitor and HCTZ combination which will be continued.  3.  Dyslipidemia - stable on statin.  4.  GERD.  On PPI.   Discharge diagnosis     Principal Problem:   Macrocytic anemia Active Problems:   Symptomatic anemia   Unintentional weight loss   HTN (hypertension)   DM2 (diabetes mellitus, type 2) [facility])    Discharge instructions    Discharge Instructions  Discharge instructions   Complete by:  As directed    Follow with Primary MD Long, Scott, PA-C in 7 days   Get CBC, CMP, Iron panel, B12 levels, -  checked  by Primary MD in 5-7 days   Activity: As tolerated with Full fall precautions use walker/cane & assistance as needed  Disposition Home   Diet: Heart Healthy Low Carb.  Special Instructions: If you have smoked or chewed Tobacco  in the last 2 yrs please stop smoking, stop any regular Alcohol  and or any Recreational drug use.  On your next visit with your primary care physician please Get Medicines reviewed and adjusted.  Please request your Prim.MD to go over all Hospital Tests and Procedure/Radiological results at the follow up, please get all Hospital records sent to your Prim MD by signing hospital release before you go home.  If you experience worsening of your admission symptoms, develop shortness of breath, life threatening emergency, suicidal or homicidal thoughts you must seek medical attention immediately by calling 911 or calling your MD immediately  if symptoms less severe.  You Must read complete  instructions/literature along with all the possible adverse reactions/side effects for all the Medicines you take and that have been prescribed to you. Take any new Medicines after you have completely understood and accpet all the possible adverse reactions/side effects.   Increase activity slowly   Complete by:  As directed       Discharge Medications   Allergies as of 11/18/2018   No Known Allergies     Medication List    TAKE these medications   ALEVE 220 MG tablet Generic drug:  naproxen sodium Take 220 mg by mouth every 8 (eight) hours as needed (For pain.).   aspirin EC 81 MG tablet Take 81 mg by mouth daily.   CLEAR EYES MAX REDNESS RELIEF 0.03-0.5 % Soln Generic drug:  Naphazoline-Glycerin Place 1 drop into both eyes 4 (four) times daily as needed (For eye redness or irritation.).   cyanocobalamin 1000 MCG/ML injection Commonly known as:  (VITAMIN B-12) 6 month supply, 1ml SQ daily for 1 week then once SQ every 4 weeks. With 10 1cc BD syringes.   ferrous sulfate 325 (65 FE) MG tablet Take 1 tablet (325 mg total) by mouth 3 (three) times daily with meals. What changed:    medication strength  when to take this   lansoprazole 30 MG capsule Commonly known as:  PREVACID Take 30 mg by mouth daily.   lidocaine 2 % solution Commonly known as:  XYLOCAINE Use as directed 5-10 mLs in the mouth or throat as needed for mouth pain.   lisinopril-hydrochlorothiazide 20-25 MG tablet Commonly known as:  PRINZIDE,ZESTORETIC Take 1 tablet by mouth daily.   metFORMIN 500 MG 24 hr tablet Commonly known as:  GLUCOPHAGE-XR Take 500 mg by mouth daily with breakfast.   metoprolol succinate 50 MG 24 hr tablet Commonly known as:  TOPROL-XL Take 1 tablet (50 mg total) by mouth daily. Take with or immediately following a meal. What changed:  how much to take   NIFEdipine 30 MG 24 hr tablet Commonly known as:  ADALAT CC Take 1 tablet (30 mg total) by mouth every evening.     simvastatin 20 MG tablet Commonly known as:  ZOCOR Take 20 mg by mouth daily.   triamcinolone cream 0.1 % Commonly known as:  KENALOG Apply 1 application topically daily as needed (dry skin). Applies to elbows for dry skin.  Follow-up Information    Long, Lorin PicketScott, [place]PA-C. Schedule an appointment as soon as possible for a visit in 1 week(s).   Specialty:  Physician Assistant Contact information: [address] [place] [phone]        Iva BoopGessner, Carl E, MD. Schedule an appointment as soon as possible for a visit in 1 week(s).   Specialty:  Gastroenterology Why:  Weight loss, low B12 and Iron Contact information: [address] [place] [address] [phone]           Major procedures and Radiology Reports - PLEASE review detailed and final reports thoroughly  -         Dg Chest 2 View  Result Date: 11/17/2018 CLINICAL DATA:  Dyspnea for the past 2 days. EXAM: CHEST - 2 VIEW COMPARISON:  None. FINDINGS: Normal sized heart. Clear lungs with normal vascularity. Tortuous and partially calcified thoracic aorta. Diffuse osteopenia. Bilateral shoulder degenerative changes and superior migration of the humeral heads. Thoracic spine degenerative changes. IMPRESSION: 1. No acute abnormality. 2. 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 Electronically Signed   By: Beckie SaltsSteven  Reid M.D.   On: 11/17/2018 01:54    Micro Results     No results found for this or any previous visit (from the past 240 hour(s)).  Today   Subjective    Jasmin Miller today has no headache,no chest abdominal pain,no new weakness tingling or numbness, feels much better wants to go home today.     Objective   Blood pressure (!) 159/77, pulse 79, temperature 98.2 F (36.8 C), temperature source Oral, resp. rate 18, SpO2 98 %.   Intake/Output Summary (Last 24 hours) at 11/18/2018 1019 Last data filed at 11/18/2018  0546 Gross per 24 hour  Intake 415 ml  Output 1 ml  Net 414 ml    Exam  Awake Alert, Oriented x 3, No new F.N deficits, Normal affect Kimberly.AT,PERRAL Supple Neck,No JVD, No cervical lymphadenopathy appriciated.  Symmetrical Chest wall movement, Good air movement bilaterally, CTAB RRR,No Gallops,Rubs or new Murmurs, No Parasternal Heave +ve B.Sounds, Abd Soft, Non tender, No organomegaly appriciated, No rebound -guarding or rigidity. No Cyanosis, Clubbing or edema, No new Rash or bruise   Data Review   CBC w Diff:  Lab Results  Component Value Date   WBC 3.7 (L) 11/18/2018   HGB 8.5 (L) 11/18/2018   HCT 25.5 (L) 11/18/2018   PLT 131 (L) 11/18/2018   LYMPHOPCT 32 11/17/2018   BANDSPCT 0 11/17/2018   MONOPCT 3 11/17/2018   EOSPCT 4 11/17/2018   BASOPCT 0 11/17/2018    CMP:  Lab Results  Component Value Date   NA 140 11/18/2018   K 3.9 11/18/2018   CL 106 11/18/2018   CO2 26 11/18/2018   BUN 14 11/18/2018   CREATININE 1.14 (H) 11/18/2018   PROT 6.0 (L) 11/17/2018   ALBUMIN 3.5 11/17/2018   BILITOT 0.6 11/17/2018   ALKPHOS 54 11/17/2018   AST 18 11/17/2018   ALT 17 11/17/2018    Total Time in preparing paper work, data evaluation and todays exam - 35 minutes  Susa RaringPrashant Len Azeez M.D on 11/18/2018 at 10:19 AM  [facility]   Office  [phone]

## 2018-11-18 NOTE — Care Management Note (Signed)
Case Management Note  Patient Details  Name: Jasmin Miller MRN: [id number] Date of Birth: 03-Jan-1941  Subjective/Objective:          macrocytic anemia          Action/Plan: PTA home, independent. No home needs identified.   Expected Discharge Date:  11/18/18               Expected Discharge Plan:  Home/Self Care  In-House Referral:  NA  Discharge planning Services  NA  Post Acute Care Choice:  NA Choice offered to:  NA  DME Arranged:  N/A DME Agency:  NA  HH Arranged:  NA HH Agency:  NA  Status of Service:  Completed, signed off  If discussed at Long Length of Stay Meetings, dates discussed:    Additional Comments:  Bess KindsWendi B Lamarkus Nebel, RN 11/18/2018, 10:51 AM

## 2018-11-18 NOTE — Progress Notes (Signed)
Patient discharged to home. After visit Summary reviewed. Patient capable of reverbalizing medications and follow up visits. No signs and symptoms of distress noted. Patient educated to return to the ED in the case of an emergency. Jackson Coffield RN 

## 2018-11-18 NOTE — Care Management Obs Status (Signed)
MEDICARE OBSERVATION STATUS NOTIFICATION   Patient Details  Name: Jasmin EdmanCarrie Spohr MRN: [id number] Date of Birth: 07-14-1941   Medicare Observation Status Notification Given:  Yes    Bess KindsWendi B Collie Wernick, RN 11/18/2018, 10:45 AM

## 2018-11-19 LAB — TYPE AND SCREEN
ABO/RH(D): B POS
ANTIBODY SCREEN: POSITIVE
Donor AG Type: NEGATIVE
Donor AG Type: NEGATIVE
UNIT DIVISION: 0
UNIT DIVISION: 0
Unit division: 0

## 2018-11-19 LAB — BPAM RBC
Blood Product Expiration Date: 201912272359
Blood Product Expiration Date: 201912302359
Blood Product Expiration Date: 201912302359
ISSUE DATE / TIME: 201912110551
ISSUE DATE / TIME: 201912110852
UNIT TYPE AND RH: 7300
UNIT TYPE AND RH: 7300
Unit Type and Rh: 7300

## 2019-02-11 NOTE — Progress Notes (Signed)
Patient referred by Lindaann Pascal, PA-C for murmur  Subjective:   Jasmin Miller, female    DOB: November 01, 1941, 78 y.o.   MRN: [id number]   Chief Complaint  Patient presents with  . Heart Murmur     HPI  78 y.o. African American female with hypertension, prediabetes, referred for evaluation of murmur.  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 She reports exertional dyspnea with minimal activity. Symptoms have been stable for several months. Denies chest pain. Denies orthopnea, PND.   Past Medical History:  Diagnosis Date  . DM2 (diabetes mellitus, type 2) (HCC)   . Hypertension   . MI (myocardial infarction) (HCC)   . Symptomatic anemia    11/2018     Past Surgical History:  Procedure Laterality Date  . ABDOMINAL HYSTERECTOMY       Social History   Socioeconomic History  . Marital status: Widowed    Spouse name: Not on file  . Number of children: 4  . Years of education: Not on file  . Highest education level: Not on file  Occupational History  . Not on file  Social Needs  . Financial resource strain: Not on file  . Food insecurity:    Worry: Not on file    Inability: Not on file  . Transportation needs:    Medical: Not on file    Non-medical: Not on file  Tobacco Use  . Smoking status: Former Smoker    Packs/day: 1.00    Years: 29.00    Pack years: 29.00    Types: Cigarettes    Last attempt to quit: 1993    Years since quitting: 27.2  . Smokeless tobacco: Never Used  . Tobacco comment: SMOKED FOR 29 YEARS   Substance and Sexual Activity  . Alcohol use: No  . Drug use: No  . Sexual activity: Not on file  Lifestyle  . Physical activity:    Days per week: Not on file    Minutes per session: Not on file  . Stress: Not on file  Relationships  . Social connections:    Talks on phone: Not on file    Gets together: Not on file    Attends religious service: Not on file    Active member of club or organization: Not on file    Attends  meetings of clubs or organizations: Not on file    Relationship status: Not on file  . Intimate partner violence:    Fear of current or ex partner: Not on file    Emotionally abused: Not on file    Physically abused: Not on file    Forced sexual activity: Not on file  Other Topics Concern  . Not on file  Social History Narrative  . Not on file     Current Outpatient Medications on File Prior to Visit  Medication Sig Dispense Refill  . amLODipine (NORVASC) 5 MG tablet Take 5 mg by mouth daily.    Marland Kitchen aspirin EC 81 MG tablet Take 81 mg by mouth daily.    . cyanocobalamin (,VITAMIN B-12,) 1000 MCG/ML injection 6 month supply, 70ml SQ daily for 1 week then once SQ every 4 weeks. With 10 1cc BD syringes. 15 mL 0  . ferrous sulfate 325 (65 FE) MG tablet Take 1 tablet (325 mg total) by mouth 3 (three) times daily with meals. (Patient taking differently: Take 325 mg by mouth daily. ) 90 tablet 0  . lansoprazole (PREVACID)  30 MG capsule Take 30 mg by mouth daily.     . metFORMIN (GLUCOPHAGE-XR) 500 MG 24 hr tablet Take 500 mg by mouth daily with breakfast.    . naproxen sodium (ALEVE) 220 MG tablet Take 220 mg by mouth every 8 (eight) hours as needed (For pain.).    Marland Kitchen simvastatin (ZOCOR) 20 MG tablet Take 20 mg by mouth daily.    Marland Kitchen lidocaine (XYLOCAINE) 2 % solution Use as directed 5-10 mLs in the mouth or throat as needed for mouth pain. (Patient not taking: Reported on 02/14/2019) 100 mL 0  . lisinopril-hydrochlorothiazide (PRINZIDE,ZESTORETIC) 20-25 MG tablet Take 1 tablet by mouth daily.    . Naphazoline-Glycerin (CLEAR EYES MAX REDNESS RELIEF) 0.03-0.5 % SOLN Place 1 drop into both eyes 4 (four) times daily as needed (For eye redness or irritation.).    Marland Kitchen NIFEdipine (PROCARDIA-XL/ADALAT CC) 30 MG 24 hr tablet Take 1 tablet (30 mg total) by mouth every evening. (Patient not taking: Reported on 11/17/2018) 30 tablet 0  . triamcinolone cream (KENALOG) 0.1 % Apply 1 application topically daily as  needed (dry skin). Applies to elbows for dry skin.      No current facility-administered medications on file prior to visit.     Cardiovascular studies:  EKG 02/14/2019: Sinus rhythm 86 bpm. Possible old inferior infarct.   Recent labs:  CBC Latest Ref Rng & Units 11/18/2018  WBC 4.0 - 10.5 K/uL 3.7(L)  Hemoglobin 12.0 - 15.0 g/dL 0.2(X)  Hematocrit 11.5 - 46.0 % 25.5(L)  Platelets 150 - 400 K/uL 131(L)   CMP Latest Ref Rng & Units 11/18/2018  Glucose 70 - 99 mg/dL 93  BUN 8 - 23 mg/dL 14  Creatinine 5.20 - 8.02 mg/dL 2.33(K)  Sodium 122 - 449 mmol/L 140  Potassium 3.5 - 5.1 mmol/L 3.9  Chloride 98 - 111 mmol/L 106  CO2 22 - 32 mmol/L 26  Calcium 8.9 - 10.3 mg/dL 7.5(P)  Total Protein 6.5 - 8.1 g/dL -  Total Bilirubin 0.3 - 1.2 mg/dL -  Alkaline Phos 38 - 005 U/L -  AST 15 - 41 U/L -  ALT 0 - 44 U/L -      Review of Systems  Constitution: Negative for decreased appetite, malaise/fatigue, weight gain and weight loss.  HENT: Negative for congestion.   Eyes: Negative for visual disturbance.  Cardiovascular: Positive for dyspnea on exertion. Negative for chest pain, claudication, leg swelling, palpitations and syncope.  Respiratory: Positive for shortness of breath.   Endocrine: Negative for cold intolerance.  Hematologic/Lymphatic: Does not bruise/bleed easily.  Skin: Negative for itching and rash.  Musculoskeletal: Negative for myalgias.  Gastrointestinal: Negative for abdominal pain, nausea and vomiting.  Genitourinary: Negative for dysuria.  Neurological: Negative for dizziness and weakness.  Psychiatric/Behavioral: The patient is not nervous/anxious.   All other systems reviewed and are negative.        Vitals:   02/14/19 1507 02/14/19 1520  BP: (!) 162/78 (!) 157/76  Pulse: 90 88  SpO2: 98%     Objective:   Physical Exam  Constitutional: She is oriented to person, place, and time. She appears well-developed and well-nourished. No distress.  HENT:    Head: Normocephalic and atraumatic.  Eyes: Pupils are equal, round, and reactive to light. Conjunctivae are normal.  Neck: No JVD present.  Cardiovascular: Normal rate, regular rhythm and intact distal pulses.  Murmur (II/VI ejection systolic murmur RUSB) heard. Pulmonary/Chest: Effort normal and breath sounds normal. She has no wheezes. She has  no rales.  Abdominal: Soft. Bowel sounds are normal. There is no rebound.  Musculoskeletal:        General: No edema.  Lymphadenopathy:    She has no cervical adenopathy.  Neurological: She is alert and oriented to person, place, and time. No cranial nerve deficit.  Skin: Skin is warm and dry.  Psychiatric: She has a normal mood and affect.  Nursing note and vitals reviewed.         Assessment & Recommendations:   78 y.o. African American female with hypertension, prediabetes, referred for evaluation of murmur.  1. Murmur Suspect mild AS. Will obtain echocardiogram  2. Exertional dyspnea Most likely related to uncontrolled blood pressure.  If symptoms persist in spite of excellent blood pressure control, and no significant echocardiogram findings seen, will consider stress test in future.  3. Essential hypertension Suboptimal control.  Increase metoprolol succinate to 50 mg daily.  4. Screening cholesterol level Lipid panel ordered.  I will see her after above testing.   Thank you for referring the patient to [place]. Please feel free to contact with any questions.  Elder Negus, MD [facility] Pager: [phone] Office: [phone] If no answer Cell [phone]

## 2019-02-14 ENCOUNTER — Ambulatory Visit: Payer: Medicare HMO | Admitting: Cardiology

## 2019-02-14 ENCOUNTER — Encounter: Payer: Self-pay | Admitting: Cardiology

## 2019-02-14 VITALS — BP 157/76 | HR 88 | Ht 60.0 in | Wt 151.8 lb

## 2019-02-14 DIAGNOSIS — I10 Essential (primary) hypertension: Secondary | ICD-10-CM

## 2019-02-14 DIAGNOSIS — R0609 Other forms of dyspnea: Secondary | ICD-10-CM | POA: Diagnosis not present

## 2019-02-14 DIAGNOSIS — R011 Cardiac murmur, unspecified: Secondary | ICD-10-CM | POA: Insufficient documentation

## 2019-02-14 DIAGNOSIS — E785 Hyperlipidemia, unspecified: Secondary | ICD-10-CM | POA: Insufficient documentation

## 2019-02-14 DIAGNOSIS — Z13220 Encounter for screening for lipoid disorders: Secondary | ICD-10-CM | POA: Insufficient documentation

## 2019-02-14 MED ORDER — METOPROLOL SUCCINATE ER 50 MG PO TB24: 50.0000 mg | ORAL_TABLET | Freq: Every day | ORAL | 0 refills | Status: DC

## 2020-02-17 ENCOUNTER — Ambulatory Visit: Payer: Medicare HMO | Attending: Internal Medicine

## 2020-02-17 DIAGNOSIS — Z23 Encounter for immunization: Secondary | ICD-10-CM

## 2020-02-17 NOTE — Progress Notes (Signed)
   Covid-19 Vaccination Clinic  Name:  Jasmin Miller    MRN: [id number] DOB: Oct 27, 1941  02/17/2020  [name] was observed post Covid-19 immunization for 15 minutes without incident. She was provided with Vaccine Information Sheet and instruction to access the V-Safe system.   [name] was instructed to call 911 with any severe reactions post vaccine: Marland Kitchen Difficulty breathing  . Swelling of face and throat  . A fast heartbeat  . A bad rash all over body  . Dizziness and weakness   Immunizations Administered    Name Date Dose VIS Date Route   Pfizer COVID-19 Vaccine 02/17/2020  2:36 PM 0.3 mL 11/18/2019 Intramuscular   Manufacturer: [employer], [employer]   Lot: [id number]   NDC: [address]-9

## 2020-03-14 ENCOUNTER — Ambulatory Visit: Payer: Medicare HMO | Attending: Internal Medicine

## 2020-03-14 DIAGNOSIS — Z23 Encounter for immunization: Secondary | ICD-10-CM

## 2020-03-14 NOTE — Progress Notes (Signed)
   Covid-19 Vaccination Clinic  Name:  Chrystian Ressler    MRN: [id number] DOB: [birth year]  03/14/2020  Ms. Birnbaum was observed post Covid-19 immunization for 15 minutes without incident. She was provided with Vaccine Information Sheet and instruction to access the V-Safe system.   Ms. Pae was instructed to call 911 with any severe reactions post vaccine: Marland Kitchen Difficulty breathing  . Swelling of face and throat  . A fast heartbeat  . A bad rash all over body  . Dizziness and weakness   Immunizations Administered    Name Date Dose VIS Date Route   Pfizer COVID-19 Vaccine 03/14/2020  2:12 PM 0.3 mL 11/18/2019 Intramuscular   Manufacturer: [employer], [employer]   Lot: [id number]   NDC: [address]-7

## 2020-04-02 IMAGING — DX DG CHEST 2V
2 series · 2 of 2 positions shown · non-contrast
Comparison: None.

CLINICAL DATA: Dyspnea for the past 2 days.

EXAM:
CHEST - 2 VIEW

[chest lat]
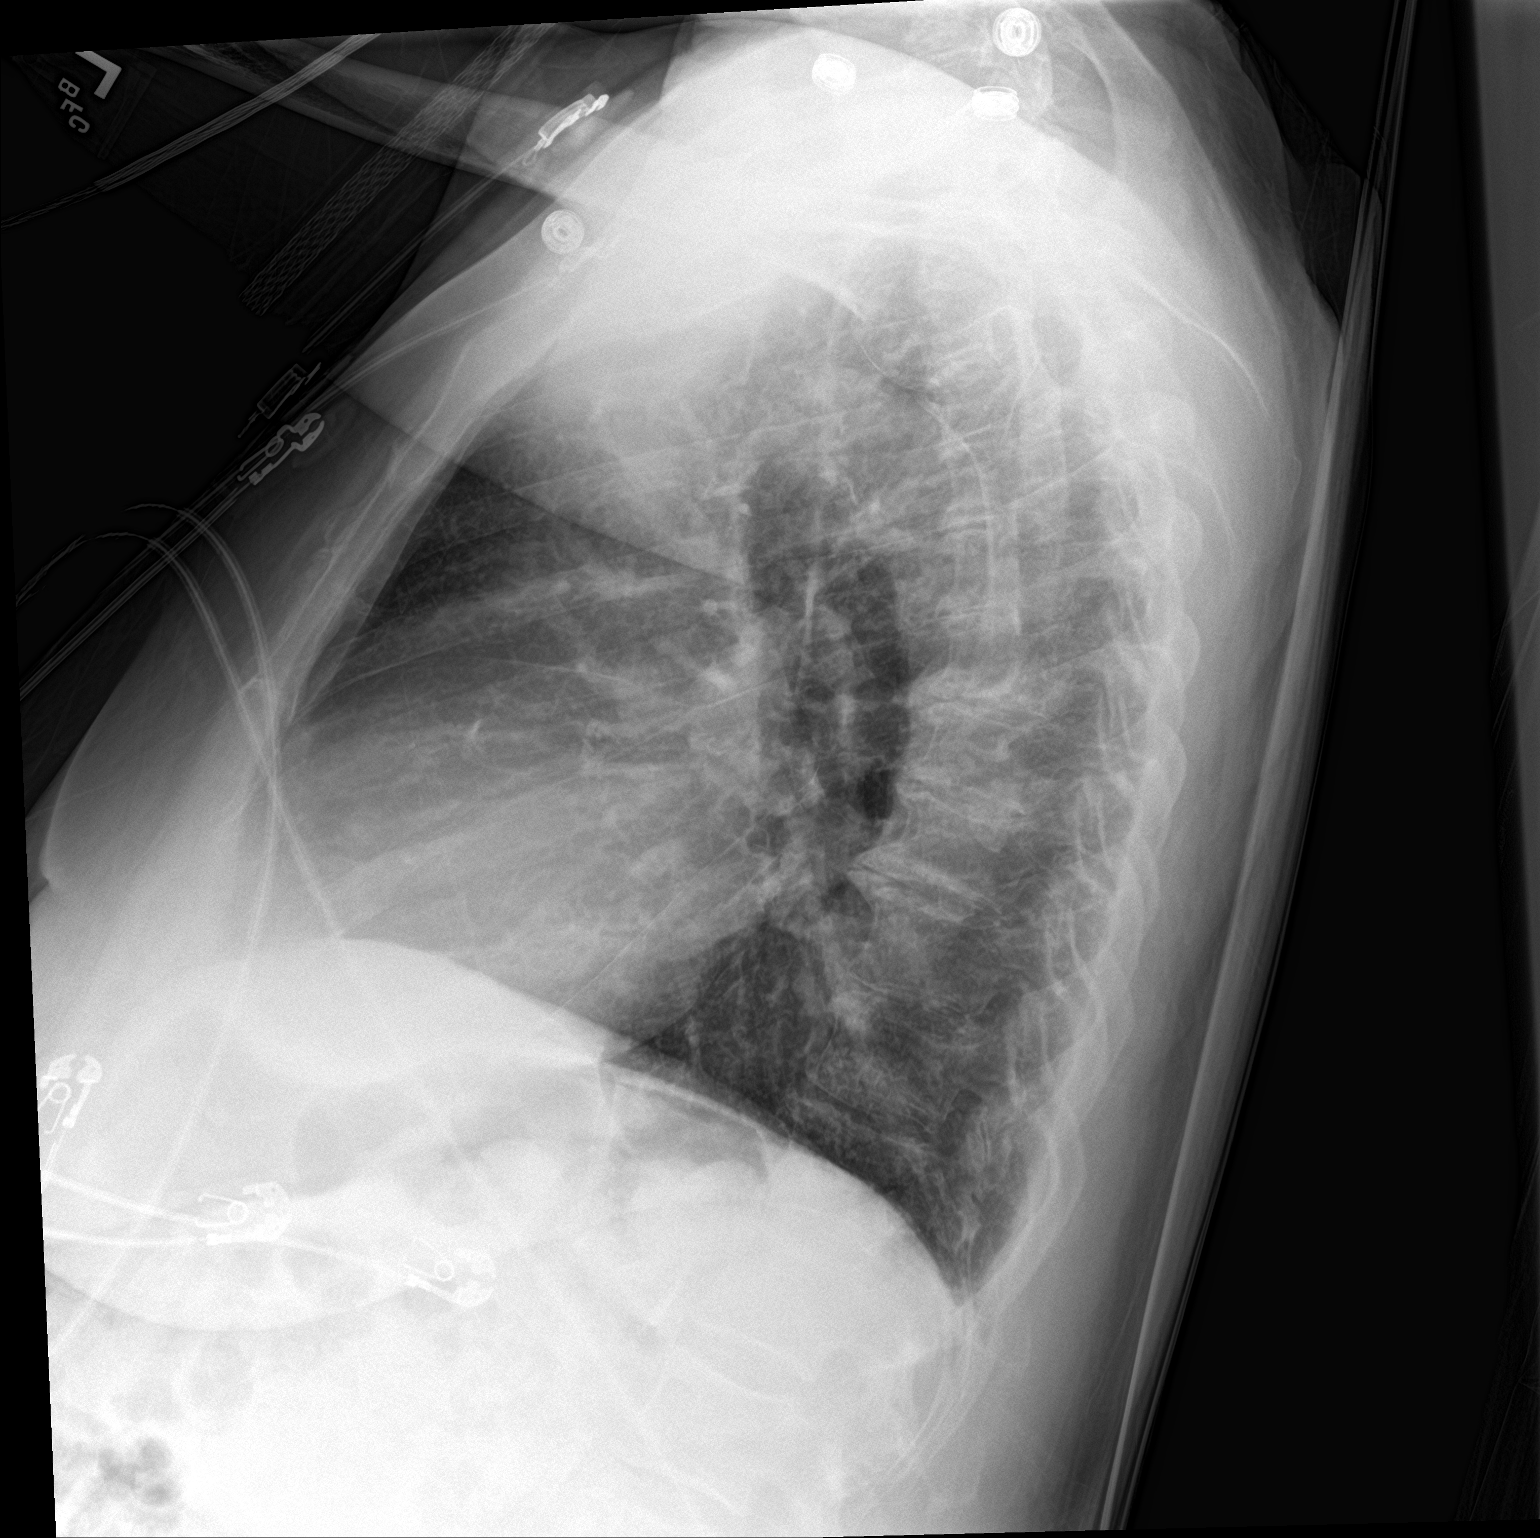

[chest ap]
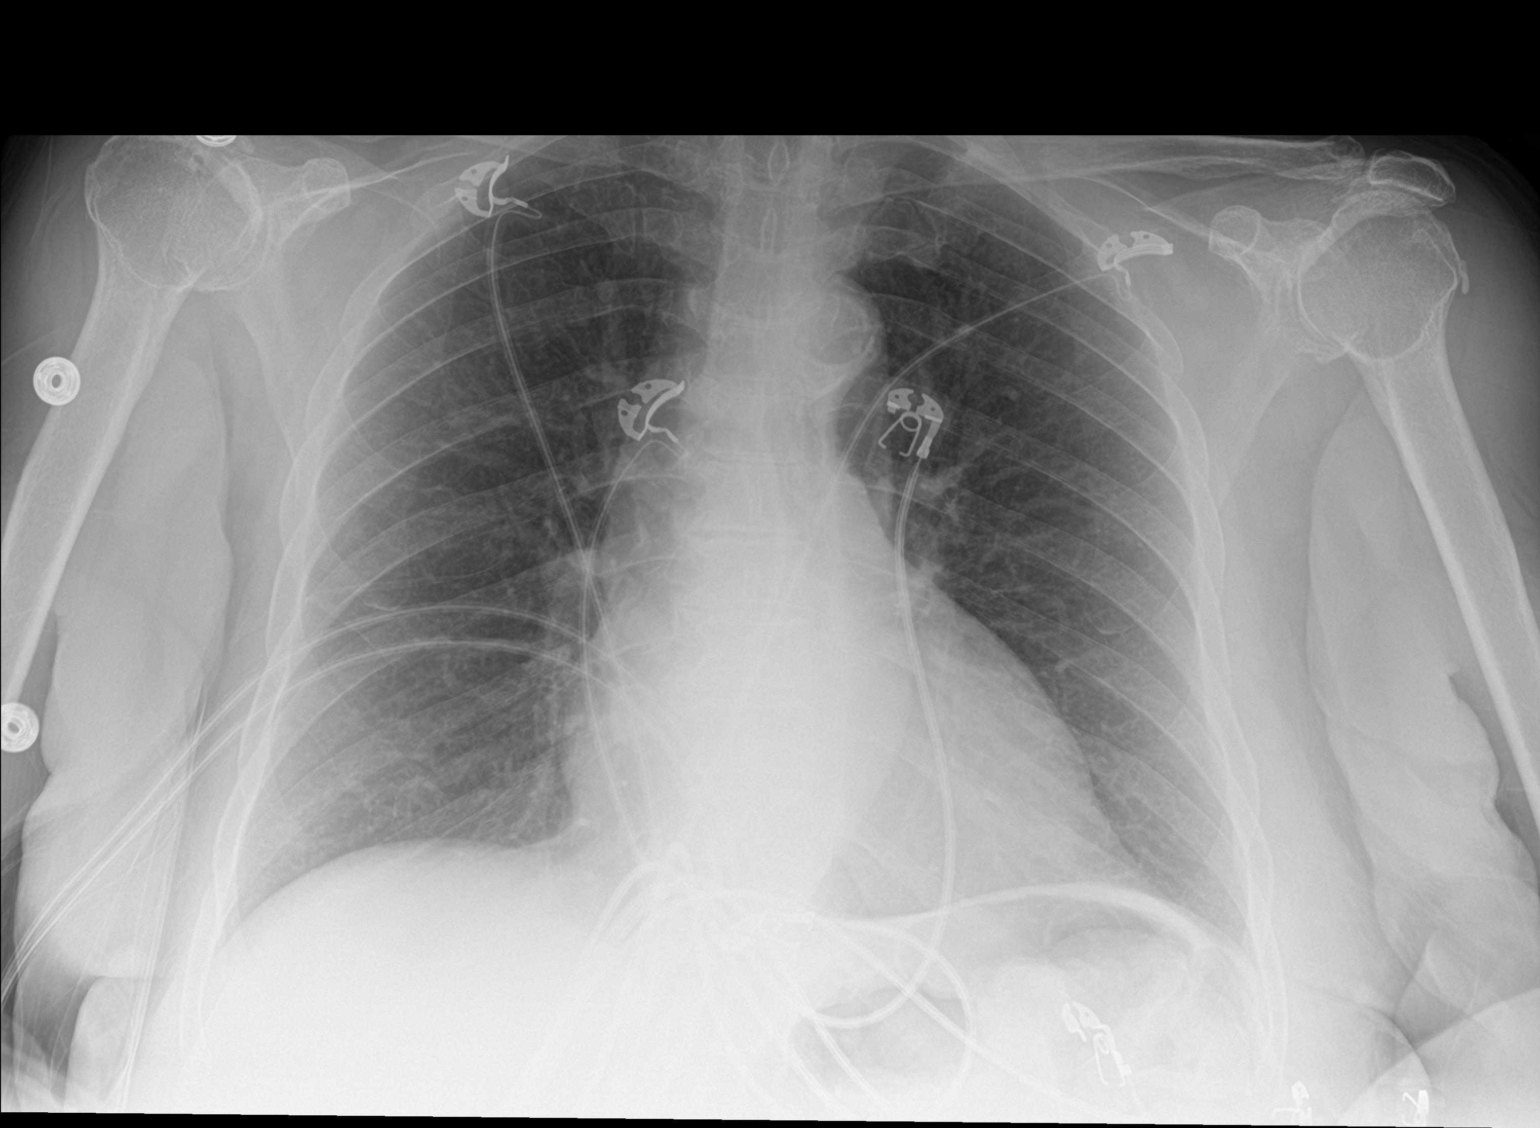

[2 of 2 positions shown; findings below may reference images not displayed]

FINDINGS: Normal sized heart. Clear lungs with normal vascularity. Tortuous
and partially calcified thoracic aorta. Diffuse osteopenia.
Bilateral shoulder degenerative changes and superior migration of
the humeral heads. Thoracic spine degenerative changes.
IMPRESSION: 1. No acute abnormality.
2. Bilateral shoulder degenerative changes and evidence of chronic
bilateral rotator cuff tears.

## 2020-07-10 ENCOUNTER — Encounter (HOSPITAL_COMMUNITY): Payer: Self-pay | Admitting: Emergency Medicine

## 2020-07-10 ENCOUNTER — Other Ambulatory Visit: Payer: Self-pay

## 2020-07-10 ENCOUNTER — Inpatient Hospital Stay (HOSPITAL_COMMUNITY)
Admission: EM | Admit: 2020-07-10 | Discharge: 2020-07-12 | DRG: 394 | Disposition: A | Discharge status: Home / Self Care | Payer: Medicare HMO | Attending: Family Medicine | Admitting: Family Medicine

## 2020-07-10 ENCOUNTER — Emergency Department (HOSPITAL_COMMUNITY): Payer: Medicare HMO

## 2020-07-10 DIAGNOSIS — E1159 Type 2 diabetes mellitus with other circulatory complications: Secondary | ICD-10-CM | POA: Diagnosis present

## 2020-07-10 DIAGNOSIS — D62 Acute posthemorrhagic anemia: Secondary | ICD-10-CM | POA: Diagnosis present

## 2020-07-10 DIAGNOSIS — K2900 Acute gastritis without bleeding: Secondary | ICD-10-CM | POA: Diagnosis present

## 2020-07-10 DIAGNOSIS — I129 Hypertensive chronic kidney disease with stage 1 through stage 4 chronic kidney disease, or unspecified chronic kidney disease: Secondary | ICD-10-CM | POA: Diagnosis present

## 2020-07-10 DIAGNOSIS — Z7984 Long term (current) use of oral hypoglycemic drugs: Secondary | ICD-10-CM

## 2020-07-10 DIAGNOSIS — E876 Hypokalemia: Secondary | ICD-10-CM | POA: Diagnosis present

## 2020-07-10 DIAGNOSIS — D519 Vitamin B12 deficiency anemia, unspecified: Secondary | ICD-10-CM | POA: Diagnosis present

## 2020-07-10 DIAGNOSIS — Z7982 Long term (current) use of aspirin: Secondary | ICD-10-CM

## 2020-07-10 DIAGNOSIS — K5520 Angiodysplasia of colon without hemorrhage: Secondary | ICD-10-CM | POA: Diagnosis not present

## 2020-07-10 DIAGNOSIS — W57XXXA Bitten or stung by nonvenomous insect and other nonvenomous arthropods, initial encounter: Secondary | ICD-10-CM

## 2020-07-10 DIAGNOSIS — K31819 Angiodysplasia of stomach and duodenum without bleeding: Secondary | ICD-10-CM | POA: Diagnosis present

## 2020-07-10 DIAGNOSIS — R634 Abnormal weight loss: Secondary | ICD-10-CM | POA: Diagnosis not present

## 2020-07-10 DIAGNOSIS — I10 Essential (primary) hypertension: Secondary | ICD-10-CM

## 2020-07-10 DIAGNOSIS — Z20822 Contact with and (suspected) exposure to covid-19: Secondary | ICD-10-CM | POA: Diagnosis present

## 2020-07-10 DIAGNOSIS — E1122 Type 2 diabetes mellitus with diabetic chronic kidney disease: Secondary | ICD-10-CM | POA: Diagnosis present

## 2020-07-10 DIAGNOSIS — I252 Old myocardial infarction: Secondary | ICD-10-CM

## 2020-07-10 DIAGNOSIS — Z79899 Other long term (current) drug therapy: Secondary | ICD-10-CM

## 2020-07-10 DIAGNOSIS — N179 Acute kidney failure, unspecified: Secondary | ICD-10-CM | POA: Diagnosis present

## 2020-07-10 DIAGNOSIS — I152 Hypertension secondary to endocrine disorders: Secondary | ICD-10-CM | POA: Diagnosis present

## 2020-07-10 DIAGNOSIS — D649 Anemia, unspecified: Secondary | ICD-10-CM | POA: Diagnosis not present

## 2020-07-10 DIAGNOSIS — K644 Residual hemorrhoidal skin tags: Secondary | ICD-10-CM | POA: Diagnosis present

## 2020-07-10 DIAGNOSIS — E119 Type 2 diabetes mellitus without complications: Secondary | ICD-10-CM

## 2020-07-10 DIAGNOSIS — E785 Hyperlipidemia, unspecified: Secondary | ICD-10-CM | POA: Diagnosis present

## 2020-07-10 DIAGNOSIS — K5730 Diverticulosis of large intestine without perforation or abscess without bleeding: Secondary | ICD-10-CM | POA: Diagnosis present

## 2020-07-10 DIAGNOSIS — Z87891 Personal history of nicotine dependence: Secondary | ICD-10-CM

## 2020-07-10 DIAGNOSIS — D509 Iron deficiency anemia, unspecified: Secondary | ICD-10-CM | POA: Diagnosis present

## 2020-07-10 DIAGNOSIS — Z207 Contact with and (suspected) exposure to pediculosis, acariasis and other infestations: Secondary | ICD-10-CM | POA: Diagnosis present

## 2020-07-10 DIAGNOSIS — N1831 Chronic kidney disease, stage 3a: Secondary | ICD-10-CM | POA: Diagnosis present

## 2020-07-10 DIAGNOSIS — Z90710 Acquired absence of both cervix and uterus: Secondary | ICD-10-CM

## 2020-07-10 DIAGNOSIS — K641 Second degree hemorrhoids: Secondary | ICD-10-CM | POA: Diagnosis present

## 2020-07-10 LAB — COMPREHENSIVE METABOLIC PANEL
ALT: 22 U/L (ref 0–44)
AST: 22 U/L (ref 15–41)
Albumin: 3.4 g/dL — ABNORMAL LOW (ref 3.5–5.0)
Alkaline Phosphatase: 72 U/L (ref 38–126)
Anion gap: 10 (ref 5–15)
BUN: 21 mg/dL (ref 8–23)
CO2: 24 mmol/L (ref 22–32)
Calcium: 8.9 mg/dL (ref 8.9–10.3)
Chloride: 108 mmol/L (ref 98–111)
Creatinine, Ser: 1.27 mg/dL — ABNORMAL HIGH (ref 0.44–1.00)
GFR calc Af Amer: 46 mL/min — ABNORMAL LOW (ref >60)
GFR calc non Af Amer: 40 mL/min — ABNORMAL LOW (ref >60)
Glucose, Bld: 93 mg/dL (ref 70–99)
Potassium: 3.4 mmol/L — ABNORMAL LOW (ref 3.5–5.1)
Sodium: 142 mmol/L (ref 135–145)
Total Bilirubin: 0.4 mg/dL (ref 0.3–1.2)
Total Protein: 6.7 g/dL (ref 6.5–8.1)

## 2020-07-10 LAB — POC OCCULT BLOOD, ED: Fecal Occult Bld: NEGATIVE

## 2020-07-10 LAB — CBC WITH DIFFERENTIAL/PLATELET
Abs Immature Granulocytes: 0.03 10*3/uL (ref 0.00–0.07)
Basophils Absolute: 0 10*3/uL (ref 0.0–0.1)
Basophils Relative: 1 %
Eosinophils Absolute: 0.3 10*3/uL (ref 0.0–0.5)
Eosinophils Relative: 4 %
HCT: 16.5 % — ABNORMAL LOW (ref 36.0–46.0)
Hemoglobin: 4.6 g/dL — CL (ref 12.0–15.0)
Immature Granulocytes: 1 %
Lymphocytes Relative: 16 %
Lymphs Abs: 1.1 10*3/uL (ref 0.7–4.0)
MCH: 21 pg — ABNORMAL LOW (ref 26.0–34.0)
MCHC: 27.9 g/dL — ABNORMAL LOW (ref 30.0–36.0)
MCV: 75.3 fL — ABNORMAL LOW (ref 80.0–100.0)
Monocytes Absolute: 0.8 10*3/uL (ref 0.1–1.0)
Monocytes Relative: 12 %
Neutro Abs: 4.4 10*3/uL (ref 1.7–7.7)
Neutrophils Relative %: 66 %
Platelets: 300 10*3/uL (ref 150–400)
RBC: 2.19 MIL/uL — ABNORMAL LOW (ref 3.87–5.11)
RDW: 18.9 % — ABNORMAL HIGH (ref 11.5–15.5)
WBC: 6.7 10*3/uL (ref 4.0–10.5)
nRBC: 0.6 % — ABNORMAL HIGH (ref 0.0–0.2)

## 2020-07-10 LAB — PREPARE RBC (CROSSMATCH)

## 2020-07-10 LAB — SARS CORONAVIRUS 2 BY RT PCR (HOSPITAL ORDER, PERFORMED IN ~~LOC~~ HOSPITAL LAB): SARS Coronavirus 2: NEGATIVE

## 2020-07-10 MED ORDER — SODIUM CHLORIDE 0.9% IV SOLUTION: Freq: Once | INTRAVENOUS | Status: AC

## 2020-07-10 NOTE — ED Notes (Signed)
Pt now is stating she is about take her BP medication at this time. This RN states we would prefer she not take her home meds at this time and that we would give her medications here. Pt begins to get upset begins to fuss about it already been verified and that she takes it every night. This RN again tries to explain to pt why we advise against taking home medication but pt stops this RN mid sentence and begins to fuss again then ends with "dont come back in here" anymore. MD notified.

## 2020-07-10 NOTE — H&P (Addendum)
[facility] History and Physical  Zeniah Briney GHW:[id number] DOB: October 15, 1941 DOA: 07/10/2020  Referring physician: Dr. Anitra Lauth PCP: Lindaann Pascal, PA-C   Chief Complaint: low hemoglobin, fatigue  HPI: Jasmin Miller is a 79 y.o. female with history of B12 anemia, HTN, DM2, who presents at direction of her PCP due to a low hgb found on recent labs.   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 Reports appetite has been poor over that time  Reports has been with a client who has bedbugs and has found multiple bites on herself Threw clothes in dryer at home Has not found any at home Exterminator has come to her home Has been one week since she discovered this  In the ED VS unremarkable, labs notable for hgb 4.6 with MCV 75 and normal WBC/platelets, CMP with Cr 1.27, neg fecal occult, normal CXR, unremarkable ECG. She was ordered for 2u pRBC and admitted for symptomatic anemia.    Review of Systems:  Pertinent positives and negative per HPI, all others reviewed and negative   Past Medical History:  Diagnosis Date  . DM2 (diabetes mellitus, type 2) (HCC)   . Hypertension   . MI (myocardial  infarction) (HCC)   . Symptomatic anemia    11/2018   Past Surgical History:  Procedure Laterality Date  . ABDOMINAL HYSTERECTOMY     Social History:  reports that she quit smoking about 28 years ago. Her smoking use included cigarettes. She has a 29.00 pack-year smoking history. She has never used smokeless tobacco. She reports that she does not drink alcohol and does not use drugs.  No Known Allergies  Family History  Problem Relation Age of Onset  . Anemia Mother   . Stroke Mother   . Anemia Daughter      Prior to Admission medications   Medication Sig Start Date End Date Taking? Authorizing Provider  amLODipine (NORVASC) 5 MG tablet Take 5 mg by mouth daily.   Yes [provider]  aspirin EC 81 MG tablet Take 81 mg by mouth daily.   Yes [provider]  Cholecalciferol (VITAMIN D3 PO) Take 1 capsule by mouth daily.   Yes [provider]  lisinopril-hydrochlorothiazide (PRINZIDE,ZESTORETIC) 20-25 MG tablet Take 1 tablet by mouth daily.   Yes [provider]  metFORMIN (GLUCOPHAGE-XR) 500 MG 24 hr tablet Take 500 mg by mouth daily with breakfast.   Yes [provider]  metoprolol succinate (TOPROL-XL) 25 MG 24 hr tablet Take 25 mg by mouth daily. 07/03/20  Yes [provider]  rosuvastatin (CRESTOR) 10 MG tablet Take 10 mg by mouth daily. 07/01/20  Yes [provider]  triamcinolone cream (KENALOG) 0.1 % Apply 1 application topically every 8 (eight) hours as needed (itching).    Yes [provider]  vitamin B-12 (CYANOCOBALAMIN) 1000 MCG tablet Take 1,000 mcg by mouth once a week. On Mondays   Yes [provider]  cyanocobalamin (,VITAMIN B-12,) 1000 MCG/ML injection 6 month supply, 36ml SQ daily for 1 week then once SQ every 4 weeks. With 10 1cc BD syringes. Patient not taking: Reported on 07/10/2020 11/18/18   Leroy Sea, MD  ferrous sulfate 325 (65 FE) MG tablet Take 1 tablet (325 mg total) by  mouth 3 (three) times daily with meals. Patient not taking: Reported on 07/10/2020 11/18/18   Leroy Sea, MD  lidocaine (XYLOCAINE) 2 % solution Use as directed 5-10 mLs in the mouth or throat as needed for mouth pain. Patient not taking: Reported on 02/14/2019 01/24/18   Ofilia Neas, PA-C  metoprolol succinate (TOPROL-XL) 50 MG 24 hr tablet Take 1 tablet (50 mg total) by mouth daily. Take with or immediately following a meal. Patient not taking: Reported on 07/10/2020 02/14/19   Elder Negus, MD  NIFEdipine (PROCARDIA-XL/ADALAT CC) 30 MG 24 hr tablet Take 1 tablet (30 mg total) by mouth every evening. Patient not taking: Reported on 11/17/2018 10/03/15   Roxy Horseman, PA-C   Physical Exam: Vitals:   07/10/20 1930 07/10/20 2117 07/10/20 2120 07/10/20 2125  BP: 139/61 (!) 135/51 (!) 148/68 (!) 141/59  Pulse:  94    Resp:  20  20  Temp:  98.5 F (36.9 C)    TempSrc:  Oral    SpO2:      Weight:      Height:        Wt Readings from Last 3 Encounters:  07/10/20 73.5 kg  02/14/19 68.9 kg  10/03/15 78.5 kg    General:  Appears calm and comfortable Eyes: PERRL, normal lids, irises & conjunctiva ENT: grossly normal hearing, lips & tongue Neck: no LAD, masses or thyromegaly Cardiovascular: RRR, no m/r/g. No LE edema. Respiratory: CTA bilaterally, no w/r/r. Normal respiratory effort. Abdomen: soft, ntnd Skin: multiple excoriated lesions on back and arms Musculoskeletal: grossly normal tone BUE/BLE Psychiatric: grossly normal mood and affect, speech fluent and appropriate Neurologic: grossly non-focal.          Labs on Admission:  Basic Metabolic Panel: Recent Labs  Lab 07/10/20 1613  NA 142  K 3.4*  CL 108  CO2 24  GLUCOSE 93  BUN 21  CREATININE 1.27*  CALCIUM 8.9   Liver Function Tests: Recent Labs  Lab 07/10/20 1613  AST 22  ALT 22  ALKPHOS 72  BILITOT 0.4  PROT 6.7  ALBUMIN 3.4*   No results for input(s): LIPASE, AMYLASE in the last 168  hours. No results for input(s): AMMONIA in the last 168 hours. CBC: Recent Labs  Lab 07/10/20 1613  WBC 6.7  NEUTROABS 4.4  HGB 4.6*  HCT 16.5*  MCV 75.3*  PLT 300   Cardiac Enzymes: No results for input(s): CKTOTAL, CKMB, CKMBINDEX, TROPONINI in the last 168 hours.  BNP (last 3 results) No results for input(s): BNP in the last 8760 hours.  ProBNP (last 3 results) No results for input(s): PROBNP in the last 8760 hours.  CBG: No results for input(s): GLUCAP in the last 168 hours.  Radiological Exams on Admission: DG Chest Port 1 View  Result Date: 07/10/2020 CLINICAL DATA:  Shortness of breath, low hemoglobin EXAM: PORTABLE CHEST 1 VIEW  COMPARISON:  Radiograph 11/17/2018 FINDINGS: No consolidation, features of edema, pneumothorax, or effusion. The aorta is calcified. The remaining cardiomediastinal contours are unremarkable. No acute osseous or soft tissue abnormality. Degenerative changes are present in the imaged spine and shoulders. IMPRESSION: No acute cardiopulmonary abnormality. Aortic Atherosclerosis (ICD10-I70.0). Electronically Signed   By: Kreg Shropshire M.D.   On: 07/10/2020 18:36    EKG: Independently reviewed. NSR, unremarkable w/o ischemic changes  Assessment/Plan Active Problems:   Symptomatic anemia   Unintentional weight loss   HTN (hypertension)   DM2 (diabetes mellitus, type 2) (HCC)   Hyperlipidemia   Microcytic anemia   AKI (acute kidney injury) (HCC)   #Symptomatic Anemia #Microcytic anemia Patient presenting with symptomatic anemia, last hgb check from almost two years prior was 8.5 and macrocytic at that time. Stressed life threatening nature of this severe anemia with patient. Currently she is microcytic with MCV 75, other cell lines normal. No history of significant blood losses. Will perform basic lab workup to evaluate etiology, also discussed with patient importance of getting colonoscopy. - iron, TIBC, ferritin - B12 - reticulocyte count -  peripheral smear - consider outpatient heme eval pending workup - referral for outpatient GI eval for diagnostic colonoscopy  #Unintentional weight loss Reports ~20 lbs weight loss over past year, reported the same at last admission almost two years prior. - outpatient work up - outpatient routine cancer screening  #AKI Mildly increased Cr from baseline of ~1 to 1.2, suspect pre-renal vs secondary to anemia, trend BMP.   #Exposure to bedbugs Reports recent exposure to bedbugs with lesions on back and arms, no bedbugs seen but will put on contact precautions.   #Known Medical Problems HTN: cont amlodipine 5, metop 25 daily, lisinopril-hctz 20-25 daily HLD: cont rosuvastatin 10 daily DM2: cont metformin 500 mg daily  Code Status: Full Code, though in discussion reports she would not want prolonged ventilation DVT Prophylaxis: SCDs Family Communication: Daughter Jonelle Sidle present during interview and exam Disposition Plan: Obs, likely discharge in AM  Time spent: 50 min  Venora Maples MD/MPH [facility]

## 2020-07-10 NOTE — ED Provider Notes (Signed)
[facility] EMERGENCY DEPARTMENT Provider Note   CSN: [id number] Arrival date & time: 07/10/20  1535     History Chief Complaint  Patient presents with  . Abnormal Lab    Jasmin Miller is a 79 y.o. female.  Patient is a 80 year old female with a history of anemia from B12 deficiency, MI, hypertension and diabetes who is presenting today with complaints of shortness of breath, palpitations and fatigue worsened by exertion.  She saw her doctor last week and had blood drawn and was called today reporting that her hemoglobin was significantly low and she needed to come to the hospital.  Patient states symptoms have just continued and are not improving.  She denies any black stools.  She does take a baby aspirin a day but no other anticoagulation.  She does have occasional nosebleeds but denies massive bleeding.  She is taking oral B12 but not receiving injections anymore.  She denies any chest pain, fever, cough, abdominal pain.  She has been eating and drinking normally.  The history is provided by the patient.  Abnormal Lab Time since result:  Today Patient referred by:  PCP Resulting agency:  External Resulting agency details:  [facility] Result type: hematology   Hematology:    Hemoglobin:  Low      Past Medical History:  Diagnosis Date  . DM2 (diabetes mellitus, type 2) (HCC)   . Hypertension   . MI (myocardial infarction) (HCC)   . Symptomatic anemia    11/2018    Patient Active Problem List   Diagnosis Date Noted  . Hyperlipidemia 02/14/2019  . Murmur 02/14/2019  . Exertional dyspnea 02/14/2019  . Screening cholesterol level 02/14/2019  . Macrocytic anemia 11/17/2018  . Symptomatic anemia 11/17/2018  . Unintentional weight loss 11/17/2018  . HTN (hypertension) 11/17/2018  . DM2 (diabetes mellitus, type 2) (HCC) 11/17/2018    Past Surgical History:  Procedure Laterality Date  . ABDOMINAL HYSTERECTOMY       OB History   No  obstetric history on file.     Family History  Problem Relation Age of Onset  . Anemia Mother   . Stroke Mother   . Anemia Daughter     Social History   Tobacco Use  . Smoking status: Former Smoker    Packs/day: 1.00    Years: 29.00    Pack years: 29.00    Types: Cigarettes    Quit date: 1993    Years since quitting: 28.6  . Smokeless tobacco: Never Used  . Tobacco comment: SMOKED FOR 29 YEARS   Vaping Use  . Vaping Use: Never used  Substance Use Topics  . Alcohol use: No  . Drug use: No    Home Medications Prior to Admission medications   Medication Sig Start Date End Date Taking? Authorizing Provider  amLODipine (NORVASC) 5 MG tablet Take 5 mg by mouth daily.    [provider]  aspirin EC 81 MG tablet Take 81 mg by mouth daily.    [provider]  cyanocobalamin (,VITAMIN B-12,) 1000 MCG/ML injection 6 month supply, 2ml SQ daily for 1 week then once SQ every 4 weeks. With 10 1cc BD syringes. 11/18/18   Leroy Sea, MD  ferrous sulfate 325 (65 FE) MG tablet Take 1 tablet (325 mg total) by mouth 3 (three) times daily with meals. Patient taking differently: Take 325 mg by mouth daily.  11/18/18   Leroy Sea, MD  lansoprazole (PREVACID) 30 MG  capsule Take 30 mg by mouth daily.     [provider]  lidocaine (XYLOCAINE) 2 % solution Use as directed 5-10 mLs in the mouth or throat as needed for mouth pain. Patient not taking: Reported on 02/14/2019 01/24/18   Ofilia Neas, PA-C  lisinopril-hydrochlorothiazide (PRINZIDE,ZESTORETIC) 20-25 MG tablet Take 1 tablet by mouth daily.    [provider]  metFORMIN (GLUCOPHAGE-XR) 500 MG 24 hr tablet Take 500 mg by mouth daily with breakfast.    [provider]  metoprolol succinate (TOPROL-XL) 50 MG 24 hr tablet Take 1 tablet (50 mg total) by mouth daily. Take with or immediately following a meal. 02/14/19   Patwardhan, Manish J, MD  Naphazoline-Glycerin (CLEAR EYES MAX REDNESS  RELIEF) 0.03-0.5 % SOLN Place 1 drop into both eyes 4 (four) times daily as needed (For eye redness or irritation.).    [provider]  naproxen sodium (ALEVE) 220 MG tablet Take 220 mg by mouth every 8 (eight) hours as needed (For pain.).    [provider]  NIFEdipine (PROCARDIA-XL/ADALAT CC) 30 MG 24 hr tablet Take 1 tablet (30 mg total) by mouth every evening. Patient not taking: Reported on 11/17/2018 10/03/15   Roxy Horseman, PA-C  simvastatin (ZOCOR) 20 MG tablet Take 20 mg by mouth daily.    [provider]  triamcinolone cream (KENALOG) 0.1 % Apply 1 application topically daily as needed (dry skin). Applies to elbows for dry skin.     [provider]    Allergies    Patient has no known allergies.  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  She is continued to suffer with issues with the bedbug bites and reports they burn and itch.  All other systems reviewed and are negative.   Physical Exam Updated Vital Signs BP 140/63 (BP Location: Left Arm)   Pulse 93   Temp 98.2 F (36.8 C) (Oral)   Resp 18   Ht 5\' 1"  (1.549 m)   Wt 73.5 kg   SpO2 98%   BMI 30.61 kg/m   Physical Exam Vitals and nursing note reviewed.  Constitutional:      General: She is not in acute distress.    Appearance: Normal appearance. She is well-developed and normal weight.  HENT:     Head: Normocephalic and atraumatic.  Eyes:     Pupils: Pupils are equal, round, and reactive to light.     Comments: Pale conjunctive a  Cardiovascular:     Rate and Rhythm: Regular rhythm. Tachycardia present.     Pulses: Normal pulses.     Heart sounds: Normal heart sounds. No murmur heard.  No friction rub.  Pulmonary:     Effort: Pulmonary effort is normal.     Breath sounds: Normal breath sounds. No wheezing or rales.  Abdominal:      General: Bowel sounds are normal. There is no distension.     Palpations: Abdomen is soft.     Tenderness: There is no abdominal tenderness. There is no guarding or rebound.  Genitourinary:    Rectum: Guaiac result negative.  Musculoskeletal:        General: No tenderness. Normal range of motion.     Right lower leg: Edema present.     Left lower leg: Edema present.     Comments:  Trace edema in ankles bilaterally  Skin:    General: Skin is warm and dry.     Coloration: Skin is pale.     Findings: No rash.  Neurological:     General: No focal deficit present.     Mental Status: She is alert and oriented to person, place, and time. Mental status is at baseline.     Cranial Nerves: No cranial nerve deficit.  Psychiatric:        Mood and Affect: Mood normal.        Behavior: Behavior normal.        Thought Content: Thought content normal.      ED Results / Procedures / Treatments   Labs (all labs ordered are listed, but only abnormal results are displayed) Labs Reviewed  CBC WITH DIFFERENTIAL/PLATELET - Abnormal; Notable for the following components:      Result Value   RBC 2.19 (*)    Hemoglobin 4.6 (*)    HCT 16.5 (*)    MCV 75.3 (*)    MCH 21.0 (*)    MCHC 27.9 (*)    RDW 18.9 (*)    nRBC 0.6 (*)    All other components within normal limits  COMPREHENSIVE METABOLIC PANEL - Abnormal; Notable for the following components:   Potassium 3.4 (*)    Creatinine, Ser 1.27 (*)    Albumin 3.4 (*)    GFR calc non Af Amer 40 (*)    GFR calc Af Amer 46 (*)    All other components within normal limits  SARS CORONAVIRUS 2 BY RT PCR (HOSPITAL ORDER, PERFORMED IN [facility])  POC OCCULT BLOOD, ED  TYPE AND SCREEN  PREPARE RBC (CROSSMATCH)    EKG EKG Interpretation  Date/Time:  Tuesday July 10 2020 15:55:36 EDT Ventricular Rate:  97 PR Interval:  118 QRS Duration: 82 QT Interval:  340 QTC Calculation: 431 R Axis:   26 Text Interpretation: Normal sinus  rhythm Nonspecific ST and T wave abnormality Abnormal ECG When compared with ECG of 11/16/2018, Nonspecific ST abnormality is now present Confirmed by Dione BoozeGlick, David ([id number]) on 07/10/2020 3:59:42 PM   Radiology DG Chest Port 1 View  Result Date: 07/10/2020 CLINICAL DATA:  Shortness of breath, low hemoglobin EXAM: PORTABLE CHEST 1 VIEW COMPARISON:  Radiograph 11/17/2018 FINDINGS: No consolidation, features of edema, pneumothorax, or effusion. The aorta is calcified. The remaining cardiomediastinal contours are unremarkable. No acute osseous or soft tissue abnormality. Degenerative changes are present in the imaged spine and shoulders. IMPRESSION: No acute cardiopulmonary abnormality. Aortic Atherosclerosis (ICD10-I70.0). Electronically Signed   By: Kreg ShropshirePrice  DeHay M.D.   On: 07/10/2020 18:36    Procedures Procedures (including critical care time)  Medications Ordered in ED Medications  0.9 %  sodium chloride infusion (Manually program via Guardrails IV Fluids) (has no administration in time range)    ED Course  I have reviewed the triage vital signs and the nursing notes.  Pertinent labs & imaging results that were available during my care of the patient were reviewed by me and considered in my medical decision making (see chart for details).    MDM Rules/Calculators/A&P                          Elderly female presenting today with recurrent anemia.  Patient with a hemoglobin of 4.6 today where prior results showed that her average was eight.  Patient did have a history of B12 deficiency relating  to macrocytic anemia however today patient's anemia is microcytic.  She is Hemoccult negative and takes no anticoagulation.  No history of significant blood loss otherwise.  Patient becomes extremely winded and tachycardic even with sitting up in the bed and scooting herself back.  She has had increasing fatigue and shortness of breath.  She denies any chest pain at this time.  CMP with mild increase in  creatinine of 1.27 from prior studies but they were several years ago.  On EKG today patient has more pronounced ST depression than in prior EKG.  Chest x-ray pending.  Patient typed and screened.  Will transfuse and admit for further care.  CRITICAL CARE Performed by: Ole Lafon Total critical care time: 30 minutes Critical care time was exclusive of separately billable procedures and treating other patients. Critical care was necessary to treat or prevent imminent or life-threatening deterioration. Critical care was time spent personally by me on the following activities: development of treatment plan with patient and/or surrogate as well as nursing, discussions with consultants, evaluation of patient's response to treatment, examination of patient, obtaining history from patient or surrogate, ordering and performing treatments and interventions, ordering and review of laboratory studies, ordering and review of radiographic studies, pulse oximetry and re-evaluation of patient's condition.  MDM Number of Diagnoses or Management Options   Amount and/or Complexity of Data Reviewed Clinical lab tests: reviewed and ordered Tests in the radiology section of CPT: reviewed and ordered Tests in the medicine section of CPT: ordered and reviewed Decide to obtain previous medical records or to obtain history from someone other than the patient: yes Obtain history from someone other than the patient: no Review and summarize past medical records: yes Discuss the patient with other providers: yes Independent visualization of images, tracings, or specimens: yes  Risk of Complications, Morbidity, and/or Mortality Presenting problems: high Diagnostic procedures: low Management options: moderate  Patient Progress Patient progress: stable   Final Clinical Impression(s) / ED Diagnoses Final diagnoses:  Symptomatic anemia    Rx / DC Orders ED Discharge Orders    None       Gwyneth Sprout, MD 07/11/20 2204

## 2020-07-10 NOTE — ED Notes (Signed)
Pt daughter Meliton Rattan called and would like an update on pt [phone]

## 2020-07-10 NOTE — ED Notes (Signed)
MD to bedside. Pt continues to fuss about leaving if she does not get a bed tonight.

## 2020-07-10 NOTE — ED Notes (Addendum)
Pt is very upset about the wait time for her blood because her dr stated " you have a life threatening condition get to the ER and get blood now" and states she had to wait to get to room then she had to wait for blood transfusion.  She is stating she will not spend the night in the ED. She states she would like to be transferred to [facility] long or she will get up and leave. This RN apologized for the wait and  Informed Pt that blood has just recentely became available. MD notified.

## 2020-07-10 NOTE — ED Triage Notes (Signed)
Pt st's she had blood drawn yesterday and today was called and told to come to the ED ref low hgb.  Pt c/o shortness of breath and fatigued

## 2020-07-11 DIAGNOSIS — E876 Hypokalemia: Secondary | ICD-10-CM | POA: Diagnosis present

## 2020-07-11 DIAGNOSIS — K5730 Diverticulosis of large intestine without perforation or abscess without bleeding: Secondary | ICD-10-CM | POA: Diagnosis present

## 2020-07-11 DIAGNOSIS — Z20822 Contact with and (suspected) exposure to covid-19: Secondary | ICD-10-CM | POA: Diagnosis present

## 2020-07-11 DIAGNOSIS — E785 Hyperlipidemia, unspecified: Secondary | ICD-10-CM | POA: Diagnosis present

## 2020-07-11 DIAGNOSIS — Z7984 Long term (current) use of oral hypoglycemic drugs: Secondary | ICD-10-CM | POA: Diagnosis not present

## 2020-07-11 DIAGNOSIS — Z7982 Long term (current) use of aspirin: Secondary | ICD-10-CM | POA: Diagnosis not present

## 2020-07-11 DIAGNOSIS — N1831 Chronic kidney disease, stage 3a: Secondary | ICD-10-CM | POA: Diagnosis present

## 2020-07-11 DIAGNOSIS — N179 Acute kidney failure, unspecified: Secondary | ICD-10-CM | POA: Diagnosis present

## 2020-07-11 DIAGNOSIS — Z90710 Acquired absence of both cervix and uterus: Secondary | ICD-10-CM | POA: Diagnosis not present

## 2020-07-11 DIAGNOSIS — Z207 Contact with and (suspected) exposure to pediculosis, acariasis and other infestations: Secondary | ICD-10-CM | POA: Diagnosis present

## 2020-07-11 DIAGNOSIS — K2900 Acute gastritis without bleeding: Secondary | ICD-10-CM | POA: Diagnosis present

## 2020-07-11 DIAGNOSIS — K31819 Angiodysplasia of stomach and duodenum without bleeding: Secondary | ICD-10-CM | POA: Diagnosis present

## 2020-07-11 DIAGNOSIS — E1122 Type 2 diabetes mellitus with diabetic chronic kidney disease: Secondary | ICD-10-CM | POA: Diagnosis present

## 2020-07-11 DIAGNOSIS — K5520 Angiodysplasia of colon without hemorrhage: Secondary | ICD-10-CM | POA: Diagnosis present

## 2020-07-11 DIAGNOSIS — Z79899 Other long term (current) drug therapy: Secondary | ICD-10-CM | POA: Diagnosis not present

## 2020-07-11 DIAGNOSIS — I10 Essential (primary) hypertension: Secondary | ICD-10-CM | POA: Diagnosis not present

## 2020-07-11 DIAGNOSIS — D62 Acute posthemorrhagic anemia: Secondary | ICD-10-CM | POA: Diagnosis present

## 2020-07-11 DIAGNOSIS — I252 Old myocardial infarction: Secondary | ICD-10-CM | POA: Diagnosis not present

## 2020-07-11 DIAGNOSIS — I129 Hypertensive chronic kidney disease with stage 1 through stage 4 chronic kidney disease, or unspecified chronic kidney disease: Secondary | ICD-10-CM | POA: Diagnosis present

## 2020-07-11 DIAGNOSIS — K644 Residual hemorrhoidal skin tags: Secondary | ICD-10-CM | POA: Diagnosis present

## 2020-07-11 DIAGNOSIS — K641 Second degree hemorrhoids: Secondary | ICD-10-CM | POA: Diagnosis present

## 2020-07-11 DIAGNOSIS — Z87891 Personal history of nicotine dependence: Secondary | ICD-10-CM | POA: Diagnosis not present

## 2020-07-11 DIAGNOSIS — D649 Anemia, unspecified: Secondary | ICD-10-CM | POA: Diagnosis not present

## 2020-07-11 DIAGNOSIS — D519 Vitamin B12 deficiency anemia, unspecified: Secondary | ICD-10-CM | POA: Diagnosis present

## 2020-07-11 LAB — TYPE AND SCREEN
ABO/RH(D): B POS
Antibody Screen: POSITIVE
Donor AG Type: NEGATIVE
Donor AG Type: NEGATIVE
Unit division: 0
Unit division: 0

## 2020-07-11 LAB — BASIC METABOLIC PANEL
Anion gap: 11 (ref 5–15)
BUN: 18 mg/dL (ref 8–23)
CO2: 23 mmol/L (ref 22–32)
Calcium: 9 mg/dL (ref 8.9–10.3)
Chloride: 108 mmol/L (ref 98–111)
Creatinine, Ser: 1.06 mg/dL — ABNORMAL HIGH (ref 0.44–1.00)
GFR calc Af Amer: 58 mL/min — ABNORMAL LOW (ref >60)
GFR calc non Af Amer: 50 mL/min — ABNORMAL LOW (ref >60)
Glucose, Bld: 115 mg/dL — ABNORMAL HIGH (ref 70–99)
Potassium: 3.4 mmol/L — ABNORMAL LOW (ref 3.5–5.1)
Sodium: 142 mmol/L (ref 135–145)

## 2020-07-11 LAB — BPAM RBC
Blood Product Expiration Date: 202109012359
Blood Product Expiration Date: 202109012359
ISSUE DATE / TIME: 202108032058
ISSUE DATE / TIME: 202108032343
Unit Type and Rh: 7300
Unit Type and Rh: 7300

## 2020-07-11 LAB — CBG MONITORING, ED
Glucose-Capillary: 112 mg/dL — ABNORMAL HIGH (ref 70–99)
Glucose-Capillary: 141 mg/dL — ABNORMAL HIGH (ref 70–99)
Glucose-Capillary: 129 mg/dL — ABNORMAL HIGH (ref 70–99)

## 2020-07-11 LAB — SAVE SMEAR(SSMR), FOR PROVIDER SLIDE REVIEW

## 2020-07-11 LAB — CBC
HCT: 23.9 % — ABNORMAL LOW (ref 36.0–46.0)
Hemoglobin: 7.4 g/dL — ABNORMAL LOW (ref 12.0–15.0)
MCH: 24.4 pg — ABNORMAL LOW (ref 26.0–34.0)
MCHC: 31 g/dL (ref 30.0–36.0)
MCV: 78.9 fL — ABNORMAL LOW (ref 80.0–100.0)
Platelets: 247 10*3/uL (ref 150–400)
RBC: 3.03 MIL/uL — ABNORMAL LOW (ref 3.87–5.11)
RDW: 18.7 % — ABNORMAL HIGH (ref 11.5–15.5)
WBC: 7.5 10*3/uL (ref 4.0–10.5)
nRBC: 0.5 % — ABNORMAL HIGH (ref 0.0–0.2)

## 2020-07-11 LAB — IRON AND TIBC
Iron: 26 ug/dL — ABNORMAL LOW (ref 28–170)
Saturation Ratios: 5 % — ABNORMAL LOW (ref 10.4–31.8)
TIBC: 526 ug/dL — ABNORMAL HIGH (ref 250–450)
UIBC: 500 ug/dL

## 2020-07-11 LAB — HEMOGLOBIN AND HEMATOCRIT, BLOOD
HCT: 28.2 % — ABNORMAL LOW (ref 36.0–46.0)
Hemoglobin: 8.5 g/dL — ABNORMAL LOW (ref 12.0–15.0)

## 2020-07-11 LAB — RETICULOCYTES
Immature Retic Fract: 38.2 % — ABNORMAL HIGH (ref 2.3–15.9)
RBC.: 3.05 MIL/uL — ABNORMAL LOW (ref 3.87–5.11)
Retic Count, Absolute: 71.4 10*3/uL (ref 19.0–186.0)
Retic Ct Pct: 2.3 % (ref 0.4–3.1)

## 2020-07-11 LAB — HEMOGLOBIN A1C
Hgb A1c MFr Bld: 5.6 % (ref 4.8–5.6)
Mean Plasma Glucose: 114.02 mg/dL

## 2020-07-11 LAB — FERRITIN: Ferritin: 12 ng/mL (ref 11–307)

## 2020-07-11 LAB — VITAMIN B12: Vitamin B-12: 250 pg/mL (ref 180–914)

## 2020-07-11 LAB — GLUCOSE, CAPILLARY: Glucose-Capillary: 171 mg/dL — ABNORMAL HIGH (ref 70–99)

## 2020-07-11 MED ORDER — POTASSIUM CHLORIDE CRYS ER 20 MEQ PO TBCR
40.0000 meq | EXTENDED_RELEASE_TABLET | Freq: Once | ORAL | Status: AC
  Administered 2020-07-11: 40 meq via ORAL
  Filled 2020-07-11: qty 2

## 2020-07-11 MED ORDER — ACETAMINOPHEN 325 MG PO TABS: 650.0000 mg | ORAL_TABLET | Freq: Four times a day (QID) | ORAL | Status: DC | PRN

## 2020-07-11 MED ORDER — ASPIRIN EC 81 MG PO TBEC
81.0000 mg | DELAYED_RELEASE_TABLET | Freq: Every day | ORAL | Status: DC
  Administered 2020-07-11 – 2020-07-12 (×2): 81 mg via ORAL
  Filled 2020-07-11 (×2): qty 1

## 2020-07-11 MED ORDER — PANTOPRAZOLE SODIUM 40 MG IV SOLR
40.0000 mg | Freq: Two times a day (BID) | INTRAVENOUS | Status: DC
  Administered 2020-07-11 – 2020-07-12 (×2): 40 mg via INTRAVENOUS
  Filled 2020-07-11 (×2): qty 40

## 2020-07-11 MED ORDER — HYDROCHLOROTHIAZIDE 25 MG PO TABS: 25.0000 mg | ORAL_TABLET | Freq: Every day | ORAL | Status: DC

## 2020-07-11 MED ORDER — POLYETHYLENE GLYCOL 3350 17 G PO PACK: 17.0000 g | PACK | Freq: Every day | ORAL | Status: DC | PRN

## 2020-07-11 MED ORDER — INSULIN ASPART 100 UNIT/ML ~~LOC~~ SOLN
0.0000 [IU] | Freq: Three times a day (TID) | SUBCUTANEOUS | Status: DC
  Administered 2020-07-12: 1 [IU] via SUBCUTANEOUS

## 2020-07-11 MED ORDER — PEG 3350-KCL-NA BICARB-NACL 420 G PO SOLR
4000.0000 mL | Freq: Once | ORAL | Status: AC
  Administered 2020-07-11: 4000 mL via ORAL
  Filled 2020-07-11: qty 4000

## 2020-07-11 MED ORDER — ACETAMINOPHEN 650 MG RE SUPP: 650.0000 mg | Freq: Four times a day (QID) | RECTAL | Status: DC | PRN

## 2020-07-11 MED ORDER — METOPROLOL SUCCINATE ER 25 MG PO TB24
25.0000 mg | ORAL_TABLET | Freq: Every day | ORAL | Status: DC
  Administered 2020-07-12: 25 mg via ORAL
  Filled 2020-07-11 (×2): qty 1

## 2020-07-11 MED ORDER — ONDANSETRON HCL 4 MG PO TABS: 4.0000 mg | ORAL_TABLET | Freq: Four times a day (QID) | ORAL | Status: DC | PRN

## 2020-07-11 MED ORDER — METOPROLOL TARTRATE 5 MG/5ML IV SOLN: 5.0000 mg | Freq: Four times a day (QID) | INTRAVENOUS | Status: DC | PRN

## 2020-07-11 MED ORDER — METFORMIN HCL ER 500 MG PO TB24: 500.0000 mg | ORAL_TABLET | Freq: Every day | ORAL | Status: DC

## 2020-07-11 MED ORDER — TRAZODONE HCL 50 MG PO TABS: 25.0000 mg | ORAL_TABLET | Freq: Every evening | ORAL | Status: DC | PRN

## 2020-07-11 MED ORDER — AMLODIPINE BESYLATE 5 MG PO TABS
5.0000 mg | ORAL_TABLET | Freq: Every day | ORAL | Status: DC
  Administered 2020-07-11 – 2020-07-12 (×2): 5 mg via ORAL
  Filled 2020-07-11 (×2): qty 1

## 2020-07-11 MED ORDER — ROSUVASTATIN CALCIUM 5 MG PO TABS
10.0000 mg | ORAL_TABLET | Freq: Every day | ORAL | Status: DC
  Administered 2020-07-11 – 2020-07-12 (×2): 10 mg via ORAL
  Filled 2020-07-11 (×2): qty 2

## 2020-07-11 MED ORDER — ONDANSETRON HCL 4 MG/2ML IJ SOLN: 4.0000 mg | Freq: Four times a day (QID) | INTRAMUSCULAR | Status: DC | PRN

## 2020-07-11 MED ORDER — LISINOPRIL-HYDROCHLOROTHIAZIDE 20-25 MG PO TABS: 1.0000 | ORAL_TABLET | Freq: Every day | ORAL | Status: DC

## 2020-07-11 MED ORDER — INSULIN ASPART 100 UNIT/ML ~~LOC~~ SOLN: 0.0000 [IU] | Freq: Every day | SUBCUTANEOUS | Status: DC

## 2020-07-11 MED ORDER — LISINOPRIL 20 MG PO TABS: 20.0000 mg | ORAL_TABLET | Freq: Every day | ORAL | Status: DC

## 2020-07-11 MED ORDER — VITAMIN B-12 1000 MCG PO TABS
1000.0000 ug | ORAL_TABLET | Freq: Every day | ORAL | Status: DC
  Administered 2020-07-11 – 2020-07-12 (×2): 1000 ug via ORAL
  Filled 2020-07-11 (×2): qty 1

## 2020-07-11 NOTE — ED Notes (Signed)
Ordered Breakfast--Jasmin Miller  

## 2020-07-11 NOTE — Progress Notes (Signed)
PROGRESS NOTE    Jasmin Miller  XBW:[id number] DOB: 1941-04-22 DOA: 07/10/2020 PCP: Lindaann Pascal, PA-C   Brief Narrative:  Jasmin Miller is a 79 y.o. female with history of B12 anemia, HTN, DM2, who presents at direction of her PCP due to a low hgb found on recent labs.   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 Reports appetite has been poor over that time  Reports has been with a client who has bedbugs and has found multiple bites on herself Threw clothes in dryer at home Has not found any at home Exterminator has come to her home Has been one week since she discovered this  In the ED VS unremarkable, labs notable for hgb 4.6 with MCV 75 and normal WBC/platelets, CMP with Cr 1.27, neg fecal occult, normal CXR, unremarkable ECG. She was ordered for 2u pRBC and admitted for symptomatic anemia.   Assessment & Plan:   Active Problems:   Symptomatic anemia   Unintentional weight loss   HTN (hypertension)   DM2 (diabetes mellitus, type 2) (HCC)   Hyperlipidemia   Microcytic anemia   AKI (acute kidney injury) (HCC)   Acute blood loss anemia   Symptomatic Anemia/microcytic anemia/presumed acute  blood loss anemia/presumed GI bleed Patient presenting with symptomatic anemia, last hgb check from almost two years prior was 8.5 and macrocytic at that time.  She presented with hemoglobin of 4.6 and MCV of 75.  Received 2 units of PRBC transfusion.  Posttransfusion hemoglobin 7.4.  Iron studies indicate iron deficiency anemia.  She has normal B12.  Will check folate.  Interestingly, her fecal occult blood test is negative.  We will check another 1.  Patient does not recall when she has her last colonoscopy but it was probably in 1990s.  I have consulted GI for further evaluation with colonoscopy and possibly EGD and have spoken to Dr. Bosie Clos in detail.  I was informed that GI would not be able to do any procedure today.  I will start her on clear liquid diet for now and let GI see her and switch her diet based on their plans.  I will start her on Protonix IV twice daily and check hemoglobin at 8 PM and tomorrow morning.  #Intentional weight loss: Interestingly, H&P has documented that patient had unintentional weight loss of 20 pounds.  Upon further questioning, patient tells me that she has lost 20 pounds in 2 years and that was completely intentional.  #AKI/CKD stage IIIa: Last known creatinine was from December 2019.  It was 1.14.  She presented with 1.27 which improved little bit.  Her GFR falls in the range of CKD  stage IIIa.  Tomorrow morning's labs will further help determine whether this is acute versus chronic kidney disease.  Avoid nephrotoxic agents in the meantime.   #Exposure to bedbugs Reports recent exposure to bedbugs with lesions on back and arms, no bedbugs seen but will put on contact precautions.   HTN: Blood pressure fairly controlled.  Cont amlodipine 5, metop 25 daily, but hold lisinopril-hctz 20-25 .  HLD: cont rosuvastatin 10 daily  DM2: Hold Metformin.  Continue SSI.  Hypokalemia: 3.4.  Replace.  DVT prophylaxis: SCDs Start: 07/11/20 0201   Code Status: Full Code    Family Communication:  None present at bedside.  Plan of care discussed with patient in length and he verbalized understanding and agreed with it.  Status is: Inpatient  Remains inpatient appropriate because:Ongoing diagnostic testing needed not appropriate for outpatient work up   Dispo: The patient is from: Home              Anticipated d/c is to: Home              Anticipated d/c date is: 1 day              Patient currently is not medically stable to d/c.        Estimated body mass index is 30.61 kg/m as calculated from the following:   Height as of this encounter: 5\' 1"  (1.549 m).   Weight as of this encounter: 73.5 kg.      Nutritional status:               Consultants:   GI  Procedures:   None  Antimicrobials:  Anti-infectives (From admission, onward)   None         Subjective: Patient seen and examined early this morning in the ED.  Right off the bat, patient was very angry and upset at me mainly because she claims that she had a very poor nursing care overnight.  She tells me that there was a lot of noise in the room next to her so she could not sleep well.  She was also upset due to the fact that she has been in the ED for very long time and has not received a bed on the floor yet.  She was also upset that she was told by admitting physician that she will stay overnight while I was telling her that due to significantly low hemoglobin, she deserves further inpatient work-up with GI with colonoscopy and EGD.  She was also upset due to the fact that she had an IV line and telemetry monitoring.  She was also upset due to the fact that her monitor was beeping intermittently and that was bothering her.  I spent about 25 minutes at the bedside with this patient this morning when I tried to explain to her the rationale behind line/monitoring and all that.  I also apologize for poor nursing care that she had claimed to receive.  I also conveyed her concerns  today primary nurse.  I also advised RN to reach out to Publishing copynursing director or charge nurse if she has further concerns.  After all that, she told me that she would like to leave.  I strongly discouraged leaving.  She was finally convinced only to later threatened once again at around noontime that she wants to leave but she is now willing to sign AMA.  She demanded for me to call her in her room.  I called her and listened to her  same complaints from last night over again.  I reassured her once again and convinced her to stay overnight however I am not sure if she will follow recommendation/remain compliant.  Objective: Vitals:   07/11/20 1214 07/11/20 1215 07/11/20 1216 07/11/20 1217  BP:      Pulse: (!) 101 (!) 101 97 (!) 109  Resp: (!) 25 (!) 21 (!) 22 (!) 25  Temp:      TempSrc:      SpO2: 97% 96% 97% 97%  Weight:      Height:        Intake/Output Summary (Last 24 hours) at 07/11/2020 1403 Last data filed at 07/11/2020 0248 Gross per 24 hour  Intake 945 ml  Output --  Net 945 ml   Filed Weights   07/10/20 1545  Weight: 73.5 kg    Examination:  General exam: Angry and upset Respiratory system: Clear to auscultation. Respiratory effort normal. Cardiovascular system: S1 & S2 heard, RRR. No JVD, murmurs, rubs, gallops or clicks. No pedal edema. Gastrointestinal system: Abdomen is nondistended, soft and nontender. No organomegaly or masses felt. Normal bowel sounds heard. Central nervous system: Alert and oriented. No focal neurological deficits. Extremities: Symmetric 5 x 5 power. Skin: No rashes, lesions or ulcers    Data Reviewed: I have personally reviewed following labs and imaging studies  CBC: Recent Labs  Lab 07/10/20 1613 07/11/20 0644  WBC 6.7 7.5  NEUTROABS 4.4  --   HGB 4.6* 7.4*  HCT 16.5* 23.9*  MCV 75.3* 78.9*  PLT 300 247   Basic Metabolic Panel: Recent Labs  Lab 07/10/20 1613 07/11/20 0644  NA 142 142  K 3.4* 3.4*  CL 108 108  CO2 24 23  GLUCOSE  93 115*  BUN 21 18  CREATININE 1.27* 1.06*  CALCIUM 8.9 9.0   GFR: Estimated Creatinine Clearance: 39.5 mL/min (A) (by C-G formula based on SCr of 1.06 mg/dL (H)). Liver Function Tests: Recent Labs  Lab 07/10/20 1613  AST 22  ALT 22  ALKPHOS 72  BILITOT 0.4  PROT 6.7  ALBUMIN 3.4*   No results for input(s): LIPASE, AMYLASE in the last 168 hours. No results for input(s): AMMONIA in the last 168 hours. Coagulation Profile: No results for input(s): INR, PROTIME in the last 168 hours. Cardiac Enzymes: No results for input(s): CKTOTAL, CKMB, CKMBINDEX, TROPONINI in the last 168 hours. BNP (last 3 results) No results for input(s): PROBNP in the last 8760 hours. HbA1C: Recent Labs    07/11/20 0644  HGBA1C 5.6   CBG: Recent Labs  Lab 07/11/20 0758 07/11/20 1159  GLUCAP 112* 141*   Lipid Profile: No results for input(s): CHOL, HDL, LDLCALC, TRIG, CHOLHDL, LDLDIRECT in the last 72 hours. Thyroid Function Tests: No results for input(s): TSH, T4TOTAL, FREET4, T3FREE, THYROIDAB in the last 72 hours. Anemia Panel: Recent Labs    07/11/20 0644  VITAMINB12 250  FERRITIN 12  TIBC 526*  IRON 26*  RETICCTPCT 2.3   Sepsis Labs: No results for input(s): PROCALCITON, LATICACIDVEN in the last 168 hours.  Recent Results (from the past 240 hour(s))  SARS Coronavirus 2 by RT PCR (hospital order, performed in [facility] hospital lab) Nasopharyngeal Nasopharyngeal Swab     Status: None   Collection Time: 07/10/20  7:00 PM   Specimen: Nasopharyngeal Swab  Result Value Ref Range Status   SARS Coronavirus 2 NEGATIVE NEGATIVE Final    Comment: (NOTE) SARS-CoV-2 target nucleic acids are NOT DETECTED.  The SARS-CoV-2 RNA is  generally detectable in upper and lower respiratory specimens during the acute phase of infection. The lowest concentration of SARS-CoV-2 viral copies this assay can detect is 250 copies / mL. A negative result does not preclude SARS-CoV-2 infection and should  not be used as the sole basis for treatment or other patient management decisions.  A negative result may occur with improper specimen collection / handling, submission of specimen other than nasopharyngeal swab, presence of viral mutation(s) within the areas targeted by this assay, and inadequate number of viral copies (<250 copies / mL). A negative result must be combined with clinical observations, patient history, and epidemiological information.  Fact Sheet for Patients:   BoilerBrush.com.cy  Fact Sheet for Healthcare Providers: https://pope.com/  This test is not yet approved or  cleared by the [place] FDA and has been authorized for detection and/or diagnosis of SARS-CoV-2 by FDA under an Emergency Use Authorization (EUA).  This EUA will remain in effect (meaning this test can be used) for the duration of the COVID-19 declaration under Section 564(b)(1) of the Act, 21 U.S.C. section 360bbb-3(b)(1), unless the authorization is terminated or revoked sooner.  Performed at [facility], [address], [place], [place] [address]       Radiology Studies: DG Chest Port 1 View  Result Date: 07/10/2020 CLINICAL DATA:  Shortness of breath, low hemoglobin EXAM: PORTABLE CHEST 1 VIEW COMPARISON:  Radiograph 11/17/2018 FINDINGS: No consolidation, features of edema, pneumothorax, or effusion. The aorta is calcified. The remaining cardiomediastinal contours are unremarkable. No acute osseous or soft tissue abnormality. Degenerative changes are present in the imaged spine and shoulders. IMPRESSION: No acute cardiopulmonary abnormality. Aortic Atherosclerosis (ICD10-I70.0). Electronically Signed   By: Kreg Shropshire M.D.   On: 07/10/2020 18:36    Scheduled Meds:  amLODipine  5 mg Oral Daily   aspirin EC  81 mg Oral Daily   insulin aspart  0-5 Units Subcutaneous QHS   insulin aspart  0-9 Units Subcutaneous TID WC   metoprolol  succinate  25 mg Oral Daily   rosuvastatin  10 mg Oral Daily   vitamin B-12  1,000 mcg Oral Daily   Continuous Infusions:   LOS: 0 days   Time spent: 40 minutes   Hughie Closs, MD [facility]  07/11/2020, 2:03 PM   To contact the attending provider between 7A-7P or the covering provider during after hours 7P-7A, please log into the web site [email or web address].

## 2020-07-11 NOTE — H&P (View-Only) (Signed)
Referring Provider: Dr. Shahmehdi °Primary Care Physician:  Long, Scott, PA-C °Primary Gastroenterologist:  Unassigned ° °Reason for Consultation:  Anemia ° °HPI: Jasmin Miller is a 79 y.o. female with history of B12 deficiency anemia and DM type II presenting for consultation of iron-deficiency anemia. ° °Patient reports fatigue and dyspnea on exertion for the last 2 weeks, and found that she was anemic after visiting PCP.  She denies seeing any melena or hematochezia.  Denies any recent changes in stool.  She note  unintentional weight loss of 20lbs over the last year. ° °Denies abdominal pain, nausea, vomiting, hematemesis, dysphagia, GERD, decreased appetite. ° °Takes 81 mg aspirin daily but denies blood thinner or NSAID use. ° °Denies family history of colon cancer or gastrointestinal malignancy.  She states she had a colonoscopy approximately 25 years ago but she does not recall the results. ° ° °Past Medical History:  °Diagnosis Date  °• DM2 (diabetes mellitus, type 2) (HCC)   °• Hypertension   °• MI (myocardial infarction) (HCC)   °• Symptomatic anemia   ° 11/2018  ° ° °Past Surgical History:  °Procedure Laterality Date  °• ABDOMINAL HYSTERECTOMY    ° ° °Prior to Admission medications   °Medication Sig Start Date End Date Taking? Authorizing Provider  °amLODipine (NORVASC) 5 MG tablet Take 5 mg by mouth daily.   Yes [provider]  °aspirin EC 81 MG tablet Take 81 mg by mouth daily.   Yes [provider]  °Cholecalciferol (VITAMIN D3 PO) Take 1 capsule by mouth daily.   Yes [provider]  °lisinopril-hydrochlorothiazide (PRINZIDE,ZESTORETIC) 20-25 MG tablet Take 1 tablet by mouth daily.   Yes [provider]  °metFORMIN (GLUCOPHAGE-XR) 500 MG 24 hr tablet Take 500 mg by mouth daily with breakfast.   Yes [provider]  °metoprolol succinate (TOPROL-XL) 25 MG 24 hr tablet Take 25 mg by mouth daily. 07/03/20  Yes [provider]  °rosuvastatin (CRESTOR)  10 MG tablet Take 10 mg by mouth daily. 07/01/20  Yes [provider]  °triamcinolone cream (KENALOG) 0.1 % Apply 1 application topically every 8 (eight) hours as needed (itching).    Yes [provider]  °vitamin B-12 (CYANOCOBALAMIN) 1000 MCG tablet Take 1,000 mcg by mouth once a week. On Mondays   Yes [provider]  ° ° °Scheduled Meds: °• amLODipine  5 mg Oral Daily  °• aspirin EC  81 mg Oral Daily  °• insulin aspart  0-5 Units Subcutaneous QHS  °• insulin aspart  0-9 Units Subcutaneous TID WC  °• metoprolol succinate  25 mg Oral Daily  °• pantoprazole (PROTONIX) IV  40 mg Intravenous Q12H  °• potassium chloride  40 mEq Oral Once  °• rosuvastatin  10 mg Oral Daily  °• vitamin B-12  1,000 mcg Oral Daily  ° °Continuous Infusions: °PRN Meds:.acetaminophen **OR** acetaminophen, metoprolol tartrate, ondansetron **OR** ondansetron (ZOFRAN) IV, polyethylene glycol, traZODone ° °Allergies as of 07/10/2020  °• (No Known Allergies)  ° ° °Family History  °Problem Relation Age of Onset  °• Anemia Mother   °• Stroke Mother   °• Anemia Daughter   ° ° °Social History  ° °Socioeconomic History  °• Marital status: Widowed  °  Spouse name: Not on file  °• Number of children: 4  °• Years of education: Not on file  °• Highest education level: Not on file  °Occupational History  °• Not on file  °Tobacco Use  °• Smoking status: Former Smoker  °    Packs/day: 1.00  °  Years: 29.00  °  Pack years: 29.00  °  Types: Cigarettes  °  Quit date: 1993  °  Years since quitting: 28.6  °• Smokeless tobacco: Never Used  °• Tobacco comment: SMOKED FOR 29 YEARS   °Vaping Use  °• Vaping Use: Never used  °Substance and Sexual Activity  °• Alcohol use: No  °• Drug use: No  °• Sexual activity: Not on file  °Other Topics Concern  °• Not on file  °Social History Narrative  °• Not on file  ° °Social Determinants of Health  ° °Financial Resource Strain:   °• Difficulty of Paying Living Expenses:   °Food Insecurity:   °• Worried  About Running Out of Food in the Last Year:   °• Ran Out of Food in the Last Year:   °Transportation Needs:   °• Lack of Transportation (Medical):   °• Lack of Transportation (Non-Medical):   °Physical Activity:   °• Days of Exercise per Week:   °• Minutes of Exercise per Session:   °Stress:   °• Feeling of Stress :   °Social Connections:   °• Frequency of Communication with Friends and Family:   °• Frequency of Social Gatherings with Friends and Family:   °• Attends Religious Services:   °• Active Member of Clubs or Organizations:   °• Attends Club or Organization Meetings:   °• Marital Status:   °Intimate Partner Violence:   °• Fear of Current or Ex-Partner:   °• Emotionally Abused:   °• Physically Abused:   °• Sexually Abused:   ° ° °Review of Systems: Review of Systems  °Constitutional: Positive for malaise/fatigue and weight loss. Negative for chills and fever.  °HENT: Negative for hearing loss and tinnitus.   °Eyes: Negative for pain and redness.  °Respiratory: Positive for shortness of breath. Negative for cough.   °Cardiovascular: Negative for chest pain and palpitations.  °Gastrointestinal: Negative for abdominal pain, blood in stool, constipation, diarrhea, heartburn, melena, nausea and vomiting.  °Genitourinary: Negative for flank pain and hematuria.  °Musculoskeletal: Negative for falls and joint pain.  °Skin: Negative for itching and rash.  °Neurological: Negative for seizures and loss of consciousness.  °Psychiatric/Behavioral: Negative for substance abuse. The patient is not nervous/anxious.   ° ° ° °Physical Exam: °Vital signs: °Vitals:  ° 07/11/20 1216 07/11/20 1217  °BP:    °Pulse: 97 (!) 109  °Resp: (!) 22 (!) 25  °Temp:    °SpO2: 97% 97%  ° °  °Physical Exam °Constitutional:   °   General: She is not in acute distress. °   Appearance: Normal appearance.  °HENT:  °   Head: Normocephalic and atraumatic.  °   Nose: Nose normal.  °   Mouth/Throat:  °   Mouth: Mucous membranes are moist.  °   Pharynx:  Oropharynx is clear.  °Eyes:  °   General: No scleral icterus. °   Extraocular Movements: Extraocular movements intact.  °   Comments: Conjunctival pallor  °Cardiovascular:  °   Rate and Rhythm: Normal rate and regular rhythm.  °   Pulses: Normal pulses.  °   Heart sounds: Normal heart sounds.  °Pulmonary:  °   Effort: Pulmonary effort is normal. No respiratory distress.  °   Breath sounds: Normal breath sounds.  °Abdominal:  °   General: Bowel sounds are normal. There is no distension.  °   Palpations: Abdomen is soft. There is no mass.  °     Tenderness: There is no abdominal tenderness. There is no guarding or rebound.  °   Hernia: No hernia is present.  °Musculoskeletal:     °   General: No swelling or tenderness.  °   Cervical back: Normal range of motion and neck supple.  °Skin: °   General: Skin is warm and dry.  °Neurological:  °   General: No focal deficit present.  °   Mental Status: She is alert and oriented to person, place, and time.  °Psychiatric:     °   Mood and Affect: Mood normal.     °   Behavior: Behavior normal.  ° ° ° °GI:  °Lab Results: °Recent Labs  °  07/10/20 °1613 07/11/20 °0644  °WBC 6.7 7.5  °HGB 4.6* 7.4*  °HCT 16.5* 23.9*  °PLT 300 247  ° °BMET °Recent Labs  °  07/10/20 °1613 07/11/20 °0644  °NA 142 142  °K 3.4* 3.4*  °CL 108 108  °CO2 24 23  °GLUCOSE 93 115*  °BUN 21 18  °CREATININE 1.27* 1.06*  °CALCIUM 8.9 9.0  ° °LFT °Recent Labs  °  07/10/20 °1613  °PROT 6.7  °ALBUMIN 3.4*  °AST 22  °ALT 22  °ALKPHOS 72  °BILITOT 0.4  ° °PT/INR °No results for input(s): LABPROT, INR in the last 72 hours. ° ° °Studies/Results: °DG Chest Port 1 View ° °Result Date: 07/10/2020 °CLINICAL DATA:  Shortness of breath, low hemoglobin EXAM: PORTABLE CHEST 1 VIEW COMPARISON:  Radiograph 11/17/2018 FINDINGS: No consolidation, features of edema, pneumothorax, or effusion. The aorta is calcified. The remaining cardiomediastinal contours are unremarkable. No acute osseous or soft tissue abnormality. Degenerative  changes are present in the imaged spine and shoulders. IMPRESSION: No acute cardiopulmonary abnormality. Aortic Atherosclerosis (ICD10-I70.0). Electronically Signed   By: Price  DeHay M.D.   On: 07/10/2020 18:36  ° ° °Impression: °Iron deficiency anemia °-Hemoglobin 4.6 on arrival yesterday °-Iron 26, iron saturation 5%, TIBC 526, consistent with iron deficiency.  Ferritin lower level of normal (12) °-FOBT negative yesterday, repeat pending ° °Plan: °EGD and colonoscopy tomorrow.  I thoroughly reviewed the procedures with the patient to include nature, alternatives, benefits, and risks (including but not limited to bleeding, infection, perforation, anesthesia/cardiac and pulmonary complications).  Patient verbalized understanding and gave verbal consent to proceed with EGD and colonoscopy. ° °Clear liquid diet with Nulytely prep this evening. ° °Continue supportive care with IV fluids. ° °Continue Protonix 40 mg IV twice daily. ° °Continue to monitor H&H with transfusion as needed to maintain hemoglobin greater than 7. ° °[facility] will follow. ° ° LOS: 0 days  ° °Shelaine Frie Baron-Johnson  PA-C °07/11/2020, 2:50 PM ° °Contact #  [phone] °

## 2020-07-11 NOTE — ED Notes (Signed)
Order to remain NPO was just acknowledged.Pt did not want breakfast tray so but requested something else. Pt just finished eating 2 packs of graham crackers and cheese with Water. He NPO time would start at 0945

## 2020-07-11 NOTE — ED Notes (Signed)
Patient states multiple times on how she is unhappy about being moved from room to room during her visit. Patient unhappy with her care. Patient states she would like to leave after receiving her second unit of blood. This RN tried to talk with patient and let her know that I understood her frustration, but patient still not happy. Charge nurse made aware.

## 2020-07-11 NOTE — ED Notes (Signed)
Jasmin Miller daughter [id number]

## 2020-07-11 NOTE — ED Notes (Signed)
Patient stated that she had to use the bathroom. This rn offered to bring in a bed side commode in patients room because she was currently getting a blood transfusion. This patient was not happy about using a bedside commode so I told her I would assist her to the bathroom I just need to get some help to locate an IV pole since her pump was attached to bed. Patient states "if its going to be all that ill just use the bed side commode." this RN went an brought a clean bedside commode into patients room and attached a bed pan so that patient could use the bathroom. This RN then asked the patient if she needed any help getting to the bedside commode being that she was A&O x4 and ambulatory. This rn was then ignored by the patient so this RN asked patient again if she needed any help. Patient states "NO I dont need any help" and proceeds to the bedside commode. This rn sees that patient got to the commode safely and left to give the patient some privacy. While patient was using the bathroom another patient was calling out bc she was soaking wet, so this RN assisted the EMT tech to help clean up patient in another room while patient was using the bedside commode. Patients pump was beeping so this RN returned back to the room to assist her pump in which she found the patient back in the bed with no complaints. When this RN returned again to stop patients blood, she then tells me that "you better watch it before you not have a job" because I did not leave her with toilet paper and her railing was left down. She then proceeded to tell me "you think that because im black and old that you know me and you dont I will have you job less" this RN then attempted to talk to the patient and state how I was sorry for her frustrations and that I didn't mean to seem as if she didn't matter. Patient continued to cut me off stating "ill have your job" charge nurse will be made aware.

## 2020-07-11 NOTE — Consult Note (Signed)
Referring Provider: Dr. Flossie Dibble Primary Care Physician:  Lindaann Pascal, PA-C Primary Gastroenterologist:  Gentry Fitz  Reason for Consultation:  Anemia  HPI: Jasmin Miller is a 79 y.o. female with history of B12 deficiency anemia and DM type II presenting for consultation of iron-deficiency anemia.  Patient reports fatigue and dyspnea on exertion for the last 2 weeks, and found that she was anemic after visiting PCP.  She denies seeing any melena or hematochezia.  Denies any recent changes in stool.  She note  unintentional weight loss of 20lbs over the last year.  Denies abdominal pain, nausea, vomiting, hematemesis, dysphagia, GERD, decreased appetite.  Takes 81 mg aspirin daily but denies blood thinner or NSAID use.  Denies family history of colon cancer or gastrointestinal malignancy.  She states she had a colonoscopy approximately 25 years ago but she does not recall the results.   Past Medical History:  Diagnosis Date   DM2 (diabetes mellitus, type 2) (HCC)    Hypertension    MI (myocardial infarction) (HCC)    Symptomatic anemia    11/2018    Past Surgical History:  Procedure Laterality Date   ABDOMINAL HYSTERECTOMY      Prior to Admission medications   Medication Sig Start Date End Date Taking? Authorizing Provider  amLODipine (NORVASC) 5 MG tablet Take 5 mg by mouth daily.   Yes [provider]  aspirin EC 81 MG tablet Take 81 mg by mouth daily.   Yes [provider]  Cholecalciferol (VITAMIN D3 PO) Take 1 capsule by mouth daily.   Yes [provider]  lisinopril-hydrochlorothiazide (PRINZIDE,ZESTORETIC) 20-25 MG tablet Take 1 tablet by mouth daily.   Yes [provider]  metFORMIN (GLUCOPHAGE-XR) 500 MG 24 hr tablet Take 500 mg by mouth daily with breakfast.   Yes [provider]  metoprolol succinate (TOPROL-XL) 25 MG 24 hr tablet Take 25 mg by mouth daily. 07/03/20  Yes [provider]  rosuvastatin (CRESTOR)  10 MG tablet Take 10 mg by mouth daily. 07/01/20  Yes [provider]  triamcinolone cream (KENALOG) 0.1 % Apply 1 application topically every 8 (eight) hours as needed (itching).    Yes [provider]  vitamin B-12 (CYANOCOBALAMIN) 1000 MCG tablet Take 1,000 mcg by mouth once a week. On Mondays   Yes [provider]    Scheduled Meds:  amLODipine  5 mg Oral Daily   aspirin EC  81 mg Oral Daily   insulin aspart  0-5 Units Subcutaneous QHS   insulin aspart  0-9 Units Subcutaneous TID WC   metoprolol succinate  25 mg Oral Daily   pantoprazole (PROTONIX) IV  40 mg Intravenous Q12H   potassium chloride  40 mEq Oral Once   rosuvastatin  10 mg Oral Daily   vitamin B-12  1,000 mcg Oral Daily   Continuous Infusions: PRN Meds:.acetaminophen **OR** acetaminophen, metoprolol tartrate, ondansetron **OR** ondansetron (ZOFRAN) IV, polyethylene glycol, traZODone  Allergies as of 07/10/2020   (No Known Allergies)    Family History  Problem Relation Age of Onset   Anemia Mother    Stroke Mother    Anemia Daughter     Social History   Socioeconomic History   Marital status: Widowed    Spouse name: Not on file   Number of children: 4   Years of education: Not on file   Highest education level: Not on file  Occupational History   Not on file  Tobacco Use   Smoking status: Former Smoker  Packs/day: 1.00    Years: 29.00    Pack years: 29.00    Types: Cigarettes    Quit date: 31    Years since quitting: 28.6   Smokeless tobacco: Never Used   Tobacco comment: SMOKED FOR 29 YEARS   Vaping Use   Vaping Use: Never used  Substance and Sexual Activity   Alcohol use: No   Drug use: No   Sexual activity: Not on file  Other Topics Concern   Not on file  Social History Narrative   Not on file   Social Determinants of Health   Financial Resource Strain:    Difficulty of Paying Living Expenses:   Food Insecurity:    Worried  About Programme researcher, broadcasting/film/video in the Last Year:    Barista in the Last Year:   Transportation Needs:    Freight forwarder (Medical):    Lack of Transportation (Non-Medical):   Physical Activity:    Days of Exercise per Week:    Minutes of Exercise per Session:   Stress:    Feeling of Stress :   Social Connections:    Frequency of Communication with Friends and Family:    Frequency of Social Gatherings with Friends and Family:    Attends Religious Services:    Active Member of Clubs or Organizations:    Attends Engineer, structural:    Marital Status:   Intimate Partner Violence:    Fear of Current or Ex-Partner:    Emotionally Abused:    Physically Abused:    Sexually Abused:     Review of Systems: Review of Systems  Constitutional: Positive for malaise/fatigue and weight loss. Negative for chills and fever.  HENT: Negative for hearing loss and tinnitus.   Eyes: Negative for pain and redness.  Respiratory: Positive for shortness of breath. Negative for cough.   Cardiovascular: Negative for chest pain and palpitations.  Gastrointestinal: Negative for abdominal pain, blood in stool, constipation, diarrhea, heartburn, melena, nausea and vomiting.  Genitourinary: Negative for flank pain and hematuria.  Musculoskeletal: Negative for falls and joint pain.  Skin: Negative for itching and rash.  Neurological: Negative for seizures and loss of consciousness.  Psychiatric/Behavioral: Negative for substance abuse. The patient is not nervous/anxious.      Physical Exam: Vital signs: Vitals:   07/11/20 1216 07/11/20 1217  BP:    Pulse: 97 (!) 109  Resp: (!) 22 (!) 25  Temp:    SpO2: 97% 97%     Physical Exam Constitutional:      General: She is not in acute distress.    Appearance: Normal appearance.  HENT:     Head: Normocephalic and atraumatic.     Nose: Nose normal.     Mouth/Throat:     Mouth: Mucous membranes are moist.     Pharynx:  Oropharynx is clear.  Eyes:     General: No scleral icterus.    Extraocular Movements: Extraocular movements intact.     Comments: Conjunctival pallor  Cardiovascular:     Rate and Rhythm: Normal rate and regular rhythm.     Pulses: Normal pulses.     Heart sounds: Normal heart sounds.  Pulmonary:     Effort: Pulmonary effort is normal. No respiratory distress.     Breath sounds: Normal breath sounds.  Abdominal:     General: Bowel sounds are normal. There is no distension.     Palpations: Abdomen is soft. There is no mass.  Tenderness: There is no abdominal tenderness. There is no guarding or rebound.     Hernia: No hernia is present.  Musculoskeletal:        General: No swelling or tenderness.     Cervical back: Normal range of motion and neck supple.  Skin:    General: Skin is warm and dry.  Neurological:     General: No focal deficit present.     Mental Status: She is alert and oriented to person, place, and time.  Psychiatric:        Mood and Affect: Mood normal.        Behavior: Behavior normal.     GI:  Lab Results: Recent Labs    07/10/20 1613 07/11/20 0644  WBC 6.7 7.5  HGB 4.6* 7.4*  HCT 16.5* 23.9*  PLT 300 247   BMET Recent Labs    07/10/20 1613 07/11/20 0644  NA 142 142  K 3.4* 3.4*  CL 108 108  CO2 24 23  GLUCOSE 93 115*  BUN 21 18  CREATININE 1.27* 1.06*  CALCIUM 8.9 9.0   LFT Recent Labs    07/10/20 1613  PROT 6.7  ALBUMIN 3.4*  AST 22  ALT 22  ALKPHOS 72  BILITOT 0.4   PT/INR No results for input(s): LABPROT, INR in the last 72 hours.   Studies/Results: DG Chest Port 1 View  Result Date: 07/10/2020 CLINICAL DATA:  Shortness of breath, low hemoglobin EXAM: PORTABLE CHEST 1 VIEW COMPARISON:  Radiograph 11/17/2018 FINDINGS: No consolidation, features of edema, pneumothorax, or effusion. The aorta is calcified. The remaining cardiomediastinal contours are unremarkable. No acute osseous or soft tissue abnormality. Degenerative  changes are present in the imaged spine and shoulders. IMPRESSION: No acute cardiopulmonary abnormality. Aortic Atherosclerosis (ICD10-I70.0). Electronically Signed   By: Kreg Shropshire M.D.   On: 07/10/2020 18:36    Impression: Iron deficiency anemia -Hemoglobin 4.6 on arrival yesterday -Iron 26, iron saturation 5%, TIBC 526, consistent with iron deficiency.  Ferritin lower level of normal (12) -FOBT negative yesterday, repeat pending  Plan: EGD and colonoscopy tomorrow.  I thoroughly reviewed the procedures with the patient to include nature, alternatives, benefits, and risks (including but not limited to bleeding, infection, perforation, anesthesia/cardiac and pulmonary complications).  Patient verbalized understanding and gave verbal consent to proceed with EGD and colonoscopy.  Clear liquid diet with Nulytely prep this evening.  Continue supportive care with IV fluids.  Continue Protonix 40 mg IV twice daily.  Continue to monitor H&H with transfusion as needed to maintain hemoglobin greater than 7.  [facility] will follow.   LOS: 0 days   Edrick Kins  PA-C 07/11/2020, 2:50 PM  Contact #  [phone]

## 2020-07-12 ENCOUNTER — Encounter (HOSPITAL_COMMUNITY)
Admission: EM | Disposition: A | Discharge status: Home / Self Care | Payer: Self-pay | Source: Home / Self Care | Attending: Family Medicine

## 2020-07-12 ENCOUNTER — Inpatient Hospital Stay (HOSPITAL_COMMUNITY): Payer: Medicare HMO | Admitting: Anesthesiology

## 2020-07-12 ENCOUNTER — Encounter (HOSPITAL_COMMUNITY): Payer: Self-pay | Admitting: Family Medicine

## 2020-07-12 ENCOUNTER — Other Ambulatory Visit: Payer: Self-pay

## 2020-07-12 DIAGNOSIS — K2900 Acute gastritis without bleeding: Secondary | ICD-10-CM

## 2020-07-12 DIAGNOSIS — K5520 Angiodysplasia of colon without hemorrhage: Secondary | ICD-10-CM

## 2020-07-12 DIAGNOSIS — K31819 Angiodysplasia of stomach and duodenum without bleeding: Secondary | ICD-10-CM

## 2020-07-12 HISTORY — PX: HOT HEMOSTASIS: SHX5433

## 2020-07-12 HISTORY — PX: COLONOSCOPY: SHX174

## 2020-07-12 HISTORY — PX: BIOPSY: SHX5522

## 2020-07-12 HISTORY — PX: ESOPHAGOGASTRODUODENOSCOPY: SHX1529

## 2020-07-12 HISTORY — PX: ESOPHAGOGASTRODUODENOSCOPY: SHX5428

## 2020-07-12 HISTORY — PX: COLONOSCOPY WITH PROPOFOL: SHX5780

## 2020-07-12 LAB — CBC WITH DIFFERENTIAL/PLATELET
Abs Immature Granulocytes: 0.03 10*3/uL (ref 0.00–0.07)
Basophils Absolute: 0 10*3/uL (ref 0.0–0.1)
Basophils Relative: 1 %
Eosinophils Absolute: 0.4 10*3/uL (ref 0.0–0.5)
Eosinophils Relative: 6 %
HCT: 24.7 % — ABNORMAL LOW (ref 36.0–46.0)
Hemoglobin: 7.5 g/dL — ABNORMAL LOW (ref 12.0–15.0)
Immature Granulocytes: 0 %
Lymphocytes Relative: 14 %
Lymphs Abs: 1 10*3/uL (ref 0.7–4.0)
MCH: 24.2 pg — ABNORMAL LOW (ref 26.0–34.0)
MCHC: 30.4 g/dL (ref 30.0–36.0)
MCV: 79.7 fL — ABNORMAL LOW (ref 80.0–100.0)
Monocytes Absolute: 0.9 10*3/uL (ref 0.1–1.0)
Monocytes Relative: 12 %
Neutro Abs: 5 10*3/uL (ref 1.7–7.7)
Neutrophils Relative %: 67 %
Platelets: 241 10*3/uL (ref 150–400)
RBC: 3.1 MIL/uL — ABNORMAL LOW (ref 3.87–5.11)
RDW: 19.2 % — ABNORMAL HIGH (ref 11.5–15.5)
WBC: 7.3 10*3/uL (ref 4.0–10.5)
nRBC: 0 % (ref 0.0–0.2)

## 2020-07-12 LAB — BASIC METABOLIC PANEL
Anion gap: 10 (ref 5–15)
BUN: 10 mg/dL (ref 8–23)
CO2: 23 mmol/L (ref 22–32)
Calcium: 8.7 mg/dL — ABNORMAL LOW (ref 8.9–10.3)
Chloride: 111 mmol/L (ref 98–111)
Creatinine, Ser: 0.88 mg/dL (ref 0.44–1.00)
GFR calc Af Amer: >60 mL/min (ref >60)
GFR calc non Af Amer: >60 mL/min (ref >60)
Glucose, Bld: 109 mg/dL — ABNORMAL HIGH (ref 70–99)
Potassium: 4 mmol/L (ref 3.5–5.1)
Sodium: 144 mmol/L (ref 135–145)

## 2020-07-12 LAB — GLUCOSE, CAPILLARY
Glucose-Capillary: 119 mg/dL — ABNORMAL HIGH (ref 70–99)
Glucose-Capillary: 102 mg/dL — ABNORMAL HIGH (ref 70–99)
Glucose-Capillary: 110 mg/dL — ABNORMAL HIGH (ref 70–99)

## 2020-07-12 SURGERY — EGD (ESOPHAGOGASTRODUODENOSCOPY)
Anesthesia: Monitor Anesthesia Care

## 2020-07-12 MED ORDER — PROPOFOL 10 MG/ML IV BOLUS
INTRAVENOUS | Status: DC | PRN
  Administered 2020-07-12 (×3): 10 mg via INTRAVENOUS

## 2020-07-12 MED ORDER — SODIUM CHLORIDE 0.9 % IV SOLN: INTRAVENOUS | Status: DC

## 2020-07-12 MED ORDER — PROPOFOL 500 MG/50ML IV EMUL
INTRAVENOUS | Status: DC | PRN
  Administered 2020-07-12: 125 ug/kg/min via INTRAVENOUS

## 2020-07-12 MED ORDER — OMEPRAZOLE 40 MG PO CPDR: 40.0000 mg | DELAYED_RELEASE_CAPSULE | Freq: Two times a day (BID) | ORAL | 0 refills | Status: DC

## 2020-07-12 MED ORDER — PHENYLEPHRINE 40 MCG/ML (10ML) SYRINGE FOR IV PUSH (FOR BLOOD PRESSURE SUPPORT)
PREFILLED_SYRINGE | INTRAVENOUS | Status: DC | PRN
  Administered 2020-07-12: 80 ug via INTRAVENOUS
  Administered 2020-07-12: 120 ug via INTRAVENOUS

## 2020-07-12 MED ORDER — SODIUM CHLORIDE 0.9 % IV SOLN: INTRAVENOUS | Status: DC | PRN

## 2020-07-12 SURGICAL SUPPLY — 22 items

## 2020-07-12 NOTE — Discharge Instructions (Signed)

## 2020-07-12 NOTE — Discharge Summary (Signed)
Physician Discharge Summary  Jasmin Miller RUE:[id number] DOB: 1941/10/07 DOA: 07/10/2020  PCP: Lindaann Pascal, PA-C  Admit date: 07/10/2020 Discharge date: 07/12/2020  Admitted From: Home Disposition: Home  Recommendations for Outpatient Follow-up:  1. Follow up with PCP in 1-2 weeks 2. Follow with GI in 4 weeks for repeat colonoscopy 3. Please obtain BMP/CBC in one week 4. Please follow up with your PCP on the following pending results: Unresulted Labs (From admission, onward) Comment          Start     Ordered   07/12/20 0500  CBC with Differential/Platelet  Daily,   R      07/11/20 1413   07/10/20 2052  Pathologist smear review  Once,   STAT        07/10/20 2054           Home Health: None Equipment/Devices: None  Discharge Condition: Stable CODE STATUS: Full code Diet recommendation: Cardiac  Subjective: Seen and examined this morning.  Denied any hematemesis or black stool.  Brief/Interim Summary: Jasmin Miller a 79 y.o.femalewith history of B12 anemia, HTN, DM2, who presented at direction of her PCP due to a low hgb found on recent labs.  Patient was complaining of fatigue for 2 weeks. She went to her PCP and had blood drawn, was told that she needed to present to the ED for a critically low hemoglobin.  Upon arrival to ED, she was hemodynamically stable.  Her hemoglobin was 4.6.  MCV 75.  Previously she always had microcytic anemia due to B12 deficiency but this time it was microcytic anemia.  Fecal occult blood test was negative.  She was admitted to hospital service.  She received 2 unit of PRBC transfusion.  Posttransfusion her hemoglobin remained stable over 7.  Iron studies indicated iron deficiency anemia.  GI was consulted and patient underwent EGD as well as colonoscopy.  She had fair prep on the colonoscopy.  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  She also had acute gastritis.  Per GI, her AVMs  were likely the source of her chronic slow blood loss anemia.  They recommended follow-up in 4 weeks for repeat colonoscopy and cleared patient for discharge.  For acute gastritis, I am discharging this patient on omeprazole 40 mg p.o. twice daily for 4 weeks.  She is being discharged in stable condition.  Resume all home medications including low-dose aspirin.  Discharge Diagnoses:  Active Problems:   Symptomatic anemia   Unintentional weight loss   HTN (hypertension)   DM2 (diabetes mellitus, type 2) (HCC)   Hyperlipidemia   Microcytic anemia   AKI (acute kidney injury) (HCC)   Acute blood loss anemia   AVM (arteriovenous malformation) of duodenum, acquired   AVM (arteriovenous malformation) of colon   Acute gastritis without bleeding    Discharge Instructions   Allergies as of 07/12/2020   No Known Allergies     Medication List    TAKE these medications   amLODipine 5 MG tablet Commonly known as: NORVASC Take 5 mg by mouth daily.   aspirin EC 81 MG tablet Take 81 mg by mouth daily.   lisinopril-hydrochlorothiazide 20-25 MG tablet Commonly known as: ZESTORETIC Take 1 tablet by mouth daily.   metFORMIN 500 MG 24 hr tablet Commonly known as: GLUCOPHAGE-XR Take 500 mg by mouth daily with breakfast.   metoprolol succinate 25 MG 24 hr tablet Commonly known as: TOPROL-XL  Take 25 mg by mouth daily.   omeprazole 40 MG capsule Commonly known as: PRILOSEC Take 1 capsule (40 mg total) by mouth in the morning and at bedtime.   rosuvastatin 10 MG tablet Commonly known as: CRESTOR Take 10 mg by mouth daily.   triamcinolone cream 0.1 % Commonly known as: KENALOG Apply 1 application topically every 8 (eight) hours as needed (itching).   vitamin B-12 1000 MCG tablet Commonly known as: CYANOCOBALAMIN Take 1,000 mcg by mouth once a week. On Mondays   VITAMIN D3 PO Take 1 capsule by mouth daily.       Follow-up Information    Long, Scott, PA-C Follow up in 1 week(s).    Specialty: Physician Assistant Contact information: [address] [place] [address] [phone]        Charlott Rakes, MD Follow up in 4 week(s).   Specialty: Gastroenterology Contact information: [address] [place] [address] [phone]              No Known Allergies  Consultations: GI   Procedures/Studies: DG Chest Port 1 View  Result Date: 07/10/2020 CLINICAL DATA:  Shortness of breath, low hemoglobin EXAM: PORTABLE CHEST 1 VIEW COMPARISON:  Radiograph 11/17/2018 FINDINGS: No consolidation, features of edema, pneumothorax, or effusion. The aorta is calcified. The remaining cardiomediastinal contours are unremarkable. No acute osseous or soft tissue abnormality. Degenerative changes are present in the imaged spine and shoulders. IMPRESSION: No acute cardiopulmonary abnormality. Aortic Atherosclerosis (ICD10-I70.0). Electronically Signed   By: Kreg Shropshire M.D.   On: 07/10/2020 18:36      Discharge Exam: Vitals:   07/12/20 1415 07/12/20 1425  BP: (!) 117/37 137/64  Pulse: 93 84  Resp: (!) 23 (!) 23  Temp:    SpO2: 96% 100%   Vitals:   07/12/20 1122 07/12/20 1405 07/12/20 1415 07/12/20 1425  BP: (!) 178/64 (!) 111/34 (!) 117/37 137/64  Pulse: 93 93 93 84  Resp: (!) 21 (!) 22 (!) 23 (!) 23  Temp: 98.6 F (37 C)     TempSrc: Oral     SpO2: 99% 95% 96% 100%  Weight:      Height:        General: Pt is alert, awake, not in acute distress Cardiovascular: RRR, S1/S2 +, no rubs, no gallops Respiratory: CTA bilaterally, no wheezing, no rhonchi Abdominal: Soft, NT, ND, bowel sounds + Extremities: no edema, no cyanosis    The results of significant diagnostics from this hospitalization (including imaging, microbiology, ancillary and laboratory) are listed below for reference.     Microbiology: Recent Results (from the past 240 hour(s))  SARS Coronavirus 2 by RT PCR (hospital order, performed in [facility] hospital  lab) Nasopharyngeal Nasopharyngeal Swab     Status: None   Collection Time: 07/10/20  7:00 PM   Specimen: Nasopharyngeal Swab  Result Value Ref Range Status   SARS Coronavirus 2 NEGATIVE NEGATIVE Final    Comment: (NOTE) SARS-CoV-2 target nucleic acids are NOT DETECTED.  The SARS-CoV-2 RNA is generally detectable in upper and lower respiratory specimens during the acute phase of infection. The lowest concentration of SARS-CoV-2 viral copies this assay can detect is 250 copies / mL. A negative result does not preclude SARS-CoV-2 infection and should not be used as the sole basis for treatment or other patient management decisions.  A negative result may occur with improper specimen collection / handling, submission of specimen other than nasopharyngeal swab, presence of viral mutation(s) within the  areas targeted by this assay, and inadequate number of viral copies (<250 copies / mL). A negative result must be combined with clinical observations, patient history, and epidemiological information.  Fact Sheet for Patients:   BoilerBrush.com.cy  Fact Sheet for Healthcare Providers: https://pope.com/  This test is not yet approved or  cleared by the [place] FDA and has been authorized for detection and/or diagnosis of SARS-CoV-2 by FDA under an Emergency Use Authorization (EUA).  This EUA will remain in effect (meaning this test can be used) for the duration of the COVID-19 declaration under Section 564(b)(1) of the Act, 21 U.S.C. section 360bbb-3(b)(1), unless the authorization is terminated or revoked sooner.  Performed at [facility], [address], [place], [place] [address]      Labs: BNP (last 3 results) No results for input(s): BNP in the last 8760 hours. Basic Metabolic Panel: Recent Labs  Lab 07/10/20 1613 07/11/20 0644 07/12/20 0335  NA 142 142 144  K 3.4* 3.4* 4.0  CL 108 108 111  CO2 24 23 23    GLUCOSE 93 115* 109*  BUN 21 18 10   CREATININE 1.27* 1.06* 0.88  CALCIUM 8.9 9.0 8.7*   Liver Function Tests: Recent Labs  Lab 07/10/20 1613  AST 22  ALT 22  ALKPHOS 72  BILITOT 0.4  PROT 6.7  ALBUMIN 3.4*   No results for input(s): LIPASE, AMYLASE in the last 168 hours. No results for input(s): AMMONIA in the last 168 hours. CBC: Recent Labs  Lab 07/10/20 1613 07/11/20 0644 07/11/20 2114 07/12/20 0335  WBC 6.7 7.5  --  7.3  NEUTROABS 4.4  --   --  5.0  HGB 4.6* 7.4* 8.5* 7.5*  HCT 16.5* 23.9* 28.2* 24.7*  MCV 75.3* 78.9*  --  79.7*  PLT 300 247  --  241   Cardiac Enzymes: No results for input(s): CKTOTAL, CKMB, CKMBINDEX, TROPONINI in the last 168 hours. BNP: Invalid input(s): POCBNP CBG: Recent Labs  Lab 07/11/20 1159 07/11/20 1832 07/11/20 2124 07/12/20 0632 07/12/20 1127  GLUCAP 141* 129* 171* 119* 102*   D-Dimer No results for input(s): DDIMER in the last 72 hours. Hgb A1c Recent Labs    07/11/20 0644  HGBA1C 5.6   Lipid Profile No results for input(s): CHOL, HDL, LDLCALC, TRIG, CHOLHDL, LDLDIRECT in the last 72 hours. Thyroid function studies No results for input(s): TSH, T4TOTAL, T3FREE, THYROIDAB in the last 72 hours.  Invalid input(s): FREET3 Anemia work up Recent Labs    07/11/20 0644  VITAMINB12 250  FERRITIN 12  TIBC 526*  IRON 26*  RETICCTPCT 2.3   Urinalysis    Component Value Date/Time   COLORURINE STRAW (A) 11/17/2018 0126   APPEARANCEUR CLEAR 11/17/2018 0126   LABSPEC 1.010 11/17/2018 0126   PHURINE 6.0 11/17/2018 0126   GLUCOSEU NEGATIVE 11/17/2018 0126   HGBUR SMALL (A) 11/17/2018 0126   BILIRUBINUR NEGATIVE 11/17/2018 0126   KETONESUR NEGATIVE 11/17/2018 0126   PROTEINUR NEGATIVE 11/17/2018 0126   UROBILINOGEN 0.2 10/03/2015 1313   NITRITE NEGATIVE 11/17/2018 0126   LEUKOCYTESUR NEGATIVE 11/17/2018 0126   Sepsis Labs Invalid input(s): PROCALCITONIN,  WBC,  LACTICIDVEN Microbiology Recent Results (from the  past 240 hour(s))  SARS Coronavirus 2 by RT PCR (hospital order, performed in [facility] hospital lab) Nasopharyngeal Nasopharyngeal Swab     Status: None   Collection Time: 07/10/20  7:00 PM   Specimen: Nasopharyngeal Swab  Result Value Ref Range Status   SARS Coronavirus 2 NEGATIVE NEGATIVE Final  Comment: (NOTE) SARS-CoV-2 target nucleic acids are NOT DETECTED.  The SARS-CoV-2 RNA is generally detectable in upper and lower respiratory specimens during the acute phase of infection. The lowest concentration of SARS-CoV-2 viral copies this assay can detect is 250 copies / mL. A negative result does not preclude SARS-CoV-2 infection and should not be used as the sole basis for treatment or other patient management decisions.  A negative result may occur with improper specimen collection / handling, submission of specimen other than nasopharyngeal swab, presence of viral mutation(s) within the areas targeted by this assay, and inadequate number of viral copies (<250 copies / mL). A negative result must be combined with clinical observations, patient history, and epidemiological information.  Fact Sheet for Patients:   BoilerBrush.com.cyhttps://www.fda.gov/media/136312/download  Fact Sheet for Healthcare Providers: https://pope.com/https://www.fda.gov/media/136313/download  This test is not yet approved or  cleared by the [place] FDA and has been authorized for detection and/or diagnosis of SARS-CoV-2 by FDA under an Emergency Use Authorization (EUA).  This EUA will remain in effect (meaning this test can be used) for the duration of the COVID-19 declaration under Section 564(b)(1) of the Act, 21 U.S.C. section 360bbb-3(b)(1), unless the authorization is terminated or revoked sooner.  Performed at [facility], [address] [place], [place] [address]      Time coordinating discharge: Over 30 minutes  SIGNED:   Hughie Clossavi Donalda Job, MD  [facility] 07/12/2020, 2:44 PM  If 7PM-7AM, please  contact night-coverage [email or web address]

## 2020-07-12 NOTE — Transfer of Care (Addendum)
Immediate Anesthesia Transfer of Care Note  Patient: Jasmin Miller  Procedure(s) Performed: ESOPHAGOGASTRODUODENOSCOPY (EGD) (N/A ) COLONOSCOPY WITH PROPOFOL (N/A ) HOT HEMOSTASIS (ARGON PLASMA COAGULATION/BICAP) (N/A ) BIOPSY  Patient Location: PACU and Endoscopy Unit  Anesthesia Type:MAC  Level of Consciousness: drowsy, patient cooperative and responds to stimulation  Airway & Oxygen Therapy: Patient Spontanous Breathing  Post-op Assessment: Report given to RN and Post -op Vital signs reviewed and stable  Post vital signs: Reviewed and stable  Last Vitals:  Vitals Value Taken Time  BP 111/34 07/12/20 1406  Temp    Pulse 94 07/12/20 1407  Resp 22 07/12/20 1407  SpO2 96 % 07/12/20 1407  Vitals shown include unvalidated device data.  Last Pain:  Vitals:   07/12/20 1122  TempSrc: Oral  PainSc: 0-No pain         Complications: No complications documented.

## 2020-07-12 NOTE — Anesthesia Postprocedure Evaluation (Signed)
Anesthesia Post Note  Patient: Jasmin Miller  Procedure(s) Performed: ESOPHAGOGASTRODUODENOSCOPY (EGD) (N/A ) COLONOSCOPY WITH PROPOFOL (N/A ) HOT HEMOSTASIS (ARGON PLASMA COAGULATION/BICAP) (N/A ) BIOPSY     Patient location during evaluation: Endoscopy Anesthesia Type: MAC Level of consciousness: awake Pain management: pain level controlled Vital Signs Assessment: post-procedure vital signs reviewed and stable Respiratory status: spontaneous breathing Cardiovascular status: stable Postop Assessment: no apparent nausea or vomiting Anesthetic complications: no   No complications documented.  Last Vitals:  Vitals:   07/12/20 1435 07/12/20 1600  BP: 139/60 122/72  Pulse: 87 95  Resp: 19 17  Temp:  37 C  SpO2: 100% 99%    Last Pain:  Vitals:   07/12/20 1600  TempSrc: Oral  PainSc:                  Ashaunti Treptow

## 2020-07-12 NOTE — Plan of Care (Signed)

## 2020-07-12 NOTE — Op Note (Signed)
[facility] Patient Name: Jasmin Miller Procedure Date : 07/12/2020 MRN: [id number] Attending MD: Shirley Friar , MD Date of Birth: 05/02/1941 CSN: [id number] Age: 79 Admit Type: Inpatient Procedure:                Colonoscopy Indications:              This is the patient's first colonoscopy, Iron                            deficiency anemia Providers:                Shirley Friar, MD, Alison Murray, RN, Beryle Beams, Technician, Romie Minus, CRNA Referring MD:             hospital team Medicines:                Propofol per Anesthesia, Monitored Anesthesia Care Complications:            No immediate complications. Estimated Blood Loss:     Estimated blood loss was minimal. Procedure:                Pre-Anesthesia Assessment:                           - Prior to the procedure, a History and Physical                            was performed, and patient medications and                            allergies were reviewed. The patient's tolerance of                            previous anesthesia was also reviewed. The risks                            and benefits of the procedure and the sedation                            options and risks were discussed with the patient.                            All questions were answered, and informed consent                            was obtained. Prior Anticoagulants: The patient has                            taken no previous anticoagulant or antiplatelet                            agents. ASA Grade Assessment: III - A patient with  severe systemic disease. After reviewing the risks                            and benefits, the patient was deemed in                            satisfactory condition to undergo the procedure.                           After obtaining informed consent, the colonoscope                            was passed under direct vision. Throughout  the                            procedure, the patient's blood pressure, pulse, and                            oxygen saturations were monitored continuously. The                            PCF-H190DL ([id number] Olympus pediatric colonoscope                            was introduced through the anus and advanced to the                            the cecum, identified by appendiceal orifice and                            ileocecal valve. The colonoscopy was performed with                            difficulty due to significant looping and a                            tortuous colon. Successful completion of the                            procedure was aided by straightening and shortening                            the scope to obtain bowel loop reduction. The                            patient tolerated the procedure well. The quality                            of the bowel preparation was fair. The ileocecal                            valve, appendiceal orifice, and rectum were  photographed. Scope In: 1:38:02 PM Scope Out: 1:58:11 PM Scope Withdrawal Time: 0 hours 15 minutes 4 seconds  Total Procedure Duration: 0 hours 20 minutes 9 seconds  Findings:      The perianal exam findings include non-thrombosed external hemorrhoids       and a skin tag.      A single medium-sized localized angiodysplastic lesion without bleeding       was found in the proximal ascending colon. Fulguration to ablate the       lesion by argon plasma was successful. Estimated blood loss was minimal.      Multiple small and large-mouthed diverticula were found in the entire       colon.      Internal hemorrhoids were found during retroflexion. The hemorrhoids       were large and Grade II (internal hemorrhoids that prolapse but reduce       spontaneously). Impression:               - Preparation of the colon was fair.                           - Non-thrombosed external hemorrhoids and  perianal                            skin tag found on perianal exam.                           - A single non-bleeding colonic angiodysplastic                            lesion. Treated with argon plasma coagulation (APC).                           - Diverticulosis in the entire examined colon.                           - Internal hemorrhoids.                           - No specimens collected. Recommendation:           - Soft diet.                           - Repeat colonoscopy in 4 months for screening                            purposes.                           - Continue present medications. Procedure Code(s):        --- Professional ---                           [phone], Colonoscopy, flexible; with ablation of                            tumor(s), polyp(s), or other lesion(s) (includes  pre- and post-dilation and guide wire passage, when                            performed) Diagnosis Code(s):        --- Professional ---                           D50.9, Iron deficiency anemia, unspecified                           K55.20, Angiodysplasia of colon without hemorrhage                           K64.1, Second degree hemorrhoids                           K64.4, Residual hemorrhoidal skin tags                           K57.30, Diverticulosis of large intestine without                            perforation or abscess without bleeding CPT copyright 2019 American [employer]. All rights reserved. The codes documented in this report are preliminary and upon coder review may  be revised to meet current compliance requirements. Shirley Friar, MD 07/12/2020 2:13:11 PM This report has been signed electronically. Number of Addenda: 0

## 2020-07-12 NOTE — Anesthesia Preprocedure Evaluation (Addendum)
Anesthesia Evaluation  Patient identified by MRN, date of birth, ID band Patient awake    Reviewed: Allergy & Precautions, NPO status , Patient's Chart, lab work & pertinent test results  Airway Mallampati: II  TM Distance: >3 FB     Dental   Pulmonary former smoker,    breath sounds clear to auscultation       Cardiovascular hypertension, + Past MI  + Valvular Problems/Murmurs  Rhythm:Regular Rate:Normal     Neuro/Psych    GI/Hepatic negative GI ROS, Neg liver ROS,   Endo/Other  diabetes  Renal/GU Renal disease     Musculoskeletal   Abdominal   Peds  Hematology  (+) anemia ,   Anesthesia Other Findings   Reproductive/Obstetrics                             Anesthesia Physical Anesthesia Plan  ASA: III  Anesthesia Plan: MAC   Post-op Pain Management:    Induction: Intravenous  PONV Risk Score and Plan: 2 and Dexamethasone, Ondansetron and Midazolam  Airway Management Planned: Nasal Cannula  Additional Equipment:   Intra-op Plan:   Post-operative Plan:   Informed Consent: I have reviewed the patients History and Physical, chart, labs and discussed the procedure including the risks, benefits and alternatives for the proposed anesthesia with the patient or authorized representative who has indicated his/her understanding and acceptance.     Dental advisory given  Plan Discussed with: CRNA and Anesthesiologist  Anesthesia Plan Comments:         Anesthesia Quick Evaluation

## 2020-07-12 NOTE — Brief Op Note (Signed)
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 Suspect her anemia is from AVMs. See endopro for details. If anemia recurs, then will need a capsule endoscopy as an outpt. Soft diet. Will need a repeat colonoscopy as an outpt later this year with better prep. Our office will arrange GI f/u. Will sign off. Call if questions.

## 2020-07-12 NOTE — Anesthesia Procedure Notes (Signed)
Procedure Name: MAC Date/Time: 07/12/2020 1:08 PM Performed by: Jenne Campus, CRNA Pre-anesthesia Checklist: Patient identified, Emergency Drugs available, Suction available and Patient being monitored Oxygen Delivery Method: Nasal cannula

## 2020-07-12 NOTE — Op Note (Signed)
[facility] Patient Name: Jasmin Miller Procedure Date : 07/12/2020 MRN: [id number] Attending MD: Shirley Friar , MD Date of Birth: 1941-05-28 CSN: [id number] Age: 79 Admit Type: Inpatient Procedure:                Upper GI endoscopy Indications:              Iron deficiency anemia, Active gastrointestinal                            bleeding Providers:                Shirley Friar, MD, Alison Murray, RN, Beryle Beams, Technician, Romie Minus, CRNA Referring MD:             hospital team Medicines:                Propofol per Anesthesia, Monitored Anesthesia Care Complications:            No immediate complications. Estimated Blood Loss:     Estimated blood loss was minimal. Procedure:                Pre-Anesthesia Assessment:                           - Prior to the procedure, a History and Physical                            was performed, and patient medications and                            allergies were reviewed. The patient's tolerance of                            previous anesthesia was also reviewed. The risks                            and benefits of the procedure and the sedation                            options and risks were discussed with the patient.                            All questions were answered, and informed consent                            was obtained. Prior Anticoagulants: The patient has                            taken no previous anticoagulant or antiplatelet                            agents. ASA Grade Assessment: III - A patient with  severe systemic disease. After reviewing the risks                            and benefits, the patient was deemed in                            satisfactory condition to undergo the procedure.                           After obtaining informed consent, the endoscope was                            passed under direct vision. Throughout  the                            procedure, the patient's blood pressure, pulse, and                            oxygen saturations were monitored continuously. The                            GIF-H190 ([id number] Olympus gastroscope was                            introduced through the mouth, and advanced to the                            second part of duodenum. The upper GI endoscopy was                            accomplished without difficulty. The patient                            tolerated the procedure well. Scope In: Scope Out: Findings:      The examined esophagus was normal.      The Z-line was regular and was found 40 cm from the incisors.      Segmental mild inflammation characterized by congestion (edema) and       erythema was found in the gastric antrum.      A single 7 mm mucosal papule (nodule) with no bleeding and no stigmata       of recent bleeding was found in the gastric antrum. Biopsies were taken       with a cold forceps for histology. Estimated blood loss was minimal.      The cardia and gastric fundus were normal on retroflexion.      A few small angiodysplastic lesions without bleeding were found in the       duodenal bulb and in the second portion of the duodenum. Fulguration to       ablate the lesion by argon plasma was successful. Estimated blood loss       was minimal.      The exam of the duodenum was otherwise normal. Impression:               - Normal esophagus.                           -  Z-line regular, 40 cm from the incisors.                           - Acute gastritis.                           - A single mucosal papule (nodule) found in the                            stomach. Biopsied.                           - A few non-bleeding angiodysplastic lesions in the                            duodenum. Treated with argon plasma coagulation                            (APC). Recommendation:           - Patient has a contact number available for                             emergencies. The signs and symptoms of potential                            delayed complications were discussed with the                            patient. Return to normal activities tomorrow.                            Written discharge instructions were provided to the                            patient.                           - Soft diet. Procedure Code(s):        --- Professional ---                           [phone], Esophagogastroduodenoscopy, flexible,                            transoral; with ablation of tumor(s), polyp(s), or                            other lesion(s) (includes pre- and post-dilation                            and guide wire passage, when performed) Diagnosis Code(s):        --- Professional ---                           D50.9, Iron deficiency anemia, unspecified  K31.819, Angiodysplasia of stomach and duodenum                            without bleeding                           K92.2, Gastrointestinal hemorrhage, unspecified                           K29.00, Acute gastritis without bleeding                           K31.89, Other diseases of stomach and duodenum CPT copyright 2019 American Medical Association. All rights reserved. The codes documented in this report are preliminary and upon coder review may  be revised to meet current compliance requirements. Shirley Friar, MD 07/12/2020 2:05:58 PM This report has been signed electronically. Number of Addenda: 0

## 2020-07-12 NOTE — Progress Notes (Addendum)
Discharge pt as ordered. Discharge summary packet provided to pt with instrtuctions, Pt verbalized understanding of instructions. All questions and concerns were fully answered. No complaints. Pt alert/oreinted in no  Apparent distress. voiced. Per pt her grandaughter will pick her up.

## 2020-07-12 NOTE — Progress Notes (Signed)
Received pt from S/P EGD/colonoscopy in no apparent distress. Situated to room. Menu/ pt's welcome pack given with instructions. Pt verbalized understanding of instructions. No complaints. Voiced.

## 2020-07-12 NOTE — Interval H&P Note (Signed)
History and Physical Interval Note:  07/12/2020 1:06 PM  Jasmin Miller  has presented today for surgery, with the diagnosis of iron-deficiency anemia.  The various methods of treatment have been discussed with the patient and family. After consideration of risks, benefits and other options for treatment, the patient has consented to  Procedure(s): ESOPHAGOGASTRODUODENOSCOPY (EGD) (N/A) COLONOSCOPY WITH PROPOFOL (N/A) as a surgical intervention.  The patient's history has been reviewed, patient examined, no change in status, stable for surgery.  I have reviewed the patient's chart and labs.  Questions were answered to the patient's satisfaction.     Shirley Friar

## 2020-07-13 ENCOUNTER — Encounter (HOSPITAL_COMMUNITY): Payer: Self-pay | Admitting: Gastroenterology

## 2020-07-16 LAB — SURGICAL PATHOLOGY

## 2021-04-17 ENCOUNTER — Encounter (HOSPITAL_COMMUNITY): Payer: Self-pay

## 2021-04-17 ENCOUNTER — Emergency Department (HOSPITAL_COMMUNITY): Payer: Medicare Other

## 2021-04-17 ENCOUNTER — Other Ambulatory Visit: Payer: Self-pay

## 2021-04-17 ENCOUNTER — Inpatient Hospital Stay (HOSPITAL_COMMUNITY)
Admission: EM | Admit: 2021-04-17 | Discharge: 2021-04-20 | DRG: 377 | Disposition: A | Discharge status: Home / Self Care | Payer: Medicare Other | Attending: Internal Medicine | Admitting: Internal Medicine

## 2021-04-17 DIAGNOSIS — E876 Hypokalemia: Secondary | ICD-10-CM | POA: Diagnosis not present

## 2021-04-17 DIAGNOSIS — K2901 Acute gastritis with bleeding: Secondary | ICD-10-CM | POA: Diagnosis not present

## 2021-04-17 DIAGNOSIS — Z66 Do not resuscitate: Secondary | ICD-10-CM | POA: Diagnosis present

## 2021-04-17 DIAGNOSIS — N182 Chronic kidney disease, stage 2 (mild): Secondary | ICD-10-CM | POA: Diagnosis present

## 2021-04-17 DIAGNOSIS — Z79899 Other long term (current) drug therapy: Secondary | ICD-10-CM

## 2021-04-17 DIAGNOSIS — I480 Paroxysmal atrial fibrillation: Secondary | ICD-10-CM | POA: Diagnosis not present

## 2021-04-17 DIAGNOSIS — Z7984 Long term (current) use of oral hypoglycemic drugs: Secondary | ICD-10-CM

## 2021-04-17 DIAGNOSIS — Z823 Family history of stroke: Secondary | ICD-10-CM

## 2021-04-17 DIAGNOSIS — I471 Supraventricular tachycardia: Secondary | ICD-10-CM | POA: Diagnosis present

## 2021-04-17 DIAGNOSIS — I5031 Acute diastolic (congestive) heart failure: Secondary | ICD-10-CM | POA: Diagnosis present

## 2021-04-17 DIAGNOSIS — D649 Anemia, unspecified: Secondary | ICD-10-CM

## 2021-04-17 DIAGNOSIS — I152 Hypertension secondary to endocrine disorders: Secondary | ICD-10-CM | POA: Diagnosis present

## 2021-04-17 DIAGNOSIS — N179 Acute kidney failure, unspecified: Secondary | ICD-10-CM | POA: Diagnosis present

## 2021-04-17 DIAGNOSIS — E1122 Type 2 diabetes mellitus with diabetic chronic kidney disease: Secondary | ICD-10-CM | POA: Diagnosis present

## 2021-04-17 DIAGNOSIS — D62 Acute posthemorrhagic anemia: Secondary | ICD-10-CM | POA: Diagnosis present

## 2021-04-17 DIAGNOSIS — E119 Type 2 diabetes mellitus without complications: Secondary | ICD-10-CM

## 2021-04-17 DIAGNOSIS — I255 Ischemic cardiomyopathy: Secondary | ICD-10-CM | POA: Diagnosis present

## 2021-04-17 DIAGNOSIS — E1169 Type 2 diabetes mellitus with other specified complication: Secondary | ICD-10-CM | POA: Diagnosis present

## 2021-04-17 DIAGNOSIS — E785 Hyperlipidemia, unspecified: Secondary | ICD-10-CM | POA: Diagnosis present

## 2021-04-17 DIAGNOSIS — Z20822 Contact with and (suspected) exposure to covid-19: Secondary | ICD-10-CM | POA: Diagnosis present

## 2021-04-17 DIAGNOSIS — Z7982 Long term (current) use of aspirin: Secondary | ICD-10-CM

## 2021-04-17 DIAGNOSIS — Z87891 Personal history of nicotine dependence: Secondary | ICD-10-CM

## 2021-04-17 DIAGNOSIS — I2510 Atherosclerotic heart disease of native coronary artery without angina pectoris: Secondary | ICD-10-CM | POA: Diagnosis present

## 2021-04-17 DIAGNOSIS — E1159 Type 2 diabetes mellitus with other circulatory complications: Secondary | ICD-10-CM | POA: Diagnosis present

## 2021-04-17 DIAGNOSIS — I252 Old myocardial infarction: Secondary | ICD-10-CM

## 2021-04-17 DIAGNOSIS — I130 Hypertensive heart and chronic kidney disease with heart failure and stage 1 through stage 4 chronic kidney disease, or unspecified chronic kidney disease: Secondary | ICD-10-CM | POA: Diagnosis present

## 2021-04-17 DIAGNOSIS — I493 Ventricular premature depolarization: Secondary | ICD-10-CM | POA: Diagnosis present

## 2021-04-17 LAB — BASIC METABOLIC PANEL
Anion gap: 10 (ref 5–15)
BUN: 25 mg/dL — ABNORMAL HIGH (ref 8–23)
CO2: 20 mmol/L — ABNORMAL LOW (ref 22–32)
Calcium: 8.7 mg/dL — ABNORMAL LOW (ref 8.9–10.3)
Chloride: 103 mmol/L (ref 98–111)
Creatinine, Ser: 1.49 mg/dL — ABNORMAL HIGH (ref 0.44–1.00)
GFR, Estimated: 35 mL/min — ABNORMAL LOW (ref >60)
Glucose, Bld: 114 mg/dL — ABNORMAL HIGH (ref 70–99)
Potassium: 3.4 mmol/L — ABNORMAL LOW (ref 3.5–5.1)
Sodium: 133 mmol/L — ABNORMAL LOW (ref 135–145)

## 2021-04-17 LAB — CBG MONITORING, ED: Glucose-Capillary: 111 mg/dL — ABNORMAL HIGH (ref 70–99)

## 2021-04-17 LAB — CBC WITH DIFFERENTIAL/PLATELET
Abs Immature Granulocytes: 0.09 10*3/uL — ABNORMAL HIGH (ref 0.00–0.07)
Basophils Absolute: 0 10*3/uL (ref 0.0–0.1)
Basophils Relative: 0 %
Eosinophils Absolute: 0.1 10*3/uL (ref 0.0–0.5)
Eosinophils Relative: 2 %
HCT: 15.8 % — ABNORMAL LOW (ref 36.0–46.0)
Hemoglobin: 4.2 g/dL — CL (ref 12.0–15.0)
Immature Granulocytes: 1 %
Lymphocytes Relative: 17 %
Lymphs Abs: 1.2 10*3/uL (ref 0.7–4.0)
MCH: 17.1 pg — ABNORMAL LOW (ref 26.0–34.0)
MCHC: 26.6 g/dL — ABNORMAL LOW (ref 30.0–36.0)
MCV: 64.5 fL — ABNORMAL LOW (ref 80.0–100.0)
Monocytes Absolute: 1 10*3/uL (ref 0.1–1.0)
Monocytes Relative: 14 %
Neutro Abs: 4.5 10*3/uL (ref 1.7–7.7)
Neutrophils Relative %: 66 %
Platelets: 297 10*3/uL (ref 150–400)
RBC: 2.45 MIL/uL — ABNORMAL LOW (ref 3.87–5.11)
RDW: 19.2 % — ABNORMAL HIGH (ref 11.5–15.5)
WBC: 6.9 10*3/uL (ref 4.0–10.5)
nRBC: 0.7 % — ABNORMAL HIGH (ref 0.0–0.2)

## 2021-04-17 LAB — BRAIN NATRIURETIC PEPTIDE: B Natriuretic Peptide: 463 pg/mL — ABNORMAL HIGH (ref 0.0–100.0)

## 2021-04-17 LAB — TROPONIN I (HIGH SENSITIVITY): Troponin I (High Sensitivity): 59 ng/L — ABNORMAL HIGH (ref <18)

## 2021-04-17 MED ORDER — SODIUM CHLORIDE 0.9 % IV SOLN
10.0000 mL/h | Freq: Once | INTRAVENOUS | Status: AC
  Administered 2021-04-18: 10 mL/h via INTRAVENOUS

## 2021-04-17 NOTE — ED Provider Notes (Signed)
[facility]-EMERGENCY DEPT Provider Note   CSN: [id number] Arrival date & time: 04/17/21  1946     History Chief Complaint  Patient presents with  . Abnormal ECG    Jasmin Miller is a 80 y.o. female.  HPI 80 year old female presents today with complaints of new onset of A. fib.  Patient states she went to see her doctor at the urgent care where she goes for primary care due to some pain on the back of her hand.  She also has some peripheral edema.  While she was there it was noted that her heart rate was irregular.  They obtained an EKG that should showed new onset of A. fib and she was sent to the emergency department for further evaluation.  Patient denies any significant dyspnea, chest pain, or change in palpitations.  She states that she has had palpitations since she was in her 26s.  She is unable to identify any time when this started.  She is not on any anticoagulation.  Review of records from admission in August 2021 revealed that she was admitted with anemia and hemoglobin of 4.6.  At that time she was noted to have a B12 anemia.  Fecal occult blood test was negative.  At that time she has had EGD by Dr. Bosie Clos Colonoscopy 07/12/2020 single medium-sized localized angiodysplastic lesion without bleeding was found in the proximal ascending colon. Fulguration to ablate the lesion by argon plasma was successful. Estimated blood loss was minimal. The examined esophagus was normal. Findings: The Z-line was regular and was found 40 cm from the incisors. Segmental mild inflammation characterized by congestion (edema) and erythema was found in the gastric antrum. A single 7 mm mucosal papule (nodule) with no bleeding and no stigmata of recent bleeding was found in the gastric antrum. Biopsies were taken with a cold forceps for histology. Estimated blood loss was minimal. The cardia and gastric fundus were normal on retroflexion. A few small angiodysplastic lesions  without bleeding were found in the duodenal bulb and in the second portion of the duodenum. Fulguration to ablate the lesion by argon plasma was successful Past Medical History:  Diagnosis Date  . DM2 (diabetes mellitus, type 2) (HCC)   . Hypertension   . MI (myocardial infarction) (HCC)   . Symptomatic anemia    11/2018    Patient Active Problem List   Diagnosis Date Noted  . AVM (arteriovenous malformation) of duodenum, 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  . 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  . Acute gastritis without bleeding 07/12/2020  . Acute blood loss anemia 07/11/2020  . Microcytic anemia 07/10/2020  . AKI (acute kidney injury) (HCC) 07/10/2020  . Hyperlipidemia 02/14/2019  . Murmur 02/14/2019  . Exertional dyspnea 02/14/2019  . Screening cholesterol level 02/14/2019  . Macrocytic anemia 11/17/2018  . Symptomatic anemia 11/17/2018  . Unintentional weight loss 11/17/2018  . HTN (hypertension) 11/17/2018  . DM2 (diabetes mellitus, type 2) (HCC) 11/17/2018    Past Surgical History:  Procedure Laterality Date  . ABDOMINAL HYSTERECTOMY    . BIOPSY  07/12/2020   Procedure: BIOPSY;  Surgeon: Charlott Rakes, MD;  Location: [facility] ENDOSCOPY;  Service: Endoscopy;;  . COLONOSCOPY  07/12/2020  . COLONOSCOPY WITH PROPOFOL N/A 07/12/2020   Procedure: COLONOSCOPY WITH PROPOFOL;  Surgeon: Charlott Rakes, MD;  Location: [facility] ENDOSCOPY;  Service: Endoscopy;  Laterality: N/A;  . ESOPHAGOGASTRODUODENOSCOPY  07/12/2020  . ESOPHAGOGASTRODUODENOSCOPY N/A 07/12/2020   Procedure: ESOPHAGOGASTRODUODENOSCOPY (EGD);  Surgeon: Charlott Rakes, MD;  Location: [facility] ENDOSCOPY;  Service: Endoscopy;  Laterality: N/A;  . HOT HEMOSTASIS N/A 07/12/2020   Procedure: HOT HEMOSTASIS (ARGON PLASMA COAGULATION/BICAP);  Surgeon: Charlott Rakes, MD;  Location: [facility] ENDOSCOPY;  Service: Endoscopy;  Laterality: N/A;  EGD and COLON     OB History   No obstetric history on file.     Family History  Problem  Relation Age of Onset  . Anemia Mother   . Stroke Mother   . Anemia Daughter     Social History   Tobacco Use  . Smoking status: Former Smoker    Packs/day: 1.00    Years: 29.00    Pack years: 29.00    Types: Cigarettes    Quit date: 1993    Years since quitting: 29.3  . Smokeless tobacco: Never Used  . Tobacco comment: SMOKED FOR 29 YEARS   Vaping Use  . Vaping Use: Never used  Substance Use Topics  . Alcohol use: No  . Drug use: No    Home Medications Prior to Admission medications   Medication Sig Start Date End Date Taking? Authorizing Provider  amLODipine (NORVASC) 5 MG tablet Take 5 mg by mouth daily.    [provider]  aspirin EC 81 MG tablet Take 81 mg by mouth daily.    [provider]  Cholecalciferol (VITAMIN D3 PO) Take 1 capsule by mouth daily.    [provider]  lisinopril-hydrochlorothiazide (PRINZIDE,ZESTORETIC) 20-25 MG tablet Take 1 tablet by mouth daily.    [provider]  metFORMIN (GLUCOPHAGE-XR) 500 MG 24 hr tablet Take 500 mg by mouth daily with breakfast.    [provider]  metoprolol succinate (TOPROL-XL) 25 MG 24 hr tablet Take 25 mg by mouth daily. 07/03/20   [provider]  omeprazole (PRILOSEC) 40 MG capsule Take 1 capsule (40 mg total) by mouth in the morning and at bedtime. 07/12/20 08/11/20  Hughie Closs, MD  rosuvastatin (CRESTOR) 10 MG tablet Take 10 mg by mouth daily. 07/01/20   [provider]  triamcinolone cream (KENALOG) 0.1 % Apply 1 application topically every 8 (eight) hours as needed (itching).     [provider]  vitamin B-12 (CYANOCOBALAMIN) 1000 MCG tablet Take 1,000 mcg by mouth once a week. On Mondays    [provider]    Allergies    Patient has no known allergies.  Review of Systems   Review of Systems  All other systems reviewed and are negative.   Physical Exam Updated Vital Signs BP (!) 116/58   Pulse 84   Temp 98.3 F (36.8 C)  (Oral)   Resp 20   Ht 1.549 m (5\' 1" )   Wt 73.5 kg   SpO2 100%   BMI 30.61 kg/m   Physical Exam Vitals and nursing note reviewed.  Constitutional:      Appearance: Normal appearance. She is obese.  HENT:     Head: Normocephalic.     Right Ear: External ear normal.     Left Ear: External ear normal.     Nose: Nose normal.     Mouth/Throat:     Pharynx: Oropharynx is clear.  Eyes:     Extraocular Movements: Extraocular movements intact.     Pupils: Pupils are equal, round, and reactive to light.  Cardiovascular:     Rate and Rhythm: Normal rate. Rhythm irregular.     Pulses: Normal pulses.     Heart sounds: Normal heart sounds.  Pulmonary:     Effort: Pulmonary effort is normal.  Abdominal:     General: Bowel sounds are normal. There is no distension.     Palpations: Abdomen is soft.     Tenderness: There is no abdominal tenderness.  Musculoskeletal:        General: Normal range of motion.     Cervical back: Normal range of motion.     Right lower leg: Edema present.     Left lower leg: Edema present.  Skin:    General: Skin is warm.     Capillary Refill: Capillary refill takes less than 2 seconds.  Neurological:     General: No focal deficit present.     Mental Status: She is alert.  Psychiatric:        Mood and Affect: Mood normal.     ED Results / Procedures / Treatments   Labs (all labs ordered are listed, but only abnormal results are displayed) Labs Reviewed  CBG MONITORING, ED - Abnormal; Notable for the following components:      Result Value   Glucose-Capillary 111 (*)    All other components within normal limits  CBC WITH DIFFERENTIAL/PLATELET  BASIC METABOLIC PANEL  BRAIN NATRIURETIC PEPTIDE  TROPONIN I (HIGH SENSITIVITY)  TROPONIN I (HIGH SENSITIVITY)    EKG EKG Interpretation  Date/Time:  Wednesday Apr 17 2021 20:28:11 EDT Ventricular Rate:  99 PR Interval:  80 QRS Duration: 94 QT Interval:  342 QTC Calculation: 439 R Axis:   7 Text  Interpretation: Sinus or ectopic atrial rhythm Nonspecific T abnormalities, lateral leads Confirmed by Margarita Grizzle [phone]) on 04/17/2021 10:44:17 PM   Radiology DG Chest 2 View  Result Date: 04/17/2021 CLINICAL DATA:  Shortness of breath EXAM: CHEST - 2 VIEW COMPARISON:  07/10/2020 FINDINGS: No consolidation or edema. No pleural effusion or pneumothorax. Cardiomediastinal contours are within normal limits. No acute osseous abnormality. IMPRESSION: No acute process in the chest. Electronically Signed   By: Guadlupe Spanish M.D.   On: 04/17/2021 20:46    Procedures Procedures   Medications Ordered in ED Medications - No data to display  ED Course  I have reviewed the triage vital signs and the nursing notes.  Pertinent labs & imaging results that were available during my care of the patient were reviewed by me and considered in my medical decision making (see chart for details).    MDM Rules/Calculators/A&P                         Signed,  Margarita Grizzle, MD    04/17/2021 10:45 PM      EKG from urgent care reviewed and does appear to be in A. fib.  EKG here looks like she is in a regular rhythm again.  Chest x-Cristin Szatkowski is clear.  Labs are pending.  She is on rate control medications. All labs to check on results and heme critical hemoglobin of 4.2 reported. Will transfuse and consult for admission Discussed with Dr. Julian Reil who will see for admission Final Clinical Impression(s) / ED Diagnoses Final diagnoses:  Paroxysmal atrial fibrillation (HCC)  Anemia, unspecified type    Rx / DC Orders ED Discharge Orders    None       Margarita Grizzle, MD 04/17/21 [phone]

## 2021-04-17 NOTE — ED Provider Notes (Signed)
Emergency Medicine Provider Triage Evaluation Note  Jasmin Miller , a 80 y.o. female  was evaluated in triage.  Pt complains of sob that started last night. Went to pcp today and was noted to have an irregular rhythm. Denies chest pain. Reports ble swelling that started yesterday.   ekg from urgent care shows afib with  Review of Systems  Positive: sob Negative: Chest pain  Physical Exam  BP (!) 136/59 (BP Location: Left Arm)   Pulse (!) 106   Temp 98.3 F (36.8 C) (Oral)   Resp 18   Wt 73.5 kg   SpO2 100%   BMI 30.61 kg/m  Gen:   Awake, no distress   Resp:  Normal effort  MSK:   Moves extremities without difficulty  Other:  tachycardic  Medical Decision Making  Medically screening exam initiated at 8:23 PM.  Appropriate orders placed.  Andria Head was informed that the remainder of the evaluation will be completed by another provider, this initial triage assessment does not replace that evaluation, and the importance of remaining in the ED until their evaluation is complete.   Rayne Du 04/17/21 2023    Gerhard Munch, MD 04/18/21 2242

## 2021-04-17 NOTE — ED Notes (Signed)
This Clinical research associate attempted labs/piv insert without success.

## 2021-04-17 NOTE — ED Notes (Signed)
This writter attempted labs. Unsuccessful x1!

## 2021-04-17 NOTE — Progress Notes (Incomplete)
Called to attempt I.V. start. Assessed Pt. In area requested by her. Unable to find a vein.Pt would rather have M.D. attempt

## 2021-04-17 NOTE — ED Triage Notes (Addendum)
Pt reports being sent to ER from UC. Pt went to UC to be seen for arthritis pain in left hand, foot swelling and shortness of breath beginning yesterday. Pt reports they did an EKG and sent pt here for evaluation. Also sts not taking her metformin for 3 days.

## 2021-04-18 DIAGNOSIS — D649 Anemia, unspecified: Secondary | ICD-10-CM

## 2021-04-18 DIAGNOSIS — I255 Ischemic cardiomyopathy: Secondary | ICD-10-CM | POA: Diagnosis present

## 2021-04-18 DIAGNOSIS — I480 Paroxysmal atrial fibrillation: Secondary | ICD-10-CM | POA: Diagnosis present

## 2021-04-18 DIAGNOSIS — I152 Hypertension secondary to endocrine disorders: Secondary | ICD-10-CM | POA: Diagnosis present

## 2021-04-18 DIAGNOSIS — E785 Hyperlipidemia, unspecified: Secondary | ICD-10-CM | POA: Diagnosis present

## 2021-04-18 DIAGNOSIS — I471 Supraventricular tachycardia: Secondary | ICD-10-CM | POA: Diagnosis present

## 2021-04-18 DIAGNOSIS — I493 Ventricular premature depolarization: Secondary | ICD-10-CM | POA: Diagnosis present

## 2021-04-18 DIAGNOSIS — I130 Hypertensive heart and chronic kidney disease with heart failure and stage 1 through stage 4 chronic kidney disease, or unspecified chronic kidney disease: Secondary | ICD-10-CM | POA: Diagnosis present

## 2021-04-18 DIAGNOSIS — I2510 Atherosclerotic heart disease of native coronary artery without angina pectoris: Secondary | ICD-10-CM | POA: Diagnosis present

## 2021-04-18 DIAGNOSIS — N179 Acute kidney failure, unspecified: Secondary | ICD-10-CM | POA: Diagnosis present

## 2021-04-18 DIAGNOSIS — E1169 Type 2 diabetes mellitus with other specified complication: Secondary | ICD-10-CM | POA: Diagnosis present

## 2021-04-18 DIAGNOSIS — Z20822 Contact with and (suspected) exposure to covid-19: Secondary | ICD-10-CM | POA: Diagnosis present

## 2021-04-18 DIAGNOSIS — R609 Edema, unspecified: Secondary | ICD-10-CM | POA: Diagnosis not present

## 2021-04-18 DIAGNOSIS — Z87891 Personal history of nicotine dependence: Secondary | ICD-10-CM | POA: Diagnosis not present

## 2021-04-18 DIAGNOSIS — Z66 Do not resuscitate: Secondary | ICD-10-CM | POA: Diagnosis present

## 2021-04-18 DIAGNOSIS — N182 Chronic kidney disease, stage 2 (mild): Secondary | ICD-10-CM | POA: Diagnosis present

## 2021-04-18 DIAGNOSIS — I5031 Acute diastolic (congestive) heart failure: Secondary | ICD-10-CM | POA: Diagnosis present

## 2021-04-18 DIAGNOSIS — Z7984 Long term (current) use of oral hypoglycemic drugs: Secondary | ICD-10-CM | POA: Diagnosis not present

## 2021-04-18 DIAGNOSIS — E876 Hypokalemia: Secondary | ICD-10-CM | POA: Diagnosis not present

## 2021-04-18 DIAGNOSIS — I252 Old myocardial infarction: Secondary | ICD-10-CM | POA: Diagnosis not present

## 2021-04-18 DIAGNOSIS — E119 Type 2 diabetes mellitus without complications: Secondary | ICD-10-CM | POA: Diagnosis not present

## 2021-04-18 DIAGNOSIS — Z823 Family history of stroke: Secondary | ICD-10-CM | POA: Diagnosis not present

## 2021-04-18 DIAGNOSIS — Z7982 Long term (current) use of aspirin: Secondary | ICD-10-CM | POA: Diagnosis not present

## 2021-04-18 DIAGNOSIS — E1122 Type 2 diabetes mellitus with diabetic chronic kidney disease: Secondary | ICD-10-CM | POA: Diagnosis present

## 2021-04-18 DIAGNOSIS — Z79899 Other long term (current) drug therapy: Secondary | ICD-10-CM | POA: Diagnosis not present

## 2021-04-18 DIAGNOSIS — D62 Acute posthemorrhagic anemia: Secondary | ICD-10-CM | POA: Diagnosis present

## 2021-04-18 DIAGNOSIS — K2901 Acute gastritis with bleeding: Secondary | ICD-10-CM | POA: Diagnosis present

## 2021-04-18 DIAGNOSIS — R600 Localized edema: Secondary | ICD-10-CM | POA: Diagnosis not present

## 2021-04-18 LAB — RESP PANEL BY RT-PCR (FLU A&B, COVID) ARPGX2
Influenza A by PCR: NEGATIVE
Influenza B by PCR: NEGATIVE
SARS Coronavirus 2 by RT PCR: NEGATIVE

## 2021-04-18 LAB — BASIC METABOLIC PANEL
Anion gap: 7 (ref 5–15)
BUN: 24 mg/dL — ABNORMAL HIGH (ref 8–23)
CO2: 25 mmol/L (ref 22–32)
Calcium: 8.8 mg/dL — ABNORMAL LOW (ref 8.9–10.3)
Chloride: 105 mmol/L (ref 98–111)
Creatinine, Ser: 1.41 mg/dL — ABNORMAL HIGH (ref 0.44–1.00)
GFR, Estimated: 38 mL/min — ABNORMAL LOW (ref >60)
Glucose, Bld: 127 mg/dL — ABNORMAL HIGH (ref 70–99)
Potassium: 3.6 mmol/L (ref 3.5–5.1)
Sodium: 137 mmol/L (ref 135–145)

## 2021-04-18 LAB — URINALYSIS, ROUTINE W REFLEX MICROSCOPIC
Bilirubin Urine: NEGATIVE
Glucose, UA: NEGATIVE mg/dL
Hgb urine dipstick: NEGATIVE
Ketones, ur: NEGATIVE mg/dL
Nitrite: NEGATIVE
Protein, ur: NEGATIVE mg/dL
Specific Gravity, Urine: 1.011 (ref 1.005–1.030)
pH: 5 (ref 5.0–8.0)

## 2021-04-18 LAB — HEMOGLOBIN AND HEMATOCRIT, BLOOD
HCT: 22.4 % — ABNORMAL LOW (ref 36.0–46.0)
HCT: 23.1 % — ABNORMAL LOW (ref 36.0–46.0)
HCT: 22.9 % — ABNORMAL LOW (ref 36.0–46.0)
Hemoglobin: 6.8 g/dL — CL (ref 12.0–15.0)
Hemoglobin: 7.1 g/dL — ABNORMAL LOW (ref 12.0–15.0)
Hemoglobin: 7.1 g/dL — ABNORMAL LOW (ref 12.0–15.0)

## 2021-04-18 LAB — CBG MONITORING, ED
Glucose-Capillary: 114 mg/dL — ABNORMAL HIGH (ref 70–99)
Glucose-Capillary: 124 mg/dL — ABNORMAL HIGH (ref 70–99)
Glucose-Capillary: 122 mg/dL — ABNORMAL HIGH (ref 70–99)

## 2021-04-18 LAB — PREPARE RBC (CROSSMATCH)

## 2021-04-18 LAB — CBC
HCT: 15.4 % — ABNORMAL LOW (ref 36.0–46.0)
Hemoglobin: 4.2 g/dL — CL (ref 12.0–15.0)
MCH: 17.6 pg — ABNORMAL LOW (ref 26.0–34.0)
MCHC: 27.3 g/dL — ABNORMAL LOW (ref 30.0–36.0)
MCV: 64.4 fL — ABNORMAL LOW (ref 80.0–100.0)
Platelets: 280 10*3/uL (ref 150–400)
RBC: 2.39 MIL/uL — ABNORMAL LOW (ref 3.87–5.11)
RDW: 20.6 % — ABNORMAL HIGH (ref 11.5–15.5)
WBC: 5.6 10*3/uL (ref 4.0–10.5)
nRBC: 0.7 % — ABNORMAL HIGH (ref 0.0–0.2)

## 2021-04-18 LAB — GLUCOSE, CAPILLARY
Glucose-Capillary: 103 mg/dL — ABNORMAL HIGH (ref 70–99)
Glucose-Capillary: 133 mg/dL — ABNORMAL HIGH (ref 70–99)

## 2021-04-18 MED ORDER — PANTOPRAZOLE SODIUM 40 MG IV SOLR
40.0000 mg | Freq: Two times a day (BID) | INTRAVENOUS | Status: DC
  Administered 2021-04-18 (×2): 40 mg via INTRAVENOUS
  Filled 2021-04-18 (×2): qty 40

## 2021-04-18 MED ORDER — INSULIN ASPART 100 UNIT/ML IJ SOLN
0.0000 [IU] | INTRAMUSCULAR | Status: DC
  Administered 2021-04-18 (×2): 1 [IU] via SUBCUTANEOUS
  Administered 2021-04-20: 2 [IU] via SUBCUTANEOUS
  Filled 2021-04-18: qty 0.09

## 2021-04-18 MED ORDER — METOPROLOL SUCCINATE ER 25 MG PO TB24
25.0000 mg | ORAL_TABLET | Freq: Every day | ORAL | Status: DC
  Administered 2021-04-18 (×2): 25 mg via ORAL
  Filled 2021-04-18 (×2): qty 1

## 2021-04-18 MED ORDER — SODIUM CHLORIDE 0.9% FLUSH
3.0000 mL | Freq: Two times a day (BID) | INTRAVENOUS | Status: DC
  Administered 2021-04-18 – 2021-04-20 (×6): 3 mL via INTRAVENOUS

## 2021-04-18 MED ORDER — ROSUVASTATIN CALCIUM 10 MG PO TABS
10.0000 mg | ORAL_TABLET | Freq: Every day | ORAL | Status: DC
  Administered 2021-04-18 – 2021-04-20 (×2): 10 mg via ORAL
  Filled 2021-04-18 (×2): qty 1

## 2021-04-18 MED ORDER — ACETAMINOPHEN 650 MG RE SUPP: 650.0000 mg | Freq: Four times a day (QID) | RECTAL | Status: DC | PRN

## 2021-04-18 MED ORDER — ONDANSETRON HCL 4 MG/2ML IJ SOLN: 4.0000 mg | Freq: Four times a day (QID) | INTRAMUSCULAR | Status: DC | PRN

## 2021-04-18 MED ORDER — ACETAMINOPHEN 325 MG PO TABS
650.0000 mg | ORAL_TABLET | Freq: Four times a day (QID) | ORAL | Status: DC | PRN
  Administered 2021-04-18 – 2021-04-19 (×3): 650 mg via ORAL
  Filled 2021-04-18 (×3): qty 2

## 2021-04-18 MED ORDER — ONDANSETRON HCL 4 MG PO TABS: 4.0000 mg | ORAL_TABLET | Freq: Four times a day (QID) | ORAL | Status: DC | PRN

## 2021-04-18 MED ORDER — POTASSIUM CHLORIDE 20 MEQ PO PACK
40.0000 meq | PACK | Freq: Once | ORAL | Status: AC
  Administered 2021-04-18: 40 meq via ORAL
  Filled 2021-04-18: qty 2

## 2021-04-18 NOTE — ED Notes (Signed)
Spoke to blood bank: blood has approximately another 20 minutes before it is ready.

## 2021-04-18 NOTE — ED Notes (Signed)
Unable to obtain blood for am labs.  Pt difficult stick, will ask phlebotomy or second RN/NT.

## 2021-04-18 NOTE — H&P (Signed)
History and Physical    Jasmin Miller SLH:[id number] DOB: [birth year] DOA: 04/17/2021  PCP: Lindaann Pascal, PA-C  Patient coming from: Home  I have personally briefly reviewed patient's old medical records in [facility]  Chief Complaint: Shortness of breath  HPI: Jasmin Miller is a 80 y.o. 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  Patient states she initially went to see her PCP on 5/11 for evaluation of exertional dyspnea and right hand pain.  She says she was noted to have an irregular heartbeat and was told that she had atrial fibrillation and subsequently recommended to come to the ED for further evaluation.  Patient states that she has new onset of exertional dyspnea over the last few days.  She has noticed some increased swelling in both of her lower extremities which she says occurs intermittently.  She has had a nonproductive cough.  She has not had any chest pain, nausea, vomiting, abdominal pain.  She has not seen any rectal bleeding or dark black appearing stool.  She was last admitted in August 2021 for symptomatic anemia.  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  AVMs were considered the cause of her anemia due to slow chronic bleed.  Follow-up colonoscopy was recommended but does not appear to have been completed.  Patient states she is taking aspirin 81 mg daily.  ED Course:  Initial vitals showed BP 136/59, pulse 106, RR 18, temp 98.3 F, SPO2 100% on room air.  Labs show hemoglobin 4.2, hematocrit 15.8, MCV 64.5, RDW 19.2, platelets 297,000, WBC 6.9, sodium 133, potassium 3.4, bicarb 20, BUN 25, creatinine 1.49, serum glucose 114, high-sensitivity troponin I 59, BNP 463.0.  2 view chest x-ray  is negative for focal consolidation, edema, or effusion.  Patient was ordered to receive 2 unit PRBC transfusion.  The hospitalist service was consulted to admit for further evaluation and management.  Review of Systems: All systems reviewed and are negative except as documented in history of present illness above.   Past Medical History:  Diagnosis Date  . DM2 (diabetes mellitus, type 2) (HCC)   . Hypertension   . MI (myocardial infarction) (HCC)   . Symptomatic anemia    11/2018    Past Surgical History:  Procedure Laterality Date  . ABDOMINAL HYSTERECTOMY    . BIOPSY  07/12/2020   Procedure: BIOPSY;  Surgeon: Charlott Rakes, MD;  Location: [facility] ENDOSCOPY;  Service: Endoscopy;;  . COLONOSCOPY  07/12/2020  . COLONOSCOPY WITH PROPOFOL N/A 07/12/2020   Procedure: COLONOSCOPY WITH PROPOFOL;  Surgeon: Charlott Rakes, MD;  Location: [facility] ENDOSCOPY;  Service: Endoscopy;  Laterality: N/A;  . ESOPHAGOGASTRODUODENOSCOPY  07/12/2020  . ESOPHAGOGASTRODUODENOSCOPY N/A 07/12/2020   Procedure: ESOPHAGOGASTRODUODENOSCOPY (EGD);  Surgeon: Charlott Rakes, MD;  Location: [facility] ENDOSCOPY;  Service: Endoscopy;  Laterality: N/A;  . HOT HEMOSTASIS N/A 07/12/2020   Procedure: HOT HEMOSTASIS (ARGON PLASMA COAGULATION/BICAP);  Surgeon: Charlott Rakes, MD;  Location: [facility] ENDOSCOPY;  Service: Endoscopy;  Laterality: N/A;  EGD and COLON    Social History:  reports that she quit smoking about 29 years ago. Her smoking use included cigarettes. She has a 29.00 pack-year smoking history. She has never used smokeless tobacco. She reports that she does not drink alcohol and does not use drugs.  No Known Allergies  Family History  Problem Relation Age of Onset  . Anemia Mother   . Stroke Mother   . Anemia Daughter      Prior to Admission medications   Medication Sig Start Date End Date Taking? Authorizing Provider  amLODipine (NORVASC) 5 MG tablet Take 5 mg by mouth daily.    [provider]   aspirin EC 81 MG tablet Take 81 mg by mouth daily.    [provider]  Cholecalciferol (VITAMIN D3 PO) Take 1 capsule by mouth daily.    [provider]  lisinopril-hydrochlorothiazide (PRINZIDE,ZESTORETIC) 20-25 MG tablet Take 1 tablet by mouth daily.    [provider]  metFORMIN (GLUCOPHAGE-XR) 500 MG 24 hr tablet Take 500 mg by mouth daily with breakfast.    [provider]  metoprolol succinate (TOPROL-XL) 25 MG 24 hr tablet Take 25 mg by mouth daily. 07/03/20   [provider]  omeprazole (PRILOSEC) 40 MG capsule Take 1 capsule (40 mg total) by mouth in the morning and at bedtime. 07/12/20 08/11/20  Hughie ClossPahwani, Ravi, MD  rosuvastatin (CRESTOR) 10 MG tablet Take 10 mg by mouth daily. 07/01/20   [provider]  triamcinolone cream (KENALOG) 0.1 % Apply 1 application topically every 8 (eight) hours as needed (itching).     [provider]  vitamin B-12 (CYANOCOBALAMIN) 1000 MCG tablet     [provider]    Physical Exam: Vitals:   04/17/21 2230 04/17/21 2300 04/17/21 2336 04/17/21 2350  BP: 122/76 (!) 123/54  (!) 124/55  Pulse: (!) 103 (!) 107 100 99  Resp: 19 19 17 18   Temp:      TempSrc:      SpO2: 99% 99% 100% 100%  Weight:      Height:       Constitutional: Resting in bed, NAD, calm, comfortable Eyes: PERRL, lids and conjunctivae normal ENMT: Mucous membranes are moist. Posterior pharynx clear of any exudate or lesions.Normal dentition.  Neck: normal, supple, no masses. Respiratory: clear to auscultation bilaterally, no wheezing, no crackles. Normal respiratory effort. No accessory muscle use.  Cardiovascular: Regular rate and rhythm with occasional irregular beat, no murmurs / rubs / gallops.  +1 bilateral lower extremity edema. 2+ pedal pulses. Abdomen: no tenderness, no masses palpated. No hepatosplenomegaly. Bowel sounds positive.  Musculoskeletal: no clubbing / cyanosis. No joint deformity upper and lower  extremities. Good ROM, no contractures. Normal muscle tone.  Skin: no rashes, lesions, ulcers. No induration Neurologic: CN 2-12 grossly intact. Sensation intact. Strength 5/5 in all 4.  Psychiatric: Normal judgment and insight. Alert and oriented x 3. Normal mood.    Labs on Admission: I have personally reviewed following labs and imaging studies  CBC: Recent Labs  Lab 04/17/21 2210  WBC 6.9  NEUTROABS 4.5  HGB 4.2*  HCT 15.8*  MCV 64.5*  PLT 297   Basic Metabolic Panel: Recent Labs  Lab 04/17/21 2210  NA 133*  K 3.4*  CL 103  CO2 20*  GLUCOSE 114*  BUN 25*  CREATININE 1.49*  CALCIUM 8.7*   GFR: Estimated Creatinine Clearance: 27.6 mL/min (A) (by C-G formula based on SCr of 1.49 mg/dL (H)). Liver Function Tests: No results for input(s): AST, ALT, ALKPHOS, BILITOT, PROT, ALBUMIN in the last 168 hours. No results for input(s): LIPASE, AMYLASE in the last 168 hours. No results for input(s): AMMONIA in the last 168 hours. Coagulation Profile: No results for input(s): INR, PROTIME in the last 168 hours. Cardiac  Enzymes: No results for input(s): CKTOTAL, CKMB, CKMBINDEX, TROPONINI in the last 168 hours. BNP (last 3 results) No results for input(s): PROBNP in the last 8760 hours. HbA1C: No results for input(s): HGBA1C in the last 72 hours. CBG: Recent Labs  Lab 04/17/21 2015  GLUCAP 111*   Lipid Profile: No results for input(s): CHOL, HDL, LDLCALC, TRIG, CHOLHDL, LDLDIRECT in the last 72 hours. Thyroid Function Tests: No results for input(s): TSH, T4TOTAL, FREET4, T3FREE, THYROIDAB in the last 72 hours. Anemia Panel: No results for input(s): VITAMINB12, FOLATE, FERRITIN, TIBC, IRON, RETICCTPCT in the last 72 hours. Urine analysis:    Component Value Date/Time   COLORURINE STRAW (A) 11/17/2018 0126   APPEARANCEUR CLEAR 11/17/2018 0126   LABSPEC 1.010 11/17/2018 0126   PHURINE 6.0 11/17/2018 0126   GLUCOSEU NEGATIVE 11/17/2018 0126   HGBUR SMALL (A)  11/17/2018 0126   BILIRUBINUR NEGATIVE 11/17/2018 0126   KETONESUR NEGATIVE 11/17/2018 0126   PROTEINUR NEGATIVE 11/17/2018 0126   UROBILINOGEN 0.2 10/03/2015 1313   NITRITE NEGATIVE 11/17/2018 0126   LEUKOCYTESUR NEGATIVE 11/17/2018 0126    Radiological Exams on Admission: DG Chest 2 View  Result Date: 04/17/2021 CLINICAL DATA:  Shortness of breath EXAM: CHEST - 2 VIEW COMPARISON:  07/10/2020 FINDINGS: No consolidation or edema. No pleural effusion or pneumothorax. Cardiomediastinal contours are within normal limits. No acute osseous abnormality. IMPRESSION: No acute process in the chest. Electronically Signed   By: Guadlupe Spanish M.D.   On: 04/17/2021 20:46    EKG: Personally reviewed. ?  Sinus rhythm, EKG limited due to motion artifact.  Assessment/Plan Principal Problem:   Symptomatic anemia Active Problems:   Hypertension associated with diabetes (HCC)   DM2 (diabetes mellitus, type 2) (HCC)   AKI (acute kidney injury) (HCC)   Hyperlipidemia associated with type 2 diabetes mellitus (HCC)   Jasmin Miller is a 80 y.o. 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  Suspect ongoing slow GI blood loss from AVMs resulting in severe anemia and likely iron deficiency. -Transfusing 2 unit PRBCs -Check anemia panel -Keep n.p.o.  Paroxysmal atrial fibrillation: Reported new finding of A. fib with PCP, records not available.  Occasional PVCs noted on telemetry at time of admission.  Keep on telemetry to monitor for A. fib.  Continue home Toprol-XL for rate control.  Her CHA2DS2-VASc score is at least 5 however anticoagulation not started due to severe anemia.  Obtain echocardiogram.  Acute kidney  injury: Suspect related to severe anemia.  Continue with blood transfusion and follow renal function.  Type 2 diabetes: Hold home metformin.  Place on sensitive SSI q4h while NPO.  Hypertension: Resume Toprol-XL as above.  Holding home amlodipine and lisinopril-HCTZ for now.  Hyperlipidemia: Continue rosuvastatin.   DVT prophylaxis: SCDs Code Status: DNR, confirmed with patient on admission Family Communication: Discussed with patient, she has discussed with family Disposition Plan: From home and likely discharge to home pending clinical progress Consults called: None Level of care: Stepdown Admission status:   Status is: Inpatient  Remains inpatient appropriate because:Ongoing diagnostic testing needed not appropriate for outpatient work up, IV treatments appropriate due to intensity of illness or inability to take PO and Inpatient level of care appropriate due to severity of illness  Dispo: The patient is from: Home              Anticipated d/c is to: Home              Patient currently is not medically stable to d/c.   Difficult to place patient No  Darreld Mclean MD [facility]  If 7PM-7AM, please contact night-coverage [email or web address]  04/18/2021, 12:26 AM

## 2021-04-18 NOTE — ED Notes (Signed)
CBG 112.  

## 2021-04-18 NOTE — Progress Notes (Signed)
Patient is admitted after midnight, details please see HPI. She is seen and examined, currently no hypoxia at rest, denies pain, denies acute bleeding, reported came to the hospital due to feeling short of breath, bilateral low-grade lower extremity edema started from Saturday, report history of using Lasix several years ago. Will check venous Doppler and echocardiogram GI consulted, plan for endoscope tomorrow, n.p.o. after midnight.

## 2021-04-18 NOTE — ED Notes (Signed)
Pt provided apple juice, per her request.

## 2021-04-18 NOTE — H&P (View-Only) (Signed)
Referring Provider: Dr. Roda Shutters Primary Care Physician:  Lindaann Pascal, PA-C Primary Gastroenterologist:  Gentry Fitz  Reason for Consultation:  Symptomatic anemia  HPI: Jasmin Miller is a 80 y.o. female with history of DM type II, anemia, duodenal AVMs, and one colonic AVM presenting for consultation of symptomatic anemia.  Patient presented to the ED yesterday with shortness of breath and was found to be anemic with hemoglobin 4.2.  She has also felt weak and fatigued.  She states she saw urgent care and EKG revealed "irregular heartbeat," and she notes a history of palpitations.  She reports chronic constipation but otherwise denies any gastrointestinal complaints.  Denies diarrhea, melena, or hematochezia.  Further denies abdominal pain, nausea, vomiting, dysphagia, changes in appetite, or unexplained weight loss.  She takes 81 mg aspirin daily.  She takes Aleve as needed for pain, but states she does not take it on a regular basis.  Denies blood thinner use.  Denies family history of colon cancer or gastrointestinal malignancy.  She underwent EGD and colonoscopy in 07/2020 for anemia.  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 Negative for H pylori, intestinal metaplasia, dysplasia, and malignancy), and acute gastritis.  Colonoscopy revealed a single nonbleeding colonic AVM, treated with APC, external hemorrhoids, internal hemorrhoids, and diverticulosis.   Past Medical History:  Diagnosis Date  . DM2 (diabetes mellitus, type 2) (HCC)   . Hypertension   . MI (myocardial infarction) (HCC)   . Symptomatic anemia    11/2018    Past Surgical History:  Procedure Laterality Date  . ABDOMINAL HYSTERECTOMY    . BIOPSY  07/12/2020   Procedure: BIOPSY;  Surgeon: Charlott Rakes, MD;  Location: [facility] ENDOSCOPY;  Service: Endoscopy;;  . COLONOSCOPY  07/12/2020  . COLONOSCOPY WITH PROPOFOL N/A 07/12/2020   Procedure:  COLONOSCOPY WITH PROPOFOL;  Surgeon: Charlott Rakes, MD;  Location: [facility] ENDOSCOPY;  Service: Endoscopy;  Laterality: N/A;  . ESOPHAGOGASTRODUODENOSCOPY  07/12/2020  . ESOPHAGOGASTRODUODENOSCOPY N/A 07/12/2020   Procedure: ESOPHAGOGASTRODUODENOSCOPY (EGD);  Surgeon: Charlott Rakes, MD;  Location: [facility] ENDOSCOPY;  Service: Endoscopy;  Laterality: N/A;  . HOT HEMOSTASIS N/A 07/12/2020   Procedure: HOT HEMOSTASIS (ARGON PLASMA COAGULATION/BICAP);  Surgeon: Charlott Rakes, MD;  Location: [facility] ENDOSCOPY;  Service: Endoscopy;  Laterality: N/A;  EGD and COLON    Prior to Admission medications   Medication Sig Start Date End Date Taking? Authorizing Provider  amLODipine (NORVASC) 5 MG tablet Take 5 mg by mouth daily.   Yes [provider]  aspirin EC 81 MG tablet Take 81 mg by mouth daily.   Yes [provider]  lisinopril-hydrochlorothiazide (PRINZIDE,ZESTORETIC) 20-25 MG tablet Take 1 tablet by mouth daily.   Yes [provider]  metFORMIN (GLUCOPHAGE-XR) 500 MG 24 hr tablet Take 500 mg by mouth daily with breakfast.   Yes [provider]  metoprolol succinate (TOPROL-XL) 25 MG 24 hr tablet Take 25 mg by mouth daily. 07/03/20  Yes [provider]  rosuvastatin (CRESTOR) 10 MG tablet Take 10 mg by mouth daily. 07/01/20  Yes [provider]  triamcinolone cream (KENALOG) 0.1 % Apply 1 application topically every 8 (eight) hours as needed (itching).    Yes [provider]    Scheduled Meds: . insulin aspart  0-9 Units Subcutaneous Q4H  . metoprolol succinate  25 mg Oral QHS  . rosuvastatin  10 mg Oral Daily  . sodium chloride flush  3 mL Intravenous Q12H   Continuous  Infusions: PRN Meds:.acetaminophen **OR** acetaminophen, ondansetron **OR** ondansetron (ZOFRAN) IV  Allergies as of 04/17/2021  . (No Known Allergies)    Family History  Problem Relation Age of Onset  . Anemia Mother   . Stroke Mother   . Anemia Daughter      Social History   Socioeconomic History  . Marital status: Widowed    Spouse name: Not on file  . Number of children: 4  . Years of education: Not on file  . Highest education level: Not on file  Occupational History  . Not on file  Tobacco Use  . Smoking status: Former Smoker    Packs/day: 1.00    Years: 29.00    Pack years: 29.00    Types: Cigarettes    Quit date: 1993    Years since quitting: 29.3  . Smokeless tobacco: Never Used  . Tobacco comment: SMOKED FOR 29 YEARS   Vaping Use  . Vaping Use: Never used  Substance and Sexual Activity  . Alcohol use: No  . Drug use: No  . Sexual activity: Not on file  Other Topics Concern  . Not on file  Social History Narrative  . Not on file   Social Determinants of Health   Financial Resource Strain: Not on file  Food Insecurity: Not on file  Transportation Needs: Not on file  Physical Activity: Not on file  Stress: Not on file  Social Connections: Not on file  Intimate Partner Violence: Not on file    Review of Systems: Review of Systems  Constitutional: Positive for malaise/fatigue. Negative for chills, fever and weight loss.  HENT: Negative for hearing loss.   Eyes: Negative for pain and redness.  Respiratory: Positive for shortness of breath. Negative for cough.   Cardiovascular: Positive for palpitations. Negative for chest pain.  Gastrointestinal: Positive for constipation. Negative for abdominal pain, blood in stool, diarrhea, heartburn, melena, nausea and vomiting.  Genitourinary: Negative for flank pain and hematuria.  Musculoskeletal: Negative for falls and myalgias.  Skin: Negative for itching and rash.  Neurological: Negative for seizures and loss of consciousness.  Endo/Heme/Allergies: Negative for polydipsia. Does not bruise/bleed easily.  Psychiatric/Behavioral: Negative for substance abuse. The patient is not nervous/anxious.      Physical Exam: Vital signs: Vitals:   04/18/21 0900 04/18/21  0930  BP: 127/68 129/75  Pulse: 89 87  Resp: 17 17  Temp:    SpO2: 98% 99%      Physical Exam Vitals reviewed.  Constitutional:      General: She is not in acute distress. HENT:     Head: Normocephalic and atraumatic.     Nose: Nose normal. No congestion.     Mouth/Throat:     Mouth: Mucous membranes are moist.     Pharynx: Oropharynx is clear.  Eyes:     Extraocular Movements: Extraocular movements intact.     Comments: Conjunctival pallor  Cardiovascular:     Rate and Rhythm: Normal rate and regular rhythm.  Pulmonary:     Effort: Pulmonary effort is normal.     Breath sounds: Normal breath sounds.  Abdominal:     General: Bowel sounds are normal. There is no distension.     Palpations: Abdomen is soft. There is no mass.     Tenderness: There is no abdominal tenderness. There is no guarding or rebound.     Hernia: No hernia is present.  Musculoskeletal:     Cervical back: Normal range of motion and neck supple.  Right lower leg: Edema present.     Left lower leg: Edema present.  Skin:    General: Skin is warm and dry.  Neurological:     General: No focal deficit present.     Mental Status: She is oriented to person, place, and time. She is lethargic.  Psychiatric:        Mood and Affect: Mood normal.        Behavior: Behavior normal. Behavior is cooperative.     GI:  Lab Results: Recent Labs    04/17/21 2210 04/18/21 0605  WBC 6.9 5.6  HGB 4.2* 4.2*  HCT 15.8* 15.4*  PLT 297 280   BMET Recent Labs    04/17/21 2210 04/18/21 0605  NA 133* 137  K 3.4* 3.6  CL 103 105  CO2 20* 25  GLUCOSE 114* 127*  BUN 25* 24*  CREATININE 1.49* 1.41*  CALCIUM 8.7* 8.8*   LFT No results for input(s): PROT, ALBUMIN, AST, ALT, ALKPHOS, BILITOT, BILIDIR, IBILI in the last 72 hours. PT/INR No results for input(s): LABPROT, INR in the last 72 hours.   Studies/Results: DG Chest 2 View  Result Date: 04/17/2021 CLINICAL DATA:  Shortness of breath EXAM: CHEST -  2 VIEW COMPARISON:  07/10/2020 FINDINGS: No consolidation or edema. No pleural effusion or pneumothorax. Cardiomediastinal contours are within normal limits. No acute osseous abnormality. IMPRESSION: No acute process in the chest. Electronically Signed   By: Guadlupe Spanish M.D.   On: 04/17/2021 20:46    Impression: Symptomatic anemia: History of duodenal AVMs, as well as one colonic AVM.  No frank GI bleeding.  Presentation most consistent with chronic blood loss from AVMs. -Hemoglobin 4.2 on arrival, 4.2 after 1u pRBCs (inadequate rise).  Decreased from 7.5 as of 07/2020.  No more recent CBC available electronically. -BUN 24/creatinine 1.41  Recent EKG with A fib, patient currently in regular rhythm and has not been started on blood thinner  Plan: EGD tomorrow for further evaluation.  I thoroughly discussed the procedure with the patient to include nature, alternatives, benefits, and risks (including but not limited to bleeding, infection, perforation, anesthesia/cardiac and pulmonary complications).  Patient verbalized understanding and gave verbal consent to proceed with EGD.  Start Protonix 40 mg IV twice daily.  Clear liquid diet, NPO after midnight.  Continue monitor H&H with transfusion as needed to maintain hemoglobin greater than 7.  [facility] will follow.   LOS: 0 days   Edrick Kins  PA-C 04/18/2021, 10:48 AM  Contact #  [phone]

## 2021-04-18 NOTE — ED Notes (Signed)
Spoke with blood bank: blood will not be ready for another hour to an hour and a half.

## 2021-04-18 NOTE — Consult Note (Addendum)
Referring Provider: Dr. Roda Shutters Primary Care Physician:  Lindaann Pascal, PA-C Primary Gastroenterologist:  Gentry Fitz  Reason for Consultation:  Symptomatic anemia  HPI: Jasmin Miller is a 80 y.o. female with history of DM type II, anemia, duodenal AVMs, and one colonic AVM presenting for consultation of symptomatic anemia.  Patient presented to the ED yesterday with shortness of breath and was found to be anemic with hemoglobin 4.2.  She has also felt weak and fatigued.  She states she saw urgent care and EKG revealed "irregular heartbeat," and she notes a history of palpitations.  She reports chronic constipation but otherwise denies any gastrointestinal complaints.  Denies diarrhea, melena, or hematochezia.  Further denies abdominal pain, nausea, vomiting, dysphagia, changes in appetite, or unexplained weight loss.  She takes 81 mg aspirin daily.  She takes Aleve as needed for pain, but states she does not take it on a regular basis.  Denies blood thinner use.  Denies family history of colon cancer or gastrointestinal malignancy.  She underwent EGD and colonoscopy in 07/2020 for anemia.  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 Negative for H pylori, intestinal metaplasia, dysplasia, and malignancy), and acute gastritis.  Colonoscopy revealed a single nonbleeding colonic AVM, treated with APC, external hemorrhoids, internal hemorrhoids, and diverticulosis.   Past Medical History:  Diagnosis Date  . DM2 (diabetes mellitus, type 2) (HCC)   . Hypertension   . MI (myocardial infarction) (HCC)   . Symptomatic anemia    11/2018    Past Surgical History:  Procedure Laterality Date  . ABDOMINAL HYSTERECTOMY    . BIOPSY  07/12/2020   Procedure: BIOPSY;  Surgeon: Charlott Rakes, MD;  Location: [facility] ENDOSCOPY;  Service: Endoscopy;;  . COLONOSCOPY  07/12/2020  . COLONOSCOPY WITH PROPOFOL N/A 07/12/2020   Procedure:  COLONOSCOPY WITH PROPOFOL;  Surgeon: Charlott Rakes, MD;  Location: [facility] ENDOSCOPY;  Service: Endoscopy;  Laterality: N/A;  . ESOPHAGOGASTRODUODENOSCOPY  07/12/2020  . ESOPHAGOGASTRODUODENOSCOPY N/A 07/12/2020   Procedure: ESOPHAGOGASTRODUODENOSCOPY (EGD);  Surgeon: Charlott Rakes, MD;  Location: [facility] ENDOSCOPY;  Service: Endoscopy;  Laterality: N/A;  . HOT HEMOSTASIS N/A 07/12/2020   Procedure: HOT HEMOSTASIS (ARGON PLASMA COAGULATION/BICAP);  Surgeon: Charlott Rakes, MD;  Location: [facility] ENDOSCOPY;  Service: Endoscopy;  Laterality: N/A;  EGD and COLON    Prior to Admission medications   Medication Sig Start Date End Date Taking? Authorizing Provider  amLODipine (NORVASC) 5 MG tablet Take 5 mg by mouth daily.   Yes [provider]  aspirin EC 81 MG tablet Take 81 mg by mouth daily.   Yes [provider]  lisinopril-hydrochlorothiazide (PRINZIDE,ZESTORETIC) 20-25 MG tablet Take 1 tablet by mouth daily.   Yes [provider]  metFORMIN (GLUCOPHAGE-XR) 500 MG 24 hr tablet Take 500 mg by mouth daily with breakfast.   Yes [provider]  metoprolol succinate (TOPROL-XL) 25 MG 24 hr tablet Take 25 mg by mouth daily. 07/03/20  Yes [provider]  rosuvastatin (CRESTOR) 10 MG tablet Take 10 mg by mouth daily. 07/01/20  Yes [provider]  triamcinolone cream (KENALOG) 0.1 % Apply 1 application topically every 8 (eight) hours as needed (itching).    Yes [provider]    Scheduled Meds: . insulin aspart  0-9 Units Subcutaneous Q4H  . metoprolol succinate  25 mg Oral QHS  . rosuvastatin  10 mg Oral Daily  . sodium chloride flush  3 mL Intravenous Q12H   Continuous  Infusions: PRN Meds:.acetaminophen **OR** acetaminophen, ondansetron **OR** ondansetron (ZOFRAN) IV  Allergies as of 04/17/2021  . (No Known Allergies)    Family History  Problem Relation Age of Onset  . Anemia Mother   . Stroke Mother   . Anemia Daughter      Social History   Socioeconomic History  . Marital status: Widowed    Spouse name: Not on file  . Number of children: 4  . Years of education: Not on file  . Highest education level: Not on file  Occupational History  . Not on file  Tobacco Use  . Smoking status: Former Smoker    Packs/day: 1.00    Years: 29.00    Pack years: 29.00    Types: Cigarettes    Quit date: 1993    Years since quitting: 29.3  . Smokeless tobacco: Never Used  . Tobacco comment: SMOKED FOR 29 YEARS   Vaping Use  . Vaping Use: Never used  Substance and Sexual Activity  . Alcohol use: No  . Drug use: No  . Sexual activity: Not on file  Other Topics Concern  . Not on file  Social History Narrative  . Not on file   Social Determinants of Health   Financial Resource Strain: Not on file  Food Insecurity: Not on file  Transportation Needs: Not on file  Physical Activity: Not on file  Stress: Not on file  Social Connections: Not on file  Intimate Partner Violence: Not on file    Review of Systems: Review of Systems  Constitutional: Positive for malaise/fatigue. Negative for chills, fever and weight loss.  HENT: Negative for hearing loss.   Eyes: Negative for pain and redness.  Respiratory: Positive for shortness of breath. Negative for cough.   Cardiovascular: Positive for palpitations. Negative for chest pain.  Gastrointestinal: Positive for constipation. Negative for abdominal pain, blood in stool, diarrhea, heartburn, melena, nausea and vomiting.  Genitourinary: Negative for flank pain and hematuria.  Musculoskeletal: Negative for falls and myalgias.  Skin: Negative for itching and rash.  Neurological: Negative for seizures and loss of consciousness.  Endo/Heme/Allergies: Negative for polydipsia. Does not bruise/bleed easily.  Psychiatric/Behavioral: Negative for substance abuse. The patient is not nervous/anxious.      Physical Exam: Vital signs: Vitals:   04/18/21 0900 04/18/21  0930  BP: 127/68 129/75  Pulse: 89 87  Resp: 17 17  Temp:    SpO2: 98% 99%      Physical Exam Vitals reviewed.  Constitutional:      General: She is not in acute distress. HENT:     Head: Normocephalic and atraumatic.     Nose: Nose normal. No congestion.     Mouth/Throat:     Mouth: Mucous membranes are moist.     Pharynx: Oropharynx is clear.  Eyes:     Extraocular Movements: Extraocular movements intact.     Comments: Conjunctival pallor  Cardiovascular:     Rate and Rhythm: Normal rate and regular rhythm.  Pulmonary:     Effort: Pulmonary effort is normal.     Breath sounds: Normal breath sounds.  Abdominal:     General: Bowel sounds are normal. There is no distension.     Palpations: Abdomen is soft. There is no mass.     Tenderness: There is no abdominal tenderness. There is no guarding or rebound.     Hernia: No hernia is present.  Musculoskeletal:     Cervical back: Normal range of motion and neck supple.  Right lower leg: Edema present.     Left lower leg: Edema present.  Skin:    General: Skin is warm and dry.  Neurological:     General: No focal deficit present.     Mental Status: She is oriented to person, place, and time. She is lethargic.  Psychiatric:        Mood and Affect: Mood normal.        Behavior: Behavior normal. Behavior is cooperative.     GI:  Lab Results: Recent Labs    04/17/21 2210 04/18/21 0605  WBC 6.9 5.6  HGB 4.2* 4.2*  HCT 15.8* 15.4*  PLT 297 280   BMET Recent Labs    04/17/21 2210 04/18/21 0605  NA 133* 137  K 3.4* 3.6  CL 103 105  CO2 20* 25  GLUCOSE 114* 127*  BUN 25* 24*  CREATININE 1.49* 1.41*  CALCIUM 8.7* 8.8*   LFT No results for input(s): PROT, ALBUMIN, AST, ALT, ALKPHOS, BILITOT, BILIDIR, IBILI in the last 72 hours. PT/INR No results for input(s): LABPROT, INR in the last 72 hours.   Studies/Results: DG Chest 2 View  Result Date: 04/17/2021 CLINICAL DATA:  Shortness of breath EXAM: CHEST -  2 VIEW COMPARISON:  07/10/2020 FINDINGS: No consolidation or edema. No pleural effusion or pneumothorax. Cardiomediastinal contours are within normal limits. No acute osseous abnormality. IMPRESSION: No acute process in the chest. Electronically Signed   By: Guadlupe Spanish M.D.   On: 04/17/2021 20:46    Impression: Symptomatic anemia: History of duodenal AVMs, as well as one colonic AVM.  No frank GI bleeding.  Presentation most consistent with chronic blood loss from AVMs. -Hemoglobin 4.2 on arrival, 4.2 after 1u pRBCs (inadequate rise).  Decreased from 7.5 as of 07/2020.  No more recent CBC available electronically. -BUN 24/creatinine 1.41  Recent EKG with A fib, patient currently in regular rhythm and has not been started on blood thinner  Plan: EGD tomorrow for further evaluation.  I thoroughly discussed the procedure with the patient to include nature, alternatives, benefits, and risks (including but not limited to bleeding, infection, perforation, anesthesia/cardiac and pulmonary complications).  Patient verbalized understanding and gave verbal consent to proceed with EGD.  Start Protonix 40 mg IV twice daily.  Clear liquid diet, NPO after midnight.  Continue monitor H&H with transfusion as needed to maintain hemoglobin greater than 7.  [facility] will follow.   LOS: 0 days   Edrick Kins  PA-C 04/18/2021, 10:48 AM  Contact #  [phone]

## 2021-04-18 NOTE — ED Notes (Signed)
Heat pack applied to right hand, elevated with pillow for comfort per pt request.

## 2021-04-19 ENCOUNTER — Inpatient Hospital Stay (HOSPITAL_COMMUNITY): Payer: Medicare Other | Admitting: Certified Registered"

## 2021-04-19 ENCOUNTER — Inpatient Hospital Stay (HOSPITAL_COMMUNITY): Payer: Medicare Other

## 2021-04-19 ENCOUNTER — Encounter (HOSPITAL_COMMUNITY): Payer: Self-pay | Admitting: Internal Medicine

## 2021-04-19 ENCOUNTER — Encounter (HOSPITAL_COMMUNITY)
Admission: EM | Disposition: A | Discharge status: Home / Self Care | Payer: Self-pay | Source: Home / Self Care | Attending: Internal Medicine

## 2021-04-19 DIAGNOSIS — R600 Localized edema: Secondary | ICD-10-CM | POA: Diagnosis not present

## 2021-04-19 DIAGNOSIS — E119 Type 2 diabetes mellitus without complications: Secondary | ICD-10-CM | POA: Diagnosis not present

## 2021-04-19 DIAGNOSIS — I5023 Acute on chronic systolic (congestive) heart failure: Secondary | ICD-10-CM

## 2021-04-19 DIAGNOSIS — I480 Paroxysmal atrial fibrillation: Secondary | ICD-10-CM

## 2021-04-19 DIAGNOSIS — N179 Acute kidney failure, unspecified: Secondary | ICD-10-CM

## 2021-04-19 DIAGNOSIS — R609 Edema, unspecified: Secondary | ICD-10-CM

## 2021-04-19 DIAGNOSIS — D649 Anemia, unspecified: Secondary | ICD-10-CM | POA: Diagnosis not present

## 2021-04-19 HISTORY — PX: ESOPHAGOGASTRODUODENOSCOPY: SHX5428

## 2021-04-19 LAB — ECHOCARDIOGRAM COMPLETE
AR max vel: 1.28 cm2
AV Area VTI: 1.23 cm2
AV Area mean vel: 1.44 cm2
AV Mean grad: 6 mmHg
AV Peak grad: 13.8 mmHg
Ao pk vel: 1.86 m/s
Area-P 1/2: 4.96 cm2
Height: 61 in
MV M vel: 5.58 m/s
MV Peak grad: 124.5 mmHg
S' Lateral: 3.81 cm
Weight: 2592.61 oz

## 2021-04-19 LAB — TYPE AND SCREEN
ABO/RH(D): B POS
Antibody Screen: POSITIVE
Donor AG Type: NEGATIVE
Donor AG Type: NEGATIVE
PT AG Type: NEGATIVE
Unit division: 0
Unit division: 0

## 2021-04-19 LAB — COMPREHENSIVE METABOLIC PANEL
ALT: 39 U/L (ref 0–44)
AST: 34 U/L (ref 15–41)
Albumin: 3.1 g/dL — ABNORMAL LOW (ref 3.5–5.0)
Alkaline Phosphatase: 112 U/L (ref 38–126)
Anion gap: 10 (ref 5–15)
BUN: 19 mg/dL (ref 8–23)
CO2: 23 mmol/L (ref 22–32)
Calcium: 8.9 mg/dL (ref 8.9–10.3)
Chloride: 105 mmol/L (ref 98–111)
Creatinine, Ser: 1.26 mg/dL — ABNORMAL HIGH (ref 0.44–1.00)
GFR, Estimated: 43 mL/min — ABNORMAL LOW (ref >60)
Glucose, Bld: 109 mg/dL — ABNORMAL HIGH (ref 70–99)
Potassium: 3.4 mmol/L — ABNORMAL LOW (ref 3.5–5.1)
Sodium: 138 mmol/L (ref 135–145)
Total Bilirubin: 1 mg/dL (ref 0.3–1.2)
Total Protein: 6.2 g/dL — ABNORMAL LOW (ref 6.5–8.1)

## 2021-04-19 LAB — RETICULOCYTES
Immature Retic Fract: 7.4 % (ref 2.3–15.9)
RBC.: 3.3 MIL/uL — ABNORMAL LOW (ref 3.87–5.11)
Retic Count, Absolute: 29.4 10*3/uL (ref 19.0–186.0)
Retic Ct Pct: 0.9 % (ref 0.4–3.1)

## 2021-04-19 LAB — GLUCOSE, CAPILLARY
Glucose-Capillary: 103 mg/dL — ABNORMAL HIGH (ref 70–99)
Glucose-Capillary: 104 mg/dL — ABNORMAL HIGH (ref 70–99)
Glucose-Capillary: 118 mg/dL — ABNORMAL HIGH (ref 70–99)
Glucose-Capillary: 118 mg/dL — ABNORMAL HIGH (ref 70–99)
Glucose-Capillary: 120 mg/dL — ABNORMAL HIGH (ref 70–99)
Glucose-Capillary: 120 mg/dL — ABNORMAL HIGH (ref 70–99)
Glucose-Capillary: 98 mg/dL (ref 70–99)

## 2021-04-19 LAB — BRAIN NATRIURETIC PEPTIDE: B Natriuretic Peptide: 994.6 pg/mL — ABNORMAL HIGH (ref 0.0–100.0)

## 2021-04-19 LAB — HEMOGLOBIN A1C
Hgb A1c MFr Bld: 6.3 % — ABNORMAL HIGH (ref 4.8–5.6)
Mean Plasma Glucose: 134 mg/dL

## 2021-04-19 LAB — BPAM RBC
Blood Product Expiration Date: 202205242359
Blood Product Expiration Date: 202205282359
ISSUE DATE / TIME: 202205120436
ISSUE DATE / TIME: 202205120814
Unit Type and Rh: 5100
Unit Type and Rh: 7300

## 2021-04-19 LAB — FOLATE: Folate: 16.8 ng/mL (ref >5.9)

## 2021-04-19 LAB — MAGNESIUM: Magnesium: 2.2 mg/dL (ref 1.7–2.4)

## 2021-04-19 LAB — VITAMIN B12: Vitamin B-12: 258 pg/mL (ref 180–914)

## 2021-04-19 SURGERY — EGD (ESOPHAGOGASTRODUODENOSCOPY)
Anesthesia: Monitor Anesthesia Care

## 2021-04-19 MED ORDER — SODIUM CHLORIDE 0.9 % IV SOLN: INTRAVENOUS | Status: DC

## 2021-04-19 MED ORDER — SENNOSIDES-DOCUSATE SODIUM 8.6-50 MG PO TABS
1.0000 | ORAL_TABLET | Freq: Every day | ORAL | Status: DC
  Administered 2021-04-19: 1 via ORAL
  Filled 2021-04-19: qty 1

## 2021-04-19 MED ORDER — POTASSIUM CHLORIDE CRYS ER 10 MEQ PO TBCR
40.0000 meq | EXTENDED_RELEASE_TABLET | Freq: Once | ORAL | Status: AC
  Administered 2021-04-19: 40 meq via ORAL
  Filled 2021-04-19: qty 4

## 2021-04-19 MED ORDER — PROPOFOL 10 MG/ML IV BOLUS
INTRAVENOUS | Status: DC | PRN
  Administered 2021-04-19 (×4): 10 mg via INTRAVENOUS

## 2021-04-19 MED ORDER — LACTATED RINGERS IV SOLN: INTRAVENOUS | Status: DC

## 2021-04-19 MED ORDER — PANTOPRAZOLE SODIUM 40 MG PO TBEC
40.0000 mg | DELAYED_RELEASE_TABLET | Freq: Two times a day (BID) | ORAL | Status: DC
  Administered 2021-04-20: 40 mg via ORAL
  Filled 2021-04-19: qty 1

## 2021-04-19 MED ORDER — PROPOFOL 500 MG/50ML IV EMUL
INTRAVENOUS | Status: DC | PRN
  Administered 2021-04-19: 100 ug/kg/min via INTRAVENOUS

## 2021-04-19 MED ORDER — SODIUM CHLORIDE 0.9 % IV SOLN
510.0000 mg | Freq: Once | INTRAVENOUS | Status: AC
  Administered 2021-04-19: 510 mg via INTRAVENOUS
  Filled 2021-04-19: qty 510

## 2021-04-19 MED ORDER — FUROSEMIDE 40 MG PO TABS
40.0000 mg | ORAL_TABLET | Freq: Every day | ORAL | Status: DC
  Administered 2021-04-20: 40 mg via ORAL
  Filled 2021-04-19: qty 1

## 2021-04-19 MED ORDER — METOPROLOL SUCCINATE ER 50 MG PO TB24
100.0000 mg | ORAL_TABLET | Freq: Every day | ORAL | Status: DC
  Administered 2021-04-19: 100 mg via ORAL
  Filled 2021-04-19: qty 2

## 2021-04-19 MED ORDER — POTASSIUM CHLORIDE ER 10 MEQ PO TBCR
20.0000 meq | EXTENDED_RELEASE_TABLET | Freq: Every day | ORAL | Status: DC
  Administered 2021-04-19 – 2021-04-20 (×2): 20 meq via ORAL
  Filled 2021-04-19 (×4): qty 2

## 2021-04-19 MED ORDER — PROPOFOL 500 MG/50ML IV EMUL
INTRAVENOUS | Status: AC
  Filled 2021-04-19: qty 50

## 2021-04-19 MED ORDER — METOPROLOL SUCCINATE ER 50 MG PO TB24: 50.0000 mg | ORAL_TABLET | Freq: Every day | ORAL | Status: DC

## 2021-04-19 MED ORDER — FUROSEMIDE 10 MG/ML IJ SOLN
20.0000 mg | Freq: Once | INTRAMUSCULAR | Status: AC
  Administered 2021-04-19: 20 mg via INTRAVENOUS
  Filled 2021-04-19: qty 2

## 2021-04-19 MED ORDER — LIDOCAINE 2% (20 MG/ML) 5 ML SYRINGE
INTRAMUSCULAR | Status: DC | PRN
  Administered 2021-04-19: 40 mg via INTRAVENOUS

## 2021-04-19 NOTE — Consult Note (Addendum)
CARDIOLOGY CONSULT NOTE  Patient ID: Jasmin Miller MRN: [id number] DOB/AGE: 80/03/1941 80 y.o.  Admit date: 04/17/2021 Referring Physician  Vella Redhead, MD Primary Physician:  Lindaann Pascal, PA-C Reason for Consultation  A. Fib and CHF  Patient ID: Jasmin Miller, female    DOB: 1941/06/06, 80 y.o.   MRN: [id number]  Chief Complaint  Patient presents with  . Abnormal ECG   HPI:    Jasmin Miller  is a 80 y.o. African-American female with hypertension, hyperlipidemia, type 2 diabetes mellitus, chronic iron deficiency anemia with suspected blood loss from colonic AVMs, admitted via emergency room on 04/17/2021 with worsening dyspnea, fatigue and was found to be severely anemic.  I was consulted to manage her heart failure and also atrial arrhythmia.  Patient presently states that she is doing well and denies any chest pain, shortness of breath, leg edema, PND or orthopnea.  States that all her symptoms are essentially resolved since being in the hospital.  Past Medical History:  Diagnosis Date  . DM2 (diabetes mellitus, type 2) (HCC)   . Hypertension   . MI (myocardial infarction) (HCC)   . Symptomatic anemia    11/2018   Past Surgical History:  Procedure Laterality Date  . ABDOMINAL HYSTERECTOMY    . BIOPSY  07/12/2020   Procedure: BIOPSY;  Surgeon: Charlott Rakes, MD;  Location: [facility] ENDOSCOPY;  Service: Endoscopy;;  . COLONOSCOPY  07/12/2020  . COLONOSCOPY WITH PROPOFOL N/A 07/12/2020   Procedure: COLONOSCOPY WITH PROPOFOL;  Surgeon: Charlott Rakes, MD;  Location: [facility] ENDOSCOPY;  Service: Endoscopy;  Laterality: N/A;  . ESOPHAGOGASTRODUODENOSCOPY  07/12/2020  . ESOPHAGOGASTRODUODENOSCOPY N/A 07/12/2020   Procedure: ESOPHAGOGASTRODUODENOSCOPY (EGD);  Surgeon: Charlott Rakes, MD;  Location: [facility] ENDOSCOPY;  Service: Endoscopy;  Laterality: N/A;  . HOT HEMOSTASIS N/A 07/12/2020   Procedure: HOT HEMOSTASIS (ARGON PLASMA COAGULATION/BICAP);  Surgeon: Charlott Rakes, MD;  Location: [facility]  ENDOSCOPY;  Service: Endoscopy;  Laterality: N/A;  EGD and COLON   Social History   Tobacco Use  . Smoking status: Former Smoker    Packs/day: 1.00    Years: 29.00    Pack years: 29.00    Types: Cigarettes    Quit date: 1993    Years since quitting: 29.3  . Smokeless tobacco: Never Used  . Tobacco comment: SMOKED FOR 29 YEARS   Substance Use Topics  . Alcohol use: No    Family History  Problem Relation Age of Onset  . Anemia Mother   . Stroke Mother   . Anemia Daughter     Marital Sttus: Widowed  ROS  Review of Systems  Cardiovascular: Negative for chest pain, dyspnea on exertion and leg swelling.  Gastrointestinal: Negative for melena.  All other systems reviewed and are negative.  Objective   Vitals with BMI 04/19/2021 04/19/2021 04/19/2021  Height - - -  Weight - - -  BMI - - -  Systolic 141 131 267  Diastolic 61 60 60  Pulse 89 93 -    Blood pressure (!) 141/61, pulse 89, temperature 98.4 F (36.9 C), resp. rate 20, height 5\' 1"  (1.549 m), weight 73.5 kg, SpO2 97 %.    Physical Exam Constitutional:      Appearance: She is obese.  HENT:     Mouth/Throat:     Mouth: Mucous membranes are moist.  Eyes:     Extraocular Movements: Extraocular movements intact.  Neck:     Vascular: No carotid bruit or JVD.  Cardiovascular:     Rate and Rhythm: Normal  rate and regular rhythm.     Pulses: Normal pulses and intact distal pulses.          Carotid pulses are on the right side with bruit.    Heart sounds: Murmur heard.   Midsystolic murmur is present with a grade of 2/6 at the apex. No gallop.   Pulmonary:     Effort: Pulmonary effort is normal.     Breath sounds: Normal breath sounds.  Abdominal:     General: Bowel sounds are normal.     Palpations: Abdomen is soft.  Musculoskeletal:        General: No swelling.     Cervical back: Normal range of motion and neck supple.  Neurological:     Mental Status: She is alert.    Laboratory examination:   Recent  Labs    07/10/20 1613 07/11/20 0644 07/12/20 0335 04/17/21 2210 04/18/21 0605 04/19/21 0736  NA 142 142 144 133* 137 138  K 3.4* 3.4* 4.0 3.4* 3.6 3.4*  CL 108 108 111 103 105 105  CO2 24 23 23  20* 25 23  GLUCOSE 93 115* 109* 114* 127* 109*  BUN 21 18 10  25* 24* 19  CREATININE 1.27* 1.06* 0.88 1.49* 1.41* 1.26*  CALCIUM 8.9 9.0 8.7* 8.7* 8.8* 8.9  GFRNONAA 40* 50* >60 35* 38* 43*  GFRAA 46* 58* >60  --   --   --    estimated creatinine clearance is 32.7 mL/min (A) (by C-G formula based on SCr of 1.26 mg/dL (H)).  CMP Latest Ref Rng & Units 04/19/2021 04/18/2021 04/17/2021  Glucose 70 - 99 mg/dL 06/18/2021) 06/17/2021) 063(K)  BUN 8 - 23 mg/dL 19 160(F) 093(A)  Creatinine 0.44 - 1.00 mg/dL 35(T) 73(U) 2.02(R)  Sodium 135 - 145 mmol/L 138 137 133(L)  Potassium 3.5 - 5.1 mmol/L 3.4(L) 3.6 3.4(L)  Chloride 98 - 111 mmol/L 105 105 103  CO2 22 - 32 mmol/L 23 25 20(L)  Calcium 8.9 - 10.3 mg/dL 8.9 4.27(C) 6.23(J)  Total Protein 6.5 - 8.1 g/dL 6.2(L) - -  Total Bilirubin 0.3 - 1.2 mg/dL 1.0 - -  Alkaline Phos 38 - 126 U/L 112 - -  AST 15 - 41 U/L 34 - -  ALT 0 - 44 U/L 39 - -   CBC Latest Ref Rng & Units 04/18/2021 04/18/2021 04/18/2021  WBC 4.0 - 10.5 K/uL - - -  Hemoglobin 12.0 - 15.0 g/dL 7.1(L) 7.1(L) 6.8(LL)  Hematocrit 36.0 - 46.0 % 22.9(L) 23.1(L) 22.4(L)  Platelets 150 - 400 K/uL - - -   Lipid Panel No results for input(s): CHOL, TRIG, LDLCALC, VLDL, HDL, CHOLHDL, LDLDIRECT in the last 8760 hours.  HEMOGLOBIN A1C Lab Results  Component Value Date   HGBA1C 6.3 (H) 04/18/2021   MPG 134 04/18/2021   TSH No results for input(s): TSH in the last 8760 hours. BNP (last 3 results) Recent Labs    04/17/21 2210 04/19/21 0736  BNP 463.0* 994.6*     Ref Range & Units 04/17/2021  Troponin I (High Sensitivity) <18 ng/L 59High      Medications and allergies  No Known Allergies   Current Meds  Medication Sig  . amLODipine (NORVASC) 5 MG tablet Take 5 mg by mouth daily.  04/21/21  aspirin EC 81 MG tablet Take 81 mg by mouth daily.  06/17/2021 lisinopril-hydrochlorothiazide (PRINZIDE,ZESTORETIC) 20-25 MG tablet Take 1 tablet by mouth daily.  . metFORMIN (GLUCOPHAGE-XR) 500 MG 24 hr tablet Take 500 mg by mouth daily  with breakfast.  . metoprolol succinate (TOPROL-XL) 25 MG 24 hr tablet Take 25 mg by mouth daily.  . rosuvastatin (CRESTOR) 10 MG tablet Take 10 mg by mouth daily.  Marland Kitchen triamcinolone cream (KENALOG) 0.1 % Apply 1 application topically every 8 (eight) hours as needed (itching).     Scheduled Meds: . insulin aspart  0-9 Units Subcutaneous Q4H  . metoprolol succinate  50 mg Oral QHS  . [START ON 04/20/2021] pantoprazole  40 mg Oral BID AC  . rosuvastatin  10 mg Oral Daily  . senna-docusate  1 tablet Oral QHS  . sodium chloride flush  3 mL Intravenous Q12H   Continuous Infusions: . ferumoxytol     PRN Meds:.acetaminophen **OR** acetaminophen, ondansetron **OR** ondansetron (ZOFRAN) IV   I/O last 3 completed shifts: In: 2136.8 [P.O.:480; I.V.:81.8; Blood:1575] Out: -  Total I/O In: 150 [I.V.:150] Out: -     Radiology:   DG Chest 2 View 04/17/2021 CLINICAL DATA:  Shortness of breath EXAM: CHEST - 2 VIEW COMPARISON:  07/10/2020 FINDINGS: No consolidation or edema. No pleural effusion or pneumothorax. Cardiomediastinal contours are within normal limits. No acute osseous abnormality. IMPRESSION: No acute process in the chest. Electronically Signed   By: Guadlupe Spanish M.D.   On: 04/17/2021 20:46   Cardiac Studies:   Echocardiogram 04/19/2021:   1. Left ventricular ejection fraction, by estimation, is 50%. The left ventricle has mildly decreased function. The left ventricle demonstrates regional wall motion abnormalities with severe hypokinesis of the basal inferior and basal inferolateral  walls, hypokinesis of the basal anterolateral wall. Left ventricular diastolic parameters are consistent with Grade II diastolic dysfunction (pseudonormalization).  2. Right  ventricular systolic function is mildly reduced. The right ventricular size is normal. There is normal pulmonary artery systolic pressure. The estimated right ventricular systolic pressure is 29.3 mmHg.  3. Left atrial size was severely dilated.  4. Right atrial size was mildly dilated.  5. The mitral valve is abnormal. At least moderate to possibly severe mitral valve regurgitation. This may not be fully visualized. Possible infarct-related mitral regurgitation given regional wall motion abnormalities. No evidence of mitral stenosis.  6. The aortic valve is tricuspid. Aortic valve regurgitation is not visualized. Mild aortic valve sclerosis is present, with no evidence of aortic valve stenosis.  7. The inferior vena cava is dilated in size with <50% respiratory variability, suggesting right atrial pressure of 15 mmHg.  Lower Extremity Venous Duplex  04/19/2021: BILATERAL:  - No evidence of deep vein thrombosis seen in the lower extremities,  bilaterally.  - No evidence of superficial venous thrombosis in the lower extremities,  bilaterally.  -No evidence of popliteal cyst, bilaterally.    EKG:  EKG 04/17/2021: Sinus versus ectopic atrial rhythm at rate of 90 bpm, normal axis.  Poor R wave progression, cannot exclude anteroseptal infarct old, nonspecific T abnormality.  Assessment   1.  Acute diastolic heart failure precipitated by severe anemia. 2.  Atrial tachycardia.  Telemetry was reviewed, patient having frequent episodes of atrial tachycardia but no atrial fibrillation.  No indication for anticoagulation, also there is contraindication anticoagulation. 3.  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  Wall motion abnormality noted.  Patient has significant cardiovascular risk factors including hypertension, hyperlipidemia and diabetes mellitus. 4.  Severe iron deficiency anemia, etiology not known, still being worked up.  Except for gastritis by EGD colonoscopy  was negative.  Patient has been taking aspirin at home. 5.  Acute kidney injury related to cardiorenal syndrome  and also severe symptomatic anemia, stage IIIa. 6.  Right carotid bruit  Recommendations:   We will start the patient on p.o. furosemide 20 mg daily, increase metoprolol succinate to 100 mg in view of underlying probable coronary artery disease in view of wall motion abnormality, and in view of acute renal failure, will hold off on ACE inhibitor's or ARB.  We will also hold off on Jardiance for now.  If serum creatinine improves, restart lisinopril HCT that patient was on previously at home.  With regard to possibly significant coronary disease and also right carotid stenosis in view of bruit, will do outpatient evaluation.  Would recommend discontinuing aspirin in view of acute gastritis and life-threatening anemia. Patient states she has had remote MI and details not available.  With regard to atrial tachycardia, she has brief episodes of PAT, there is no atrial fibrillation.  This is probably related to underlying severe anemia.  Hopefully increasing the beta-blocker will help with maintaining rhythm and sinus.  Unless new issues were to arise, please manage primary issue that is severe anemia, patient agrees to follow-up with me in the outpatient basis.  Thank you for the consultation.     Yates DecampJay Jerek Meulemans, MD, Orange City Area Health SystemFACC 04/19/2021, 6:45 PM Office: [phone]

## 2021-04-19 NOTE — Anesthesia Preprocedure Evaluation (Signed)
Anesthesia Evaluation  Patient identified by MRN, date of birth, ID band Patient awake    Reviewed: Allergy & Precautions, NPO status , Patient's Chart, lab work & pertinent test results  Airway Mallampati: II  TM Distance: >3 FB     Dental   Pulmonary former smoker,    breath sounds clear to auscultation       Cardiovascular hypertension, + Past MI   Rhythm:Regular Rate:Normal     Neuro/Psych    GI/Hepatic Neg liver ROS, History noted. CG   Endo/Other  diabetes  Renal/GU Renal disease     Musculoskeletal   Abdominal   Peds  Hematology  (+) anemia ,   Anesthesia Other Findings   Reproductive/Obstetrics                             Anesthesia Physical Anesthesia Plan  ASA: III  Anesthesia Plan: MAC   Post-op Pain Management:    Induction: Intravenous  PONV Risk Score and Plan: 2 and Propofol infusion  Airway Management Planned: Nasal Cannula and Simple Face Mask  Additional Equipment:   Intra-op Plan:   Post-operative Plan:   Informed Consent: I have reviewed the patients History and Physical, chart, labs and discussed the procedure including the risks, benefits and alternatives for the proposed anesthesia with the patient or authorized representative who has indicated his/her understanding and acceptance.     Dental advisory given  Plan Discussed with: CRNA and Anesthesiologist  Anesthesia Plan Comments:         Anesthesia Quick Evaluation

## 2021-04-19 NOTE — Op Note (Signed)
[facility] Patient Name: Jasmin Miller Procedure Date: 04/19/2021 MRN: [id number] Attending MD: Lear Ng , MD Date of Birth: [birth year] CSN: [id number] Age: 80 Admit Type: Inpatient Procedure:                Upper GI endoscopy Indications:              Acute post hemorrhagic anemia Providers:                Lear Ng, MD, Clyde Lundborg, RN,                            Fransico Setters Mbumina, Technician Referring MD:             hospital team Medicines:                Propofol per Anesthesia, Monitored Anesthesia Care Complications:            No immediate complications. Estimated Blood Loss:     Estimated blood loss: none. Procedure:                Pre-Anesthesia Assessment:                           - Prior to the procedure, a History and Physical                            was performed, and patient medications and                            allergies were reviewed. The patient's tolerance of                            previous anesthesia was also reviewed. The risks                            and benefits of the procedure and the sedation                            options and risks were discussed with the patient.                            All questions were answered, and informed consent                            was obtained. Prior Anticoagulants: The patient has                            taken no previous anticoagulant or antiplatelet                            agents. ASA Grade Assessment: III - A patient with                            severe systemic disease. After reviewing the risks  and benefits, the patient was deemed in                            satisfactory condition to undergo the procedure.                           After obtaining informed consent, the endoscope was                            passed under direct vision. Throughout the                            procedure, the patient's blood pressure,  pulse, and                            oxygen saturations were monitored continuously. The                            GIF-H190 ([id number]) Olympus gastroscope was                            introduced through the mouth, and advanced to the                            second part of duodenum. The upper GI endoscopy was                            accomplished without difficulty. The patient                            tolerated the procedure well. Scope In: Scope Out: Findings:      The examined esophagus was normal.      The Z-line was regular and was found 40 cm from the incisors.      Segmental mild inflammation characterized by congestion (edema) and       erythema was found in the gastric antrum.      The cardia and gastric fundus were normal on retroflexion.      The examined duodenum was normal. Impression:               - Normal esophagus.                           - Z-line regular, 40 cm from the incisors.                           - Acute gastritis.                           - Normal examined duodenum.                           - No specimens collected. Moderate Sedation:      N/A - MAC procedure Recommendation:           - Advance diet as tolerated and clear liquid diet.                           -  Observe patient's clinical course. Procedure Code(s):        --- Professional ---                           [phone], Esophagogastroduodenoscopy, flexible,                            transoral; diagnostic, including collection of                            specimen(s) by brushing or washing, when performed                            (separate procedure) Diagnosis Code(s):        --- Professional ---                           D62, Acute posthemorrhagic anemia                           K29.00, Acute gastritis without bleeding CPT copyright 2019 [employer]. All rights reserved. The codes documented in this report are preliminary and upon coder review may  be revised to  meet current compliance requirements. Lear Ng, MD 04/19/2021 12:28:11 PM This report has been signed electronically. Number of Addenda: 0

## 2021-04-19 NOTE — Progress Notes (Addendum)
PROGRESS NOTE    Jasmin Miller  VHQ:[id number] DOB: [birth year] DOA: 04/17/2021 PCP: Lindaann Pascal, PA-C    Chief Complaint  Patient presents with  . Abnormal ECG    Brief Narrative:   Jasmin Miller is a 80 y.o. 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  Subjective:  She remained in and out of afib, she denies chest pain, no hypoxia at rest, she reports leg edema improved after leg elevation She is a strong minded lady, she does not wants to stay in the hospital, she finally agreed to one dose of lasix  And iv iron   Assessment & Plan:   Principal Problem:   Symptomatic anemia Active Problems:   Hypertension associated with diabetes (HCC)   DM2 (diabetes mellitus, type 2) (HCC)   AKI (acute kidney injury) (HCC)   Hyperlipidemia associated with type 2 diabetes mellitus (HCC)   afib, this might contribute to sob as well -per daughter , she has afib last year while in the hospital -she is in and out of afib in the hospital , heart rate range from 94 to 107 -not a candidate for anticoagulation due to h/o AVM and significant anemia ( hgb 4.2 on presentation this time), continue home meds asa   -increase toprol-xl from  qhs to  qhs -continue on tele -cardiology Dr Jacinto Halim consulted   Hypokalemia Replace K  Acute on chronic combined heart failure -presents with sob, bilateral lower extremity edema ( reports has been having intermittent edema for a while , it is nothing new to her, it will get better with leg elevation, initially she declined lasix, she reports was on lasix years ago, was taken off for unclear reason, -she later agreed to trial dose of iv lasix, order placed  -cxr no acute findings -venous doppler no DVT -cardiology Dr Jacinto Halim consulted   CAD /MI in the past  Denies chest  pain On asa  and creastor   AKI on CKDII, possible cardiorenal  -aki presents on admission,  -bun/cr on presentation was 25/1.49, baseline creatinine 0.88 from August 2021 -Creatinine improving -Repeat BMP in the morning -Renal dosing medication, hold lisinopril-hctz, hold metoprolol   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 Advance diet as tolerated. No further GI procedures planned at this time. Will sign off. Call if questions. F/U with GI in 4-6 weeks" on ppi   Non-insulin-dependent type 2 diabetes, controlled A1c 6.3 AM blood glucose is 109 Continue carb modified diet  HTN:  Discontinue norvasc this could cause edema as well Hold lisinopril/hctz due to aki Increase betablocker for heart rate control    Body mass index is 30.62 kg/m.Marland Kitchen     Unresulted Labs (From admission, onward)          Start     Ordered   04/20/21 0500  Brain natriuretic peptide  Tomorrow morning,   R        04/19/21 1736  04/18/21 0056  Vitamin B12  (Anemia Panel (PNL))  Add-on,   AD        04/18/21 0055   04/18/21 0056  Iron and TIBC  (Anemia Panel (PNL))  Add-on,   AD        04/18/21 0055   04/18/21 0056  Ferritin  (Anemia Panel (PNL))  Add-on,   AD        04/18/21 0055   Unscheduled  Occult blood card to lab, stool  As needed,   R      04/18/21 0917            DVT prophylaxis: SCDs Start: 04/18/21 0133   Code Status:DNR Family Communication: daughter over the phone with permission Disposition:   Status is: Inpatient  Dispo: The patient is from: home              Anticipated d/c is to: home              Anticipated  d/c date is: possible tomorrow                Consultants:   GI  Cardiology   Procedures:   egd  Antimicrobials:    none    Objective: Vitals:   04/19/21 1237 04/19/21 1244 04/19/21 1245 04/19/21 1307  BP:  131/60 131/60 (!) 141/61  Pulse: 94  93 89  Resp: (!) 21  (!) 32 20  Temp:    98.4 F (36.9 C)  TempSrc:      SpO2: 100%  94% 97%  Weight:      Height:        Intake/Output Summary (Last 24 hours) at 04/19/2021 1737 Last data filed at 04/19/2021 1222 Gross per 24 hour  Intake 150 ml  Output --  Net 150 ml   Filed Weights   04/17/21 1957 04/17/21 2130 04/19/21 1117  Weight: 73.5 kg 73.5 kg 73.5 kg    Examination:  General exam: calm, NAD Respiratory system: Clear to auscultation. Respiratory effort normal. Cardiovascular system: S1 & S2 heard, IRRR.  Gastrointestinal system: Abdomen is nondistended, soft and nontender. Normal bowel sounds heard. Central nervous system: Alert and oriented. No focal neurological deficits. Extremities: trace pitting edema lower extremity, appear has improved compare to yesterday Skin: No rashes, lesions or ulcers Psychiatry: Judgement and insight appear normal. Mood & affect appropriate.     Data Reviewed: I have personally reviewed following labs and imaging studies  CBC: Recent Labs  Lab 04/17/21 2210 04/18/21 0605 04/18/21 1330 04/18/21 1522 04/18/21 2306  WBC 6.9 5.6  --   --   --   NEUTROABS 4.5  --   --   --   --   HGB 4.2* 4.2* 6.8* 7.1* 7.1*  HCT 15.8* 15.4* 22.4* 23.1* 22.9*  MCV 64.5* 64.4*  --   --   --   PLT 297 280  --   --   --     Basic Metabolic Panel: Recent Labs  Lab 04/17/21 2210 04/18/21 0605 04/19/21 0736  NA 133* 137 138  K 3.4* 3.6 3.4*  CL 103 105 105  CO2 20* 25 23  GLUCOSE 114* 127* 109*  BUN 25* 24* 19  CREATININE 1.49* 1.41* 1.26*  CALCIUM 8.7* 8.8* 8.9  MG  --   --  2.2    GFR: Estimated Creatinine Clearance: 32.7 mL/min (A) (by C-G formula based on SCr of 1.26  mg/dL (H)).  Liver Function Tests: Recent Labs  Lab 04/19/21 [phone]  AST 34  ALT 39  ALKPHOS 112  BILITOT 1.0  PROT 6.2*  ALBUMIN 3.1*    CBG: Recent Labs  Lab 04/19/21 0002 04/19/21 0418 04/19/21 0808 04/19/21 1304 04/19/21 1616  GLUCAP 104* 98 120* 103* 118*     Recent Results (from the past 240 hour(s))  Resp Panel by RT-PCR (Flu A&B, Covid) Nasopharyngeal Swab     Status: None   Collection Time: 04/18/21 12:58 AM   Specimen: Nasopharyngeal Swab; Nasopharyngeal(NP) swabs in vial transport medium  Result Value Ref Range Status   SARS Coronavirus 2 by RT PCR NEGATIVE NEGATIVE Final    Comment: (NOTE) SARS-CoV-2 target nucleic acids are NOT DETECTED.  The SARS-CoV-2 RNA is generally detectable in upper respiratory specimens during the acute phase of infection. The lowest concentration of SARS-CoV-2 viral copies this assay can detect is 138 copies/mL. A negative result does not preclude SARS-Cov-2 infection and should not be used as the sole basis for treatment or other patient management decisions. A negative result may occur with  improper specimen collection/handling, submission of specimen other than nasopharyngeal swab, presence of viral mutation(s) within the areas targeted by this assay, and inadequate number of viral copies(<138 copies/mL). A negative result must be combined with clinical observations, patient history, and epidemiological information. The expected result is Negative.  Fact Sheet for Patients:  BloggerCourse.com  Fact Sheet for Healthcare Providers:  SeriousBroker.it  This test is no t yet approved or cleared by the [place] FDA and  has been authorized for detection and/or diagnosis of SARS-CoV-2 by FDA under an Emergency Use Authorization (EUA). This EUA will remain  in effect (meaning this test can be used) for the duration of the COVID-19 declaration under Section 564(b)(1) of the  Act, 21 U.S.C.section 360bbb-3(b)(1), unless the authorization is terminated  or revoked sooner.       Influenza A by PCR NEGATIVE NEGATIVE Final   Influenza B by PCR NEGATIVE NEGATIVE Final    Comment: (NOTE) The Xpert Xpress SARS-CoV-2/FLU/RSV plus assay is intended as an aid in the diagnosis of influenza from Nasopharyngeal swab specimens and should not be used as a sole basis for treatment. Nasal washings and aspirates are unacceptable for Xpert Xpress SARS-CoV-2/FLU/RSV testing.  Fact Sheet for Patients: BloggerCourse.com  Fact Sheet for Healthcare Providers: SeriousBroker.it  This test is not yet approved or cleared by the [place] FDA and has been authorized for detection and/or diagnosis of SARS-CoV-2 by FDA under an Emergency Use Authorization (EUA). This EUA will remain in effect (meaning this test can be used) for the duration of the COVID-19 declaration under Section 564(b)(1) of the Act, 21 U.S.C. section 360bbb-3(b)(1), unless the authorization is terminated or revoked.  Performed at [facility], [address] [place], [place] [address]          Radiology Studies: DG Chest 2 View  Result Date: 04/17/2021 CLINICAL DATA:  Shortness of breath EXAM: CHEST - 2 VIEW COMPARISON:  07/10/2020 FINDINGS: No consolidation or edema. No pleural effusion or pneumothorax. Cardiomediastinal contours are within normal limits. No acute osseous abnormality. IMPRESSION: No acute process in the chest. Electronically Signed   By: Guadlupe Spanish M.D.   On: 04/17/2021 20:46   ECHOCARDIOGRAM COMPLETE  Result Date: 04/19/2021    ECHOCARDIOGRAM REPORT   Patient Name:   Jasmin Miller Date of Exam: 04/19/2021 Medical Rec #:  [id number]      Height:       61.0 in Accession #:  [id number]     Weight:       162.0 lb Date of Birth:  March 31, 1941      BSA:          1.727 m Patient Age:    80 years       BP:            141/61 mmHg Patient Gender: F              HR:           89 bpm. Exam Location:  Inpatient Procedure: 2D Echo, Cardiac Doppler and Color Doppler Indications:    Lower extremity edema  History:        Patient has no prior history of Echocardiogram examinations.                 Previous Myocardial Infarction; Risk Factors:Hypertension and                 Diabetes.  Sonographer:    Shirlean KellyJohn Mendel Brown Referring Phys: [id number] Maisy Newport IMPRESSIONS  1. Left ventricular ejection fraction, by estimation, is 50%. The left ventricle has mildly decreased function. The left ventricle demonstrates regional wall motion abnormalities with severe hypokinesis of the basal inferior and basal inferolateral walls, hypokinesis of the basal anterolateral wall. Left ventricular diastolic parameters are consistent with Grade II diastolic dysfunction (pseudonormalization).  2. Right ventricular systolic function is mildly reduced. The right ventricular size is normal. There is normal pulmonary artery systolic pressure. The estimated right ventricular systolic pressure is 29.3 mmHg.  3. Left atrial size was severely dilated.  4. Right atrial size was mildly dilated.  5. The mitral valve is abnormal. At least moderate to possibly severe mitral valve regurgitation. This may not be fully visualized. Possible infarct-related mitral regurgitation given regional wall motion abnormalities. No evidence of mitral stenosis.  6. The aortic valve is tricuspid. Aortic valve regurgitation is not visualized. Mild aortic valve sclerosis is present, with no evidence of aortic valve stenosis.  7. The inferior vena cava is dilated in size with <50% respiratory variability, suggesting right atrial pressure of 15 mmHg. FINDINGS  Left Ventricle: Left ventricular ejection fraction, by estimation, is 50%. The left ventricle has mildly decreased function. The left ventricle demonstrates regional wall motion abnormalities. The left ventricular internal cavity size was  normal in size. There is no left ventricular hypertrophy. Left ventricular diastolic parameters are consistent with Grade II diastolic dysfunction (pseudonormalization). Right Ventricle: The right ventricular size is normal. No increase in right ventricular wall thickness. Right ventricular systolic function is mildly reduced. There is normal pulmonary artery systolic pressure. The tricuspid regurgitant velocity is 1.89 m/s, and with an assumed right atrial pressure of 15 mmHg, the estimated right ventricular systolic pressure is 29.3 mmHg. Left Atrium: Left atrial size was severely dilated. Right Atrium: Right atrial size was mildly dilated. Pericardium: There is no evidence of pericardial effusion. Mitral Valve: The mitral valve is abnormal. There is mild calcification of the mitral valve leaflet(s). Mild mitral annular calcification. Moderate to severe mitral valve regurgitation. No evidence of mitral valve stenosis. MV peak gradient, 11.2 mmHg. The mean mitral valve gradient is 3.0 mmHg. Tricuspid Valve: The tricuspid valve is normal in structure. Tricuspid valve regurgitation is trivial. Aortic Valve: The aortic valve is tricuspid. Aortic valve regurgitation is not visualized. Mild aortic valve sclerosis is present, with no evidence of aortic valve stenosis. Aortic valve mean gradient measures 6.0 mmHg. Aortic valve peak gradient measures 13.8 mmHg. Aortic  valve area, by VTI measures 1.23 cm. Pulmonic Valve: The pulmonic valve was normal in structure. Pulmonic valve regurgitation is trivial. Aorta: The aortic root is normal in size and structure. Venous: The inferior vena cava is dilated in size with less than 50% respiratory variability, suggesting right atrial pressure of 15 mmHg. IAS/Shunts: No atrial level shunt detected by color flow Doppler.  LEFT VENTRICLE PLAX 2D LVIDd:         5.14 cm  Diastology LVIDs:         3.80 cm  LV e' medial:    5.00 cm/s LV PW:         1.09 cm  LV E/e' medial:  28.4 LV IVS:         1.04 cm  LV e' lateral:   9.25 cm/s LVOT diam:     1.80 cm  LV E/e' lateral: 15.4 LV SV:         38 LV SV Index:   22 LVOT Area:     2.54 cm  RIGHT VENTRICLE            IVC RV Basal diam:  4.32 cm    IVC diam: 2.41 cm RV S prime:     9.79 cm/s LEFT ATRIUM              Index       RIGHT ATRIUM           Index LA diam:        4.70 cm  2.72 cm/m  RA Area:     20.20 cm LA Vol (A2C):   100.0 ml 57.91 ml/m RA Volume:   58.40 ml  33.82 ml/m LA Vol (A4C):   98.3 ml  56.92 ml/m LA Biplane Vol: 99.8 ml  57.79 ml/m  AORTIC VALVE AV Area (Vmax):    1.28 cm AV Area (Vmean):   1.44 cm AV Area (VTI):     1.23 cm AV Vmax:           186.00 cm/s AV Vmean:          115.000 cm/s AV VTI:            0.312 m AV Peak Grad:      13.8 mmHg AV Mean Grad:      6.0 mmHg LVOT Vmax:         93.30 cm/s LVOT Vmean:        64.900 cm/s LVOT VTI:          0.151 m LVOT/AV VTI ratio: 0.48  AORTA Ao Root diam: 2.70 cm MITRAL VALVE                TRICUSPID VALVE MV Area (PHT): 4.96 cm     TR Peak grad:   14.3 mmHg MV Peak grad:  11.2 mmHg    TR Vmax:        189.00 cm/s MV Mean grad:  3.0 mmHg MV Vmax:       1.67 m/s     SHUNTS MV Vmean:      80.3 cm/s    Systemic VTI:  0.15 m MV Decel Time: 153 msec     Systemic Diam: 1.80 cm MR Peak grad: 124.5 mmHg MR Mean grad: 80.0 mmHg MR Vmax:      558.00 cm/s MR Vmean:     425.0 cm/s MV E velocity: 142.00 cm/s MV A velocity: 84.60 cm/s MV E/A ratio:  1.68 Marca Ancona MD Electronically signed by Marca Ancona  MD Signature Date/Time: 04/19/2021/3:40:03 PM    Final    VAS [place] LOWER EXTREMITY VENOUS (DVT)  Result Date: 04/19/2021  Lower Venous DVT Study Patient Name:  Jasmin Miller  Date of Exam:   04/19/2021 Medical Rec #: [id number]       Accession #:    [id number] Date of Birth: 11/20/1941       Patient Gender: F Patient Age:   80Y Exam Location:  [facility] Procedure:      VAS [place] LOWER EXTREMITY VENOUS (DVT) Referring Phys: [id number] Jaylenn Altier  --------------------------------------------------------------------------------  Indications: Edema.  Comparison Study: No previous exam Performing Technologist: Ernestene Mention  Examination Guidelines: A complete evaluation includes B-mode imaging, spectral Doppler, color Doppler, and power Doppler as needed of all accessible portions of each vessel. Bilateral testing is considered an integral part of a complete examination. Limited examinations for reoccurring indications may be performed as noted. The reflux portion of the exam is performed with the patient in reverse Trendelenburg.  +---------+---------------+---------+-----------+----------+--------------+ RIGHT    CompressibilityPhasicitySpontaneityPropertiesThrombus Aging +---------+---------------+---------+-----------+----------+--------------+ CFV      Full           Yes      Yes                                 +---------+---------------+---------+-----------+----------+--------------+ SFJ      Full                                                        +---------+---------------+---------+-----------+----------+--------------+ FV Prox  Full           Yes      Yes                                 +---------+---------------+---------+-----------+----------+--------------+ FV Mid   Full           Yes      Yes                                 +---------+---------------+---------+-----------+----------+--------------+ FV DistalFull           Yes      Yes                                 +---------+---------------+---------+-----------+----------+--------------+ PFV      Full                                                        +---------+---------------+---------+-----------+----------+--------------+ POP      Full           Yes      Yes                                 +---------+---------------+---------+-----------+----------+--------------+ PTV      Full                                                         +---------+---------------+---------+-----------+----------+--------------+  PERO     Full                                                        +---------+---------------+---------+-----------+----------+--------------+   +---------+---------------+---------+-----------+----------+--------------+ LEFT     CompressibilityPhasicitySpontaneityPropertiesThrombus Aging +---------+---------------+---------+-----------+----------+--------------+ CFV      Full           Yes      Yes                                 +---------+---------------+---------+-----------+----------+--------------+ SFJ      Full                                                        +---------+---------------+---------+-----------+----------+--------------+ FV Prox  Full           Yes      Yes                                 +---------+---------------+---------+-----------+----------+--------------+ FV Mid   Full           Yes      Yes                                 +---------+---------------+---------+-----------+----------+--------------+ FV DistalFull           Yes      Yes                                 +---------+---------------+---------+-----------+----------+--------------+ PFV      Full                                                        +---------+---------------+---------+-----------+----------+--------------+ POP      Full           Yes      Yes                                 +---------+---------------+---------+-----------+----------+--------------+ PTV      Full                                                        +---------+---------------+---------+-----------+----------+--------------+ PERO     Full                                                        +---------+---------------+---------+-----------+----------+--------------+  Summary: BILATERAL: - No evidence of deep vein thrombosis seen in the lower extremities, bilaterally. - No  evidence of superficial venous thrombosis in the lower extremities, bilaterally. -No evidence of popliteal cyst, bilaterally.   *See table(s) above for measurements and observations. Electronically signed by Lemar Livings MD on 04/19/2021 at 4:58:20 PM.    Final         Scheduled Meds: . furosemide  20 mg Intravenous Once  . insulin aspart  0-9 Units Subcutaneous Q4H  . metoprolol succinate  25 mg Oral QHS  . pantoprazole (PROTONIX) IV  40 mg Intravenous Q12H  . potassium chloride  40 mEq Oral Once  . rosuvastatin  10 mg Oral Daily  . sodium chloride flush  3 mL Intravenous Q12H   Continuous Infusions: . ferumoxytol       LOS: 1 day   Time spent: Greater than 50% of this time was spent in counseling, explanation of diagnosis, planning of further management, and coordination of care.   Voice Recognition Reubin Milan dictation system was used to create this note, attempts have been made to correct errors. Please contact the author with questions and/or clarifications.   Albertine Grates, MD PhD FACP [facility]  Available via Epic secure chat 7am-7pm for nonurgent issues Please page for urgent issues To page the attending provider between 7A-7P or the covering provider during after hours 7P-7A, please log into the web site [email or web address] and access using universal  password for that web site. If you do not have the password, please call the hospital operator.    04/19/2021, 5:37 PM

## 2021-04-19 NOTE — Transfer of Care (Signed)
Immediate Anesthesia Transfer of Care Note  Patient: Jasmin Miller  Procedure(s) Performed: ESOPHAGOGASTRODUODENOSCOPY (EGD) (N/A )  Patient Location: PACU  Anesthesia Type:MAC  Level of Consciousness: awake and alert   Airway & Oxygen Therapy: Patient Spontanous Breathing and Patient connected to face mask oxygen  Post-op Assessment: Report given to RN, Post -op Vital signs reviewed and stable and Patient moving all extremities X 4  Post vital signs: Reviewed and stable  Last Vitals:  Vitals Value Taken Time  BP    Temp    Pulse    Resp    SpO2      Last Pain:  Vitals:   04/19/21 1117  TempSrc: Oral  PainSc: 8       Patients Stated Pain Goal: 2 (62/83/66 2947)  Complications: No complications documented.

## 2021-04-19 NOTE — Brief Op Note (Signed)
Mild gastritis and otherwise normal EGD. Advance diet as tolerated. No further GI procedures planned at this time. Will sign off. Call if questions. F/U with GI in 4-6 weeks. Dr. Ewing Schlein available to see this weekend if needed.

## 2021-04-19 NOTE — Plan of Care (Signed)
  Problem: Activity: Goal: Risk for activity intolerance will decrease Outcome: Progressing   

## 2021-04-19 NOTE — Progress Notes (Signed)
Pt returned from Endo to room 1518. VS stable. Pt resting comfortably.

## 2021-04-19 NOTE — Anesthesia Procedure Notes (Signed)
Procedure Name: MAC Date/Time: 04/19/2021 12:04 PM Performed by: Niel Hummer, CRNA Pre-anesthesia Checklist: Emergency Drugs available, Patient identified, Suction available and Patient being monitored Oxygen Delivery Method: Simple face mask

## 2021-04-19 NOTE — Interval H&P Note (Signed)
History and Physical Interval Note:  04/19/2021 12:02 PM  Jasmin Miller  has presented today for surgery, with the diagnosis of anemia, history of duodenal AVMs.  The various methods of treatment have been discussed with the patient and family. After consideration of risks, benefits and other options for treatment, the patient has consented to  Procedure(s): ESOPHAGOGASTRODUODENOSCOPY (EGD) (N/A) as a surgical intervention.  The patient's history has been reviewed, patient examined, no change in status, stable for surgery.  I have reviewed the patient's chart and labs.  Questions were answered to the patient's satisfaction.     Shirley Friar

## 2021-04-19 NOTE — Progress Notes (Signed)
  Echocardiogram 2D Echocardiogram has been attempted. Patient leaving for Endo procedure. Will reattempt at later time.  Jasmin Miller Jasmin Miller 04/19/2021, 10:56 AM

## 2021-04-19 NOTE — Progress Notes (Signed)
  Echocardiogram 2D Echocardiogram has been performed.  Jasmin Miller  [name] 04/19/2021, 3:30 PM

## 2021-04-19 NOTE — Anesthesia Postprocedure Evaluation (Signed)
Anesthesia Post Note  Patient: Jasmin Miller  Procedure(s) Performed: ESOPHAGOGASTRODUODENOSCOPY (EGD) (N/A )     Patient location during evaluation: Endoscopy Anesthesia Type: MAC Level of consciousness: awake Pain management: pain level controlled Vital Signs Assessment: post-procedure vital signs reviewed and stable Respiratory status: spontaneous breathing Cardiovascular status: stable Postop Assessment: no apparent nausea or vomiting Anesthetic complications: no   No complications documented.  Last Vitals:  Vitals:   04/19/21 1245 04/19/21 1307  BP: 131/60 (!) 141/61  Pulse: 93 89  Resp: (!) 32 20  Temp:  36.9 C  SpO2: 94% 97%    Last Pain:  Vitals:   04/19/21 1244  TempSrc:   PainSc: 0-No pain                 Travonna Swindle

## 2021-04-20 DIAGNOSIS — D649 Anemia, unspecified: Secondary | ICD-10-CM | POA: Diagnosis not present

## 2021-04-20 LAB — GLUCOSE, CAPILLARY
Glucose-Capillary: 142 mg/dL — ABNORMAL HIGH (ref 70–99)
Glucose-Capillary: 121 mg/dL — ABNORMAL HIGH (ref 70–99)
Glucose-Capillary: 154 mg/dL — ABNORMAL HIGH (ref 70–99)

## 2021-04-20 LAB — CBC
HCT: 26.2 % — ABNORMAL LOW (ref 36.0–46.0)
Hemoglobin: 7.9 g/dL — ABNORMAL LOW (ref 12.0–15.0)
MCH: 21.9 pg — ABNORMAL LOW (ref 26.0–34.0)
MCHC: 30.2 g/dL (ref 30.0–36.0)
MCV: 72.6 fL — ABNORMAL LOW (ref 80.0–100.0)
Platelets: 263 10*3/uL (ref 150–400)
RBC: 3.61 MIL/uL — ABNORMAL LOW (ref 3.87–5.11)
RDW: 26.4 % — ABNORMAL HIGH (ref 11.5–15.5)
WBC: 6.7 10*3/uL (ref 4.0–10.5)
nRBC: 0.6 % — ABNORMAL HIGH (ref 0.0–0.2)

## 2021-04-20 LAB — BASIC METABOLIC PANEL
Anion gap: 9 (ref 5–15)
BUN: 17 mg/dL (ref 8–23)
CO2: 23 mmol/L (ref 22–32)
Calcium: 9.1 mg/dL (ref 8.9–10.3)
Chloride: 106 mmol/L (ref 98–111)
Creatinine, Ser: 1.33 mg/dL — ABNORMAL HIGH (ref 0.44–1.00)
GFR, Estimated: 40 mL/min — ABNORMAL LOW (ref >60)
Glucose, Bld: 133 mg/dL — ABNORMAL HIGH (ref 70–99)
Potassium: 4.2 mmol/L (ref 3.5–5.1)
Sodium: 138 mmol/L (ref 135–145)

## 2021-04-20 LAB — POCT I-STAT, CHEM 8
BUN: 14 mg/dL (ref 8–23)
Calcium, Ion: 1.04 mmol/L — ABNORMAL LOW (ref 1.15–1.40)
Chloride: 106 mmol/L (ref 98–111)
Creatinine, Ser: 1.1 mg/dL — ABNORMAL HIGH (ref 0.44–1.00)
Glucose, Bld: 106 mg/dL — ABNORMAL HIGH (ref 70–99)
HCT: 22 % — ABNORMAL LOW (ref 36.0–46.0)
Hemoglobin: 7.5 g/dL — ABNORMAL LOW (ref 12.0–15.0)
Potassium: 3.5 mmol/L (ref 3.5–5.1)
Sodium: 138 mmol/L (ref 135–145)
TCO2: 25 mmol/L (ref 22–32)

## 2021-04-20 LAB — BRAIN NATRIURETIC PEPTIDE: B Natriuretic Peptide: 1112.2 pg/mL — ABNORMAL HIGH (ref 0.0–100.0)

## 2021-04-20 MED ORDER — POTASSIUM CHLORIDE ER 20 MEQ PO TBCR: 20.0000 meq | EXTENDED_RELEASE_TABLET | Freq: Every day | ORAL | 0 refills | Status: DC

## 2021-04-20 MED ORDER — PANTOPRAZOLE SODIUM 40 MG PO TBEC: 40.0000 mg | DELAYED_RELEASE_TABLET | Freq: Two times a day (BID) | ORAL | 0 refills | Status: DC

## 2021-04-20 MED ORDER — FUROSEMIDE 40 MG PO TABS: 40.0000 mg | ORAL_TABLET | Freq: Every day | ORAL | 0 refills | Status: DC

## 2021-04-20 MED ORDER — SENNOSIDES-DOCUSATE SODIUM 8.6-50 MG PO TABS: 1.0000 | ORAL_TABLET | Freq: Every day | ORAL | Status: DC

## 2021-04-20 MED ORDER — METOPROLOL SUCCINATE ER 100 MG PO TB24: 100.0000 mg | ORAL_TABLET | Freq: Every day | ORAL | 0 refills | Status: DC

## 2021-04-20 NOTE — Progress Notes (Signed)
Pt being discharged to home. Discharge instructions and medication education provided to pt.  

## 2021-04-20 NOTE — Discharge Summary (Signed)
Discharge Summary  Quida Glasser ZOX:[id number] DOB: Apr 28, 1941  PCP: Lindaann Pascal, PA-C  Admit date: 04/17/2021 Discharge date: 04/20/2021  Time spent:  Recommendations for Outpatient Follow-up:  1. F/u with PCP within a week  for hospital discharge follow up, repeat cbc/bmp at follow up 2. F/u with cardiology Dr Jacinto Halim 3. F/u with GI Dr Bosie Clos    Discharge Diagnoses:  Active Hospital Problems   Diagnosis Date Noted  . Symptomatic anemia 11/17/2018  . Hyperlipidemia associated with type 2 diabetes mellitus (HCC) 04/18/2021  . AKI (acute kidney injury) (HCC) 07/10/2020  . DM2 (diabetes mellitus, type 2) (HCC) 11/17/2018  . Hypertension associated with diabetes (HCC) 11/17/2018    Resolved Hospital Problems  No resolved problems to display.    Discharge Condition: stable  Diet recommendation: heart healthy/carb modified  Filed Weights   04/17/21 1957 04/17/21 2130 04/19/21 1117  Weight: 73.5 kg 73.5 kg 73.5 kg    History of present illness: ( per admitting MD Dr Allena Katz) Chief Complaint: Shortness of breath  HPI: Jasmin Miller is a 80 y.o. 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  Patient states she initially went to see her PCP on 5/11 for evaluation of exertional dyspnea and right hand pain.  She says she was noted to have an irregular heartbeat and was told that she had atrial fibrillation and subsequently recommended to come to the ED for further evaluation.  Patient states that she has new onset of exertional dyspnea over the last few days.  She has noticed some increased swelling in both of her lower extremities which she says occurs intermittently.  She has had a nonproductive cough.  She has not had any chest pain, nausea, vomiting, abdominal pain.  She has not seen  any rectal bleeding or dark black appearing stool.  She was last admitted in August 2021 for symptomatic anemia.  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  AVMs were considered the cause of her anemia due to slow chronic bleed.  Follow-up colonoscopy was recommended but does not appear to have been completed.  Patient states she is taking aspirin 81 mg daily.  ED Course:  Initial vitals showed BP 136/59, pulse 106, RR 18, temp 98.3 F, SPO2 100% on room air.  Labs show hemoglobin 4.2, hematocrit 15.8, MCV 64.5, RDW 19.2, platelets 297,000, WBC 6.9, sodium 133, potassium 3.4, bicarb 20, BUN 25, creatinine 1.49, serum glucose 114, high-sensitivity troponin I 59, BNP 463.0.  2 view chest x-ray is negative for focal consolidation, edema, or effusion.  Patient was ordered to receive 2 unit PRBC transfusion.  The hospitalist service was consulted to admit for further evaluation and management.  Hospital Course:  Principal Problem:   Symptomatic anemia Active Problems:   Hypertension associated with diabetes (HCC)   DM2 (diabetes mellitus, type 2) (HCC)   AKI (acute kidney injury) (HCC)   Hyperlipidemia associated with type 2 diabetes mellitus (HCC)   Acute on chronic microcytic anemia  -hgb 4.2 on presentation, -She received two units of prbc , hgb 7.9 at discahrge -B12 258, increased from 50 in 2019, folate 16.8 -very low iron saturation at 5%, she states has oral iron at home but does not take it due to it makes her constipated  -s/p  iv iron  x1 on 5/14 -GI input appreciated, per Dr Bosie Clos " Mild gastritis and otherwise normal EGD. Advance diet as tolerated. No further GI procedures planned at this time. Will sign off. Call if questions. 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   Multifocal atrial tachycardia -per daughter , she has afib last year while in the hospital -cardiology consulted , Dr Jacinto Halim reviewed tele strip recommend no afib, does not need anticoagulation, in addition, she is not a candidate for anticoagulation due to h/o AVM and significant anemia  -increased   toprol-xl to 100mg  qhs at home per cardiology recommendation  --cardiology Dr input appreciated  Hypokalemia Replaced, discharged on daily k Jacinto Halim since started her on daily lasix  Acute on chronic combined heart failure -presents with sob, bilateral lower extremity edema ( reports has been having intermittent edema for a while , it is nothing new to her, it will get better with leg elevation, initially she declined lasix, she reports was on lasix years ago, was taken off for unclear reason,  -she later agreed to trial dose of iv lasix -cxr no acute findings -venous doppler no DVT -cardiology Dr consulted , input appreciated, details please refer to original consult note on 5/13, discussed with Dr 6/13 on the phone who recommended lasix 40mg  daily , patient will follow up with Dr Jacinto Halim in 10 days  CAD /MI in the past  Denies chest pain Hold  asa 81mg  due to gastritis per cardiology recommendation Continue  creastor   AKI on CKDII, 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    Procedures:  EGD  Consultations:  [facility]  Cardiology Dr 10-28-1985  Discharge Exam: BP  111/65   Pulse 69   Temp 97.9 F (36.6 C)   Resp 18   Ht 5\' 1"  (1.549 m)   Wt 73.5 kg   SpO2 99%   BMI 30.62 kg/m   General: NAD Cardiovascular: irregular Respiratory: CTABL  Discharge Instructions You were cared for by a hospitalist during your hospital stay. If you have any questions about your discharge medications or the care you received while you were in the hospital after you are discharged, you can call the unit and asked to speak with the hospitalist on call if the hospitalist that took care of you is not available. Once you are discharged, your primary care physician will handle any further medical issues. Please note that NO REFILLS for any discharge medications will be authorized once you are discharged, as it is imperative that you return to your primary care physician (or establish a relationship with a primary care physician if you do not have one) for your aftercare needs so that they can reassess your need for medications and monitor your lab values.  Discharge Instructions    Diet - low sodium heart healthy   Complete by: As directed    Carb modified diet   Increase  activity slowly   Complete by: As directed      Allergies as of 04/20/2021   No Known Allergies     Medication List    STOP taking these medications   amLODipine 5 MG tablet Commonly known as: NORVASC   aspirin EC 81 MG tablet   lisinopril-hydrochlorothiazide 20-25 MG tablet Commonly known as: ZESTORETIC     TAKE these medications   furosemide 40 MG tablet Commonly known as: LASIX Take 1 tablet (40 mg total) by mouth daily. Start taking on: Apr 21, 2021   metFORMIN 500 MG 24 hr tablet Commonly known as: GLUCOPHAGE-XR Take 500 mg by mouth daily with breakfast.   metoprolol succinate 100 MG 24 hr tablet Commonly known as: TOPROL-XL Take 1 tablet (100 mg total) by mouth at bedtime. Take with or immediately following a meal. What changed:   medication strength  how much to  take  when to take this  additional instructions   pantoprazole 40 MG tablet Commonly known as: PROTONIX Take 1 tablet (40 mg total) by mouth 2 (two) times daily before a meal.   Potassium Chloride ER 20 MEQ Tbcr Take 20 mEq by mouth daily. Start taking on: Apr 21, 2021   rosuvastatin 10 MG tablet Commonly known as: CRESTOR Take 10 mg by mouth daily.   senna-docusate 8.6-50 MG tablet Commonly known as: Senokot-S Take 1 tablet by mouth at bedtime.   triamcinolone cream 0.1 % Commonly known as: KENALOG Apply 1 application topically every 8 (eight) hours as needed (itching).      No Known Allergies  Follow-up Information    Long, Scott, PA-C Follow up in 1 week(s).   Specialty: Physician Assistant Why: hospital discharge follow up, repeat basic labs cbc/bmp to monitor anemia and kidney function  Contact information: [address] [place] [address] [phone]        Yates Decamp, MD Follow up.   Specialty: Cardiology Why: for irregular heart beats and heart failrue management  right carotid bruit Contact information: [address] [place] [address] [phone]        Charlott Rakes, MD Follow up in 4 day(s).   Specialty: Gastroenterology Why: follow up on gastritis, h/o small bowel AVM iron deficiency anemia  Contact information: [address] [place] [address] [phone]                The results of significant diagnostics from this hospitalization (including imaging, microbiology, ancillary and laboratory) are listed below for reference.    Significant Diagnostic Studies: DG Chest 2 View  Result Date: 04/17/2021 CLINICAL DATA:  Shortness of breath EXAM: CHEST - 2 VIEW COMPARISON:  07/10/2020 FINDINGS: No consolidation or edema. No pleural effusion or pneumothorax. Cardiomediastinal contours are within normal limits. No acute osseous abnormality. IMPRESSION: No acute process in the chest.  Electronically Signed   By: Guadlupe Spanish M.D.   On: 04/17/2021 20:46   ECHOCARDIOGRAM COMPLETE  Result Date: 04/19/2021    ECHOCARDIOGRAM REPORT   Patient Name:   ELDORA NAPP Date of Exam: 04/19/2021 Medical Rec #:  [id number]      Height:       61.0 in Accession #:    [id number]     Weight:       162.0 lb Date of Birth:  1941/02/06      BSA:          1.727 m Patient Age:    35 years  BP:           141/61 mmHg Patient Gender: F              HR:           89 bpm. Exam Location:  Inpatient Procedure: 2D Echo, Cardiac Doppler and Color Doppler Indications:    Lower extremity edema  History:        Patient has no prior history of Echocardiogram examinations.                 Previous Myocardial Infarction; Risk Factors:Hypertension and                 Diabetes.  Sonographer:    Shirlean Kelly Referring Phys: [id number] Terriann Difonzo IMPRESSIONS  1. Left ventricular ejection fraction, by estimation, is 50%. The left ventricle has mildly decreased function. The left ventricle demonstrates regional wall motion abnormalities with severe hypokinesis of the basal inferior and basal inferolateral walls, hypokinesis of the basal anterolateral wall. Left ventricular diastolic parameters are consistent with Grade II diastolic dysfunction (pseudonormalization).  2. Right ventricular systolic function is mildly reduced. The right ventricular size is normal. There is normal pulmonary artery systolic pressure. The estimated right ventricular systolic pressure is 29.3 mmHg.  3. Left atrial size was severely dilated.  4. Right atrial size was mildly dilated.  5. The mitral valve is abnormal. At least moderate to possibly severe mitral valve regurgitation. This may not be fully visualized. Possible infarct-related mitral regurgitation given regional wall motion abnormalities. No evidence of mitral stenosis.  6. The aortic valve is tricuspid. Aortic valve regurgitation is not visualized. Mild aortic valve sclerosis is present, with  no evidence of aortic valve stenosis.  7. The inferior vena cava is dilated in size with <50% respiratory variability, suggesting right atrial pressure of 15 mmHg. FINDINGS  Left Ventricle: Left ventricular ejection fraction, by estimation, is 50%. The left ventricle has mildly decreased function. The left ventricle demonstrates regional wall motion abnormalities. The left ventricular internal cavity size was normal in size. There is no left ventricular hypertrophy. Left ventricular diastolic parameters are consistent with Grade II diastolic dysfunction (pseudonormalization). Right Ventricle: The right ventricular size is normal. No increase in right ventricular wall thickness. Right ventricular systolic function is mildly reduced. There is normal pulmonary artery systolic pressure. The tricuspid regurgitant velocity is 1.89 m/s, and with an assumed right atrial pressure of 15 mmHg, the estimated right ventricular systolic pressure is 29.3 mmHg. Left Atrium: Left atrial size was severely dilated. Right Atrium: Right atrial size was mildly dilated. Pericardium: There is no evidence of pericardial effusion. Mitral Valve: The mitral valve is abnormal. There is mild calcification of the mitral valve leaflet(s). Mild mitral annular calcification. Moderate to severe mitral valve regurgitation. No evidence of mitral valve stenosis. MV peak gradient, 11.2 mmHg. The mean mitral valve gradient is 3.0 mmHg. Tricuspid Valve: The tricuspid valve is normal in structure. Tricuspid valve regurgitation is trivial. Aortic Valve: The aortic valve is tricuspid. Aortic valve regurgitation is not visualized. Mild aortic valve sclerosis is present, with no evidence of aortic valve stenosis. Aortic valve mean gradient measures 6.0 mmHg. Aortic valve peak gradient measures 13.8 mmHg. Aortic valve area, by VTI measures 1.23 cm. Pulmonic Valve: The pulmonic valve was normal in structure. Pulmonic valve regurgitation is trivial. Aorta: The  aortic root is normal in size and structure. Venous: The inferior vena cava is dilated in size with less than 50% respiratory variability, suggesting right atrial  pressure of 15 mmHg. IAS/Shunts: No atrial level shunt detected by color flow Doppler.  LEFT VENTRICLE PLAX 2D LVIDd:         5.14 cm  Diastology LVIDs:         3.80 cm  LV e' medial:    5.00 cm/s LV PW:         1.09 cm  LV E/e' medial:  28.4 LV IVS:        1.04 cm  LV e' lateral:   9.25 cm/s LVOT diam:     1.80 cm  LV E/e' lateral: 15.4 LV SV:         38 LV SV Index:   22 LVOT Area:     2.54 cm  RIGHT VENTRICLE            IVC RV Basal diam:  4.32 cm    IVC diam: 2.41 cm RV S prime:     9.79 cm/s LEFT ATRIUM              Index       RIGHT ATRIUM           Index LA diam:        4.70 cm  2.72 cm/m  RA Area:     20.20 cm LA Vol (A2C):   100.0 ml 57.91 ml/m RA Volume:   58.40 ml  33.82 ml/m LA Vol (A4C):   98.3 ml  56.92 ml/m LA Biplane Vol: 99.8 ml  57.79 ml/m  AORTIC VALVE AV Area (Vmax):    1.28 cm AV Area (Vmean):   1.44 cm AV Area (VTI):     1.23 cm AV Vmax:           186.00 cm/s AV Vmean:          115.000 cm/s AV VTI:            0.312 m AV Peak Grad:      13.8 mmHg AV Mean Grad:      6.0 mmHg LVOT Vmax:         93.30 cm/s LVOT Vmean:        64.900 cm/s LVOT VTI:          0.151 m LVOT/AV VTI ratio: 0.48  AORTA Ao Root diam: 2.70 cm MITRAL VALVE                TRICUSPID VALVE MV Area (PHT): 4.96 cm     TR Peak grad:   14.3 mmHg MV Peak grad:  11.2 mmHg    TR Vmax:        189.00 cm/s MV Mean grad:  3.0 mmHg MV Vmax:       1.67 m/s     SHUNTS MV Vmean:      80.3 cm/s    Systemic VTI:  0.15 m MV Decel Time: 153 msec     Systemic Diam: 1.80 cm MR Peak grad: 124.5 mmHg MR Mean grad: 80.0 mmHg MR Vmax:      558.00 cm/s MR Vmean:     425.0 cm/s MV E velocity: 142.00 cm/s MV A velocity: 84.60 cm/s MV E/A ratio:  1.68 Marca Ancona MD Electronically signed by Marca Ancona MD Signature Date/Time: 04/19/2021/3:40:03 PM    Final    VAS [place] LOWER EXTREMITY  VENOUS (DVT)  Result Date: 04/19/2021  Lower Venous DVT Study Patient Name:  Gyneth Hubka  Date of Exam:   04/19/2021 Medical Rec #: [id number]  Accession #:    [id number] Date of Birth: October 07, 1941       Patient Gender: F Patient Age:   80Y Exam Location:  [facility] Procedure:      VAS [place] LOWER EXTREMITY VENOUS (DVT) Referring Phys: [id number] Arnecia Ector --------------------------------------------------------------------------------  Indications: Edema.  Comparison Study: No previous exam Performing Technologist: Ernestene Mention  Examination Guidelines: A complete evaluation includes B-mode imaging, spectral Doppler, color Doppler, and power Doppler as needed of all accessible portions of each vessel. Bilateral testing is considered an integral part of a complete examination. Limited examinations for reoccurring indications may be performed as noted. The reflux portion of the exam is performed with the patient in reverse Trendelenburg.  +---------+---------------+---------+-----------+----------+--------------+ RIGHT    CompressibilityPhasicitySpontaneityPropertiesThrombus Aging +---------+---------------+---------+-----------+----------+--------------+ CFV      Full           Yes      Yes                                 +---------+---------------+---------+-----------+----------+--------------+ SFJ      Full                                                        +---------+---------------+---------+-----------+----------+--------------+ FV Prox  Full           Yes      Yes                                 +---------+---------------+---------+-----------+----------+--------------+ FV Mid   Full           Yes      Yes                                 +---------+---------------+---------+-----------+----------+--------------+ FV DistalFull           Yes      Yes                                 +---------+---------------+---------+-----------+----------+--------------+  PFV      Full                                                        +---------+---------------+---------+-----------+----------+--------------+ POP      Full           Yes      Yes                                 +---------+---------------+---------+-----------+----------+--------------+ PTV      Full                                                        +---------+---------------+---------+-----------+----------+--------------+ PERO  Full                                                        +---------+---------------+---------+-----------+----------+--------------+   +---------+---------------+---------+-----------+----------+--------------+ LEFT     CompressibilityPhasicitySpontaneityPropertiesThrombus Aging +---------+---------------+---------+-----------+----------+--------------+ CFV      Full           Yes      Yes                                 +---------+---------------+---------+-----------+----------+--------------+ SFJ      Full                                                        +---------+---------------+---------+-----------+----------+--------------+ FV Prox  Full           Yes      Yes                                 +---------+---------------+---------+-----------+----------+--------------+ FV Mid   Full           Yes      Yes                                 +---------+---------------+---------+-----------+----------+--------------+ FV DistalFull           Yes      Yes                                 +---------+---------------+---------+-----------+----------+--------------+ PFV      Full                                                        +---------+---------------+---------+-----------+----------+--------------+ POP      Full           Yes      Yes                                 +---------+---------------+---------+-----------+----------+--------------+ PTV      Full                                                         +---------+---------------+---------+-----------+----------+--------------+ PERO     Full                                                        +---------+---------------+---------+-----------+----------+--------------+     Summary:  BILATERAL: - No evidence of deep vein thrombosis seen in the lower extremities, bilaterally. - No evidence of superficial venous thrombosis in the lower extremities, bilaterally. -No evidence of popliteal cyst, bilaterally.   *See table(s) above for measurements and observations. Electronically signed by Lemar Livings MD on 04/19/2021 at 4:58:20 PM.    Final     Microbiology: Recent Results (from the past 240 hour(s))  Resp Panel by RT-PCR (Flu A&B, Covid) Nasopharyngeal Swab     Status: None   Collection Time: 04/18/21 12:58 AM   Specimen: Nasopharyngeal Swab; Nasopharyngeal(NP) swabs in vial transport medium  Result Value Ref Range Status   SARS Coronavirus 2 by RT PCR NEGATIVE NEGATIVE Final    Comment: (NOTE) SARS-CoV-2 target nucleic acids are NOT DETECTED.  The SARS-CoV-2 RNA is generally detectable in upper respiratory specimens during the acute phase of infection. The lowest concentration of SARS-CoV-2 viral copies this assay can detect is 138 copies/mL. A negative result does not preclude SARS-Cov-2 infection and should not be used as the sole basis for treatment or other patient management decisions. A negative result may occur with  improper specimen collection/handling, submission of specimen other than nasopharyngeal swab, presence of viral mutation(s) within the areas targeted by this assay, and inadequate number of viral copies(<138 copies/mL). A negative result must be combined with clinical observations, patient history, and epidemiological information. The expected result is Negative.  Fact Sheet for Patients:  BloggerCourse.com  Fact Sheet for Healthcare Providers:   SeriousBroker.it  This test is no t yet approved or cleared by the [place] FDA and  has been authorized for detection and/or diagnosis of SARS-CoV-2 by FDA under an Emergency Use Authorization (EUA). This EUA will remain  in effect (meaning this test can be used) for the duration of the COVID-19 declaration under Section 564(b)(1) of the Act, 21 U.S.C.section 360bbb-3(b)(1), unless the authorization is terminated  or revoked sooner.       Influenza A by PCR NEGATIVE NEGATIVE Final   Influenza B by PCR NEGATIVE NEGATIVE Final    Comment: (NOTE) The Xpert Xpress SARS-CoV-2/FLU/RSV plus assay is intended as an aid in the diagnosis of influenza from Nasopharyngeal swab specimens and should not be used as a sole basis for treatment. Nasal washings and aspirates are unacceptable for Xpert Xpress SARS-CoV-2/FLU/RSV testing.  Fact Sheet for Patients: BloggerCourse.com  Fact Sheet for Healthcare Providers: SeriousBroker.it  This test is not yet approved or cleared by the [place] FDA and has been authorized for detection and/or diagnosis of SARS-CoV-2 by FDA under an Emergency Use Authorization (EUA). This EUA will remain in effect (meaning this test can be used) for the duration of the COVID-19 declaration under Section 564(b)(1) of the Act, 21 U.S.C. section 360bbb-3(b)(1), unless the authorization is terminated or revoked.  Performed at [facility], [address], [place], [place] [address]      Labs: Basic Metabolic Panel: Recent Labs  Lab 04/17/21 2210 04/18/21 0605 04/19/21 0736 04/19/21 1149 04/20/21 0532  NA 133* 137 138 138 138  K 3.4* 3.6 3.4* 3.5 4.2  CL 103 105 105 106 106  CO2 20* 25 23  --  23  GLUCOSE 114* 127* 109* 106* 133*  BUN 25* 24* 19 14 17   CREATININE 1.49* 1.41* 1.26* 1.10* 1.33*  CALCIUM 8.7* 8.8* 8.9  --  9.1  MG  --   --  2.2  --   --     Liver Function Tests: Recent Labs  Lab  04/19/21 0736  AST 34  ALT 39  ALKPHOS 112  BILITOT 1.0  PROT 6.2*  ALBUMIN 3.1*   No results for input(s): LIPASE, AMYLASE in the last 168 hours. No results for input(s): AMMONIA in the last 168 hours. CBC: Recent Labs  Lab 04/17/21 2210 04/18/21 0605 04/18/21 1330 04/18/21 1522 04/18/21 2306 04/19/21 1149 04/20/21 0532  WBC 6.9 5.6  --   --   --   --  6.7  NEUTROABS 4.5  --   --   --   --   --   --   HGB 4.2* 4.2* 6.8* 7.1* 7.1* 7.5* 7.9*  HCT 15.8* 15.4* 22.4* 23.1* 22.9* 22.0* 26.2*  MCV 64.5* 64.4*  --   --   --   --  72.6*  PLT 297 280  --   --   --   --  263   Cardiac Enzymes: No results for input(s): CKTOTAL, CKMB, CKMBINDEX, TROPONINI in the last 168 hours. BNP: BNP (last 3 results) Recent Labs    04/17/21 2210 04/19/21 0736 04/20/21 0532  BNP 463.0* 994.6* 1,112.2*    ProBNP (last 3 results) No results for input(s): PROBNP in the last 8760 hours.  CBG: Recent Labs  Lab 04/19/21 2107 04/19/21 2355 04/20/21 0459 04/20/21 0816 04/20/21 1203  GLUCAP 120* 118* 142* 121* 154*       Signed:  Albertine Grates MD, PhD, FACP  [facility] 04/20/2021, 2:08 PM

## 2021-04-22 ENCOUNTER — Encounter (HOSPITAL_COMMUNITY): Payer: Self-pay | Admitting: Gastroenterology

## 2021-08-22 NOTE — Progress Notes (Signed)
Primary Physician/Referring:  Lindaann Pascal, PA-C  Patient ID: Jasmin Miller, female    DOB: Apr 27, 1941, 80 y.o.   MRN: [id number]  Chief Complaint  Patient presents with   Congestive Heart Failure   Shortness of Breath   HPI:    Jasmin Miller  is a 80 y.o. 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  Patient reportedly had stress test and subsequent cardiac catheterization remotely in [place], however results are unknown.  Patient reports no history of stenting to coronary arteries.   Patient was last seen in our office by Dr. Rosemary Holms 2020, unfortunately she was lost to follow-up.  Patient then admitted 04/18/2021 - 04/20/2021 with worsening dyspnea and fatigue, she was found to be severely anemic.  At that time our office was consulted to manage acute diastolic heart failure.  Also during the stay patient had episodes of multifocal atrial tachycardia, no anticoagulation recommended at this time due to severe anemia and history of AVM.  At discharge she was recommended for follow-up with our office in 10 days, however she was lost to follow-up.  Patient was then seen by her PCP 08/15/2021 with worsening shortness of breath and chest x-ray concerning for mild pulmonary edema.  He now presents for further evaluation and management.  Patient is tolerating addition of valsartan 80 mg daily by PCP without issue.  However she has been without metoprolol for the last 1 week.  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  Over the last several weeks patient developed worsening shortness of breath which prompted her to be evaluated by PCP.  Patient states shortness of breath has significantly improved since initiation of valsartan.  She is also taking Lasix 40 mg once daily.  Patient also has bilateral lower leg edema which is improved  since visit at PCPs office.  Denies chest pain, palpitations, syncope, near syncope, dizziness.  Denies orthopnea or PND.  Past Medical History:  Diagnosis Date   DM2 (diabetes mellitus, type 2) (HCC)    Hypertension    MI (myocardial infarction) (HCC)    Symptomatic anemia    11/2018   Past Surgical History:  Procedure Laterality Date   ABDOMINAL HYSTERECTOMY     BIOPSY  07/12/2020   Procedure: BIOPSY;  Surgeon: Charlott Rakes, MD;  Location: [facility] ENDOSCOPY;  Service: Endoscopy;;   COLONOSCOPY  07/12/2020   COLONOSCOPY WITH PROPOFOL N/A 07/12/2020   Procedure: COLONOSCOPY WITH PROPOFOL;  Surgeon: Charlott Rakes, MD;  Location: [facility] ENDOSCOPY;  Service: Endoscopy;  Laterality: N/A;   ESOPHAGOGASTRODUODENOSCOPY  07/12/2020   ESOPHAGOGASTRODUODENOSCOPY N/A 07/12/2020   Procedure: ESOPHAGOGASTRODUODENOSCOPY (EGD);  Surgeon: Charlott Rakes, MD;  Location: [facility] ENDOSCOPY;  Service: Endoscopy;  Laterality: N/A;   ESOPHAGOGASTRODUODENOSCOPY N/A 04/19/2021   Procedure: ESOPHAGOGASTRODUODENOSCOPY (EGD);  Surgeon: Charlott Rakes, MD;  Location: [facility] ENDOSCOPY;  Service: Endoscopy;  Laterality: N/A;   HOT HEMOSTASIS N/A 07/12/2020   Procedure: HOT HEMOSTASIS (ARGON PLASMA COAGULATION/BICAP);  Surgeon: Charlott Rakes, MD;  Location: [facility] ENDOSCOPY;  Service: Endoscopy;  Laterality: N/A;  EGD and COLON   Family History  Problem Relation Age of Onset   Anemia Mother    Stroke Mother    Anemia Daughter     Social History   Tobacco Use   Smoking status: Former    Packs/day: 1.00    Years: 29.00    Pack years: 29.00    Types: Cigarettes  Quit date: 2    Years since quitting: 29.7   Smokeless tobacco: Never   Tobacco comments:    SMOKED FOR 29 YEARS   Substance Use Topics   Alcohol use: No   Marital Status: Widowed   ROS  Review of Systems  Constitutional: Negative for malaise/fatigue and weight gain.  Cardiovascular:  Positive for dyspnea on exertion (improving) and leg  swelling (bilateral). Negative for chest pain, claudication, near-syncope, orthopnea, palpitations, paroxysmal nocturnal dyspnea and syncope.  Neurological:  Negative for dizziness.   Objective  Blood pressure (!) 156/73, pulse 96, temperature 97.7 F (36.5 C), height 5\' 1"  (1.549 m), weight 145 lb (65.8 kg), SpO2 97 %.  Vitals with BMI 08/23/2021 08/23/2021 04/20/2021  Height - 5\' 1"  -  Weight - 145 lbs -  BMI - 27.41 -  Systolic 156 158 04/22/2021  Diastolic 73 68 65  Pulse 96 83 69      Physical Exam Vitals reviewed.  HENT:     Head: Normocephalic and atraumatic.  Neck:     Vascular: JVD present.  Cardiovascular:     Rate and Rhythm: Normal rate and regular rhythm.     Pulses: Intact distal pulses.     Heart sounds: S1 normal and S2 normal. Murmur heard.  Midsystolic murmur is present with a grade of 2/6 at the apex.    No gallop.  Pulmonary:     Effort: Pulmonary effort is normal. No respiratory distress.     Breath sounds: No wheezing, rhonchi or rales.  Musculoskeletal:     Right lower leg: Edema (minimal) present.     Left lower leg: Edema (minimal) present.  Neurological:     Mental Status: She is alert.    Laboratory examination:   Recent Labs    04/18/21 0605 04/19/21 0736 04/19/21 1149 04/20/21 0532  NA 137 138 138 138  K 3.6 3.4* 3.5 4.2  CL 105 105 106 106  CO2 25 23  --  23  GLUCOSE 127* 109* 106* 133*  BUN 24* 19 14 17   CREATININE 1.41* 1.26* 1.10* 1.33*  CALCIUM 8.8* 8.9  --  9.1  GFRNONAA 38* 43*  --  40*   CrCl cannot be calculated (Patient's most recent lab result is older than the maximum 21 days allowed.).  CMP Latest Ref Rng & Units 04/20/2021 04/19/2021 04/19/2021  Glucose 70 - 99 mg/dL 04/22/2021) 04/21/2021) 04/21/2021)  BUN 8 - 23 mg/dL 17 14 19   Creatinine 0.44 - 1.00 mg/dL 086(V) 784(O) 962(X)  Sodium 135 - 145 mmol/L 138 138 138  Potassium 3.5 - 5.1 mmol/L 4.2 3.5 3.4(L)  Chloride 98 - 111 mmol/L 106 106 105  CO2 22 - 32 mmol/L 23 - 23  Calcium 8.9  - 10.3 mg/dL 9.1 - 8.9  Total Protein 6.5 - 8.1 g/dL - - 6.2(L)  Total Bilirubin 0.3 - 1.2 mg/dL - - 1.0  Alkaline Phos 38 - 126 U/L - - 112  AST 15 - 41 U/L - - 34  ALT 0 - 44 U/L - - 39   CBC Latest Ref Rng & Units 04/20/2021 04/19/2021 04/18/2021  WBC 4.0 - 10.5 K/uL 6.7 - -  Hemoglobin 12.0 - 15.0 g/dL 7.9(L) 7.5(L) 7.1(L)  Hematocrit 36.0 - 46.0 % 26.2(L) 22.0(L) 22.9(L)  Platelets 150 - 400 K/uL 263 - -    Lipid Panel No results for input(s): CHOL, TRIG, LDLCALC, VLDL, HDL, CHOLHDL, LDLDIRECT in the last 8760 hours.  HEMOGLOBIN A1C Lab Results  Component  Value Date   HGBA1C 6.3 (H) 04/18/2021   MPG 134 04/18/2021   TSH No results for input(s): TSH in the last 8760 hours.  External labs:   08/15/2021: Hemoglobin 8.3, hematocrit 29, MCV 79.9, platelet 292 Glucose 105, BUN 13, creatinine 0.87, GFR 67, sodium 140, potassium 3.5, BNP 452  Allergies  No Known Allergies   Medications Prior to Visit:   Outpatient Medications Prior to Visit  Medication Sig Dispense Refill   Ferrous Sulfate Dried (HIGH POTENCY IRON) 65 MG TABS Fusion Plus 130 mg iron-1,250 mcg capsule  Take 1 capsule every day by oral route.     metFORMIN (GLUCOPHAGE-XR) 500 MG 24 hr tablet Take 500 mg by mouth daily with breakfast.     pantoprazole (PROTONIX) 40 MG tablet Take 1 tablet (40 mg total) by mouth 2 (two) times daily before a meal. 60 tablet 0   triamcinolone cream (KENALOG) 0.1 % Apply 1 application topically every 8 (eight) hours as needed (itching).      valsartan (DIOVAN) 80 MG tablet Take 1 tablet by mouth daily.     furosemide (LASIX) 40 MG tablet Take 1 tablet (40 mg total) by mouth daily. 30 tablet 0   metoprolol succinate (TOPROL-XL) 100 MG 24 hr tablet Take 1 tablet (100 mg total) by mouth at bedtime. Take with or immediately following a meal. 30 tablet 0   potassium chloride 20 MEQ TBCR Take 20 mEq by mouth daily. 30 tablet 0   rosuvastatin (CRESTOR) 10 MG tablet Take 10 mg by mouth  daily.     senna-docusate (SENOKOT-S) 8.6-50 MG tablet Take 1 tablet by mouth at bedtime. 30 tablet    No facility-administered medications prior to visit.   Final Medications at End of Visit    Current Meds  Medication Sig   Ferrous Sulfate Dried (HIGH POTENCY IRON) 65 MG TABS Fusion Plus 130 mg iron-1,250 mcg capsule  Take 1 capsule every day by oral route.   metFORMIN (GLUCOPHAGE-XR) 500 MG 24 hr tablet Take 500 mg by mouth daily with breakfast.   pantoprazole (PROTONIX) 40 MG tablet Take 1 tablet (40 mg total) by mouth 2 (two) times daily before a meal.   triamcinolone cream (KENALOG) 0.1 % Apply 1 application topically every 8 (eight) hours as needed (itching).    valsartan (DIOVAN) 80 MG tablet Take 1 tablet by mouth daily.   [DISCONTINUED] furosemide (LASIX) 40 MG tablet Take 1 tablet (40 mg total) by mouth daily.   [DISCONTINUED] metoprolol succinate (TOPROL-XL) 100 MG 24 hr tablet Take 1 tablet (100 mg total) by mouth at bedtime. Take with or immediately following a meal.   [DISCONTINUED] potassium chloride 20 MEQ TBCR Take 20 mEq by mouth daily.   [DISCONTINUED] rosuvastatin (CRESTOR) 10 MG tablet Take 10 mg by mouth daily.   Radiology:   No results found.  Cardiac Studies:   Echocardiogram 04/19/2021:   1. Left ventricular ejection fraction, by estimation, is 50%. The left ventricle has mildly decreased function. The left ventricle demonstrates regional wall motion abnormalities with severe hypokinesis of the basal inferior and basal inferolateral  walls, hypokinesis of the basal anterolateral wall. Left ventricular diastolic parameters are consistent with Grade II diastolic dysfunction (pseudonormalization).  2. Right ventricular systolic function is mildly reduced. The right ventricular size is normal. There is normal pulmonary artery systolic pressure. The estimated right ventricular systolic pressure is 29.3 mmHg.  3. Left atrial size was severely dilated.  4. Right  atrial size was mildly dilated.  5. The mitral valve is abnormal. At least moderate to possibly severe mitral valve regurgitation. This may not be fully visualized. Possible infarct-related mitral regurgitation given regional wall motion abnormalities. No evidence of mitral stenosis.  6. The aortic valve is tricuspid. Aortic valve regurgitation is not visualized. Mild aortic valve sclerosis is present, with no evidence of aortic valve stenosis.  7. The inferior vena cava is dilated in size with <50% respiratory variability, suggesting right atrial pressure of 15 mmHg.   Lower Extremity Venous Duplex  04/19/2021: BILATERAL:  - No evidence of deep vein thrombosis seen in the lower extremities,  bilaterally.  - No evidence of superficial venous thrombosis in the lower extremities,  bilaterally.  -No evidence of popliteal cyst, bilaterally.   EKG:   08/23/2021: Wandering atrial pacemaker.  Episodes of junctional escape (4).  Frequent PVCs (3).  At a rate of 101 bpm.  Normal axis.  Nonspecific T wave abnormality.  04/17/2021: Sinus versus ectopic atrial rhythm at rate of 90 bpm, normal axis.  Poor R wave progression, cannot exclude anteroseptal infarct old, nonspecific T abnormality.  Assessment     ICD-10-CM   1. Acute on chronic combined systolic and diastolic CHF (congestive heart failure) (HCC)  I50.43 EKG 12-Lead    Basic metabolic panel    Brain natriuretic peptide    2. 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 Essential hypertension  I10     4. Hypercholesterolemia  E78.00        Medications Discontinued During This Encounter  Medication Reason   senna-docusate (SENOKOT-S) 8.6-50 MG tablet Error   rosuvastatin (CRESTOR) 10 MG tablet Reorder   metoprolol succinate (TOPROL-XL) 100 MG 24 hr tablet Reorder   furosemide (LASIX) 40 MG tablet Reorder   potassium chloride 20 MEQ TBCR Reorder    Meds ordered this encounter  Medications   metoprolol succinate (TOPROL-XL) 100 MG 24  hr tablet    Sig: Take 1 tablet (100 mg total) by mouth at bedtime. Take with or immediately following a meal.    Dispense:  90 tablet    Refill:  3   furosemide (LASIX) 40 MG tablet    Sig: Take 1 tablet (40 mg total) by mouth daily.    Dispense:  90 tablet    Refill:  3   Potassium Chloride ER 20 MEQ TBCR    Sig: Take 20 mEq by mouth daily.    Dispense:  90 tablet    Refill:  3   rosuvastatin (CRESTOR) 10 MG tablet    Sig: Take 1 tablet (10 mg total) by mouth daily.    Dispense:  90 tablet    Refill:  3    Recommendations:   Jasmin Miller is a 80 y.o. 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  Patient reportedly had stress test and subsequent cardiac catheterization remotely in [place], however results are unknown.  Patient reports no history of stenting to coronary arteries.   Patient was last seen in our office by Dr. Rosemary Holms 2020, unfortunately she was lost to follow-up.  Patient then admitted 04/18/2021 - 04/20/2021 with worsening dyspnea and fatigue, she was found to be severely anemic.  At that time our office was consulted to manage acute diastolic heart failure.  Also during the stay patient had episodes of multifocal atrial tachycardia, no anticoagulation recommended at this time due to severe anemia and history  of AVM.  At discharge she was recommended for follow-up with our office in 10 days, however she was lost to follow-up.  Patient was then seen by her PCP 08/15/2021 with worsening shortness of breath and chest x-ray concerning for mild pulmonary edema.  He now presents for further evaluation and management.  Patient symptoms of dyspnea on exertion leg swelling have improved with addition of valsartan.  Unfortunately she has been without her metoprolol for the last 1 week, and blood pressure and heart rate are elevated in the  office today.  We will resume metoprolol succinate 100 mg daily.  Patient does continue to have bilateral leg edema and JVD, will therefore increase Lasix to 40 mg twice daily for the next 3 days, then return to taking 40 mg once daily.  We will repeat BMP and BNP in 1 week at PCPs office as patient is currently scheduled for follow-up.  Could consider switching patient from valsartan to [facility] in the future, however will hold off for now.  We will first reinitiate beta-blocker therapy.  We will plan to discuss with PCP addition of SGLT2 inhibitor, which I would recommend if patient will tolerate given renal function hemodynamics at next visit with PCP next week.  We will also consider addition of spironolactone in the future.   Follow-up in 2 weeks, sooner if needed, for HFpEF, hypertension, wandering atrial pacemaker.    This was a 45-minute encounter with face-to-face counseling, medical records review, coordination of care, explanation of complex medical issues, complex medical decision making, explaining pathophysiology of HFpEF and wandering atrial pacemaker.     Rayford Halsted, PA-C 08/23/2021, 12:24 PM Office: [phone]

## 2021-08-23 ENCOUNTER — Other Ambulatory Visit: Payer: Self-pay

## 2021-08-23 ENCOUNTER — Ambulatory Visit: Payer: Medicare Other | Admitting: Student

## 2021-08-23 ENCOUNTER — Encounter: Payer: Self-pay | Admitting: Student

## 2021-08-23 VITALS — BP 156/73 | HR 96 | Temp 97.7°F | Ht 61.0 in | Wt 145.0 lb

## 2021-08-23 DIAGNOSIS — E7800 Pure hypercholesterolemia, unspecified: Secondary | ICD-10-CM

## 2021-08-23 DIAGNOSIS — I10 Essential (primary) hypertension: Secondary | ICD-10-CM

## 2021-08-23 DIAGNOSIS — I5043 Acute on chronic combined systolic (congestive) and diastolic (congestive) heart failure: Secondary | ICD-10-CM

## 2021-08-23 DIAGNOSIS — I498 Other specified cardiac arrhythmias: Secondary | ICD-10-CM

## 2021-08-23 MED ORDER — METOPROLOL SUCCINATE ER 100 MG PO TB24: 100.0000 mg | ORAL_TABLET | Freq: Every day | ORAL | 3 refills | Status: DC

## 2021-08-23 MED ORDER — POTASSIUM CHLORIDE ER 20 MEQ PO TBCR: 20.0000 meq | EXTENDED_RELEASE_TABLET | Freq: Every day | ORAL | 3 refills | Status: DC

## 2021-08-23 MED ORDER — ROSUVASTATIN CALCIUM 10 MG PO TABS: 10.0000 mg | ORAL_TABLET | Freq: Every day | ORAL | 3 refills | Status: DC

## 2021-08-23 MED ORDER — FUROSEMIDE 40 MG PO TABS: 40.0000 mg | ORAL_TABLET | Freq: Every day | ORAL | 3 refills | Status: DC

## 2021-08-26 ENCOUNTER — Telehealth: Payer: Self-pay | Admitting: Student

## 2021-08-26 NOTE — Telephone Encounter (Signed)
This was sent to [facility]

## 2021-08-26 NOTE — Telephone Encounter (Signed)
Pt says Friday 9/16 Celeste was going to put in order for lasix, but the pharmacy did not have anything. Wondering if that can be sent through again.

## 2021-09-03 NOTE — Progress Notes (Deleted)
Primary Physician/Referring:  Lindaann Pascal, PA-C  Patient ID: Jasmin Miller, female    DOB: May 25, 1941, 80 y.o.   MRN: [id number]  No chief complaint on file.  HPI:    Jasmin Miller  is a 80 y.o. 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  Patient reportedly had stress test and subsequent cardiac catheterization remotely in [place], however results are unknown.  Patient reports no history of stenting to coronary arteries.   Admitted 04/18/2021 - 04/20/2021 with severe anemia and subsequently developed episodes of multifocal atrial tachycardia during admission.  Patient presented to our office 08/23/2021 for follow-up.  At last office visit patient had been without metoprolol for 1 week and therefore blood pressure and heart rate were elevated.  Resumed metoprolol succinate 100 mg daily and increased Lasix to 40 mg twice daily for 3 days given clinical volume overload. She now present for 2-week follow up. ***  ***How much Lasix? No BMP or BNP done?   Patient was last seen in our office by Dr. Rosemary Holms 2020, unfortunately she was lost to follow-up.  Patient then admitted 04/18/2021 - 04/20/2021 with worsening dyspnea and fatigue, she was found to be severely anemic.  At that time our office was consulted to manage acute diastolic heart failure.  Also during the stay patient had episodes of multifocal atrial tachycardia, no anticoagulation recommended at this time due to severe anemia and history of AVM.  At discharge she was recommended for follow-up with our office in 10 days, however she was lost to follow-up.  Patient was then seen by her PCP 08/15/2021 with worsening shortness of breath and chest x-ray concerning for mild pulmonary edema.  He now presents for further evaluation and management.  Patient is tolerating addition of valsartan 80 mg daily by PCP without issue.  However  she has been without metoprolol for the last 1 week.  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  Over the last several weeks patient developed worsening shortness of breath which prompted her to be evaluated by PCP.  Patient states shortness of breath has significantly improved since initiation of valsartan.  She is also taking Lasix 40 mg once daily.  Patient also has bilateral lower leg edema which is improved since visit at PCPs office.  Denies chest pain, palpitations, syncope, near syncope, dizziness.  Denies orthopnea or PND.  Past Medical History:  Diagnosis Date   DM2 (diabetes mellitus, type 2) (HCC)    Hypertension    MI (myocardial infarction) (HCC)    Symptomatic anemia    11/2018   Past Surgical History:  Procedure Laterality Date   ABDOMINAL HYSTERECTOMY     BIOPSY  07/12/2020   Procedure: BIOPSY;  Surgeon: Charlott Rakes, MD;  Location: [facility] ENDOSCOPY;  Service: Endoscopy;;   COLONOSCOPY  07/12/2020   COLONOSCOPY WITH PROPOFOL N/A 07/12/2020   Procedure: COLONOSCOPY WITH PROPOFOL;  Surgeon: Charlott Rakes, MD;  Location: [facility] ENDOSCOPY;  Service: Endoscopy;  Laterality: N/A;   ESOPHAGOGASTRODUODENOSCOPY  07/12/2020   ESOPHAGOGASTRODUODENOSCOPY N/A 07/12/2020   Procedure: ESOPHAGOGASTRODUODENOSCOPY (EGD);  Surgeon: Charlott Rakes, MD;  Location: [facility] ENDOSCOPY;  Service: Endoscopy;  Laterality: N/A;   ESOPHAGOGASTRODUODENOSCOPY N/A 04/19/2021   Procedure: ESOPHAGOGASTRODUODENOSCOPY (EGD);  Surgeon: Charlott Rakes, MD;  Location: [facility] ENDOSCOPY;  Service: Endoscopy;  Laterality: N/A;   HOT HEMOSTASIS N/A 07/12/2020   Procedure: HOT HEMOSTASIS (ARGON PLASMA COAGULATION/BICAP);  Surgeon:  Charlott Rakes, MD;  Location: [facility] ENDOSCOPY;  Service: Endoscopy;  Laterality: N/A;  EGD and COLON   Family History  Problem Relation Age of Onset   Anemia Mother    Stroke Mother    Anemia Daughter     Social History   Tobacco  Use   Smoking status: Former    Packs/day: 1.00    Years: 29.00    Pack years: 29.00    Types: Cigarettes    Quit date: 1993    Years since quitting: 29.7   Smokeless tobacco: Never   Tobacco comments:    SMOKED FOR 29 YEARS   Substance Use Topics   Alcohol use: No   Marital Status: Widowed   ROS  Review of Systems  Constitutional: Negative for malaise/fatigue and weight gain.  Cardiovascular:  Positive for dyspnea on exertion (improving) and leg swelling (bilateral). Negative for chest pain, claudication, near-syncope, orthopnea, palpitations, paroxysmal nocturnal dyspnea and syncope.  Neurological:  Negative for dizziness.   Objective  There were no vitals taken for this visit.  Vitals with BMI 08/23/2021 08/23/2021 04/20/2021  Height - 5\' 1"  -  Weight - 145 lbs -  BMI - 27.41 -  Systolic 156 158 478  Diastolic 73 68 65  Pulse 96 83 69      Physical Exam Vitals reviewed.  HENT:     Head: Normocephalic and atraumatic.  Neck:     Vascular: JVD present.  Cardiovascular:     Rate and Rhythm: Normal rate and regular rhythm.     Pulses: Intact distal pulses.     Heart sounds: S1 normal and S2 normal. Murmur heard.  Midsystolic murmur is present with a grade of 2/6 at the apex.    No gallop.  Pulmonary:     Effort: Pulmonary effort is normal. No respiratory distress.     Breath sounds: No wheezing, rhonchi or rales.  Musculoskeletal:     Right lower leg: Edema (minimal) present.     Left lower leg: Edema (minimal) present.  Neurological:     Mental Status: She is alert.    Laboratory examination:   Recent Labs    04/18/21 0605 04/19/21 0736 04/19/21 1149 04/20/21 0532  NA 137 138 138 138  K 3.6 3.4* 3.5 4.2  CL 105 105 106 106  CO2 25 23  --  23  GLUCOSE 127* 109* 106* 133*  BUN 24* 19 14 17   CREATININE 1.41* 1.26* 1.10* 1.33*  CALCIUM 8.8* 8.9  --  9.1  GFRNONAA 38* 43*  --  40*    CrCl cannot be calculated (Patient's most recent lab result is older  than the maximum 21 days allowed.).  CMP Latest Ref Rng & Units 04/20/2021 04/19/2021 04/19/2021  Glucose 70 - 99 mg/dL 295(A) 213(Y) 865(H)  BUN 8 - 23 mg/dL 17 14 19   Creatinine 0.44 - 1.00 mg/dL 8.46(N) 6.29(B) 2.84(X)  Sodium 135 - 145 mmol/L 138 138 138  Potassium 3.5 - 5.1 mmol/L 4.2 3.5 3.4(L)  Chloride 98 - 111 mmol/L 106 106 105  CO2 22 - 32 mmol/L 23 - 23  Calcium 8.9 - 10.3 mg/dL 9.1 - 8.9  Total Protein 6.5 - 8.1 g/dL - - 6.2(L)  Total Bilirubin 0.3 - 1.2 mg/dL - - 1.0  Alkaline Phos 38 - 126 U/L - - 112  AST 15 - 41 U/L - - 34  ALT 0 - 44 U/L - - 39   CBC Latest Ref Rng & Units 04/20/2021 04/19/2021  04/18/2021  WBC 4.0 - 10.5 K/uL 6.7 - -  Hemoglobin 12.0 - 15.0 g/dL 7.9(L) 7.5(L) 7.1(L)  Hematocrit 36.0 - 46.0 % 26.2(L) 22.0(L) 22.9(L)  Platelets 150 - 400 K/uL 263 - -    Lipid Panel No results for input(s): CHOL, TRIG, LDLCALC, VLDL, HDL, CHOLHDL, LDLDIRECT in the last 8760 hours.  HEMOGLOBIN A1C Lab Results  Component Value Date   HGBA1C 6.3 (H) 04/18/2021   MPG 134 04/18/2021   TSH No results for input(s): TSH in the last 8760 hours.  External labs:   08/15/2021: Hemoglobin 8.3, hematocrit 29, MCV 79.9, platelet 292 Glucose 105, BUN 13, creatinine 0.87, GFR 67, sodium 140, potassium 3.5, BNP 452  Allergies  No Known Allergies   Medications Prior to Visit:   Outpatient Medications Prior to Visit  Medication Sig Dispense Refill   Ferrous Sulfate Dried (HIGH POTENCY IRON) 65 MG TABS Fusion Plus 130 mg iron-1,250 mcg capsule  Take 1 capsule every day by oral route.     furosemide (LASIX) 40 MG tablet Take 1 tablet (40 mg total) by mouth daily. 90 tablet 3   metFORMIN (GLUCOPHAGE-XR) 500 MG 24 hr tablet Take 500 mg by mouth daily with breakfast.     metoprolol succinate (TOPROL-XL) 100 MG 24 hr tablet Take 1 tablet (100 mg total) by mouth at bedtime. Take with or immediately following a meal. 90 tablet 3   pantoprazole (PROTONIX) 40 MG tablet Take 1 tablet  (40 mg total) by mouth 2 (two) times daily before a meal. 60 tablet 0   Potassium Chloride ER 20 MEQ TBCR Take 20 mEq by mouth daily. 90 tablet 3   rosuvastatin (CRESTOR) 10 MG tablet Take 1 tablet (10 mg total) by mouth daily. 90 tablet 3   triamcinolone cream (KENALOG) 0.1 % Apply 1 application topically every 8 (eight) hours as needed (itching).      valsartan (DIOVAN) 80 MG tablet Take 1 tablet by mouth daily.     No facility-administered medications prior to visit.   Final Medications at End of Visit    No outpatient medications have been marked as taking for the 09/09/21 encounter (Appointment) with Carlynn Purl, Zayd Bonet C, PA-C.   Radiology:   No results found.  Cardiac Studies:   Echocardiogram 04/19/2021:   1. Left ventricular ejection fraction, by estimation, is 50%. The left ventricle has mildly decreased function. The left ventricle demonstrates regional wall motion abnormalities with severe hypokinesis of the basal inferior and basal inferolateral  walls, hypokinesis of the basal anterolateral wall. Left ventricular diastolic parameters are consistent with Grade II diastolic dysfunction (pseudonormalization).  2. Right ventricular systolic function is mildly reduced. The right ventricular size is normal. There is normal pulmonary artery systolic pressure. The estimated right ventricular systolic pressure is 29.3 mmHg.  3. Left atrial size was severely dilated.  4. Right atrial size was mildly dilated.  5. The mitral valve is abnormal. At least moderate to possibly severe mitral valve regurgitation. This may not be fully visualized. Possible infarct-related mitral regurgitation given regional wall motion abnormalities. No evidence of mitral stenosis.  6. The aortic valve is tricuspid. Aortic valve regurgitation is not visualized. Mild aortic valve sclerosis is present, with no evidence of aortic valve stenosis.  7. The inferior vena cava is dilated in size with <50% respiratory  variability, suggesting right atrial pressure of 15 mmHg.   Lower Extremity Venous Duplex  04/19/2021: BILATERAL:  - No evidence of deep vein thrombosis seen in the lower extremities,  bilaterally.  - No evidence of superficial venous thrombosis in the lower extremities,  bilaterally.  -No evidence of popliteal cyst, bilaterally.   EKG:   08/23/2021: Wandering atrial pacemaker.  Episodes of junctional escape (4).  Frequent PVCs (3).  At a rate of 101 bpm.  Normal axis.  Nonspecific T wave abnormality.  04/17/2021: Sinus versus ectopic atrial rhythm at rate of 90 bpm, normal axis.  Poor R wave progression, cannot exclude anteroseptal infarct old, nonspecific T abnormality.  Assessment   No diagnosis found.    There are no discontinued medications.   No orders of the defined types were placed in this encounter.   Recommendations:   Jasmin Miller is a 80 y.o. 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  Patient reportedly had stress test and subsequent cardiac catheterization remotely in [place], however results are unknown.  Patient reports no history of stenting to coronary arteries.   Admitted 04/18/2021 - 04/20/2021 with severe anemia and subsequently developed episodes of multifocal atrial tachycardia during admission.  Patient presented to our office 08/23/2021 for follow-up.  At last office visit patient had been without metoprolol for 1 week and therefore blood pressure and heart rate were elevated.  Resumed metoprolol succinate 100 mg daily and increased Lasix to 40 mg twice daily for 3 days given clinical volume overload. She now present for 2-week follow up. ***  ***  Patient was last seen in our office by Dr. Rosemary Holms 2020, unfortunately she was lost to follow-up.  Patient then admitted 04/18/2021 - 04/20/2021 with worsening dyspnea  and fatigue, she was found to be severely anemic.  At that time our office was consulted to manage acute diastolic heart failure.  Also during the stay patient had episodes of multifocal atrial tachycardia, no anticoagulation recommended at this time due to severe anemia and history of AVM.  At discharge she was recommended for follow-up with our office in 10 days, however she was lost to follow-up.  Patient was then seen by her PCP 08/15/2021 with worsening shortness of breath and chest x-ray concerning for mild pulmonary edema.  He now presents for further evaluation and management.  Patient symptoms of dyspnea on exertion leg swelling have improved with addition of valsartan.  Unfortunately she has been without her metoprolol for the last 1 week, and blood pressure and heart rate are elevated in the office today.  We will resume metoprolol succinate 100 mg daily.  Patient does continue to have bilateral leg edema and JVD, will therefore increase Lasix to 40 mg twice daily for the next 3 days, then return to taking 40 mg once daily.  We will repeat BMP and BNP in 1 week at PCPs office as patient is currently scheduled for follow-up.  Could consider switching patient from valsartan to [facility] in the future, however will hold off for now.  We will first reinitiate beta-blocker therapy.  We will plan to discuss with PCP addition of SGLT2 inhibitor, which I would recommend if patient will tolerate given renal function hemodynamics at next visit with PCP next week.  We will also consider addition of spironolactone in the future.   Follow-up in 2 weeks, sooner if needed, for HFpEF, hypertension, wandering atrial pacemaker.    This was a 45-minute encounter with face-to-face counseling, medical records review, coordination of care, explanation of complex medical issues, complex medical decision making, explaining pathophysiology of HFpEF and wandering  atrial pacemaker.     Rayford Halsted, PA-C 09/03/2021,  1:59 PM Office: [phone]

## 2021-09-09 ENCOUNTER — Ambulatory Visit: Payer: Medicare Other | Admitting: Student

## 2021-09-09 DIAGNOSIS — I498 Other specified cardiac arrhythmias: Secondary | ICD-10-CM

## 2021-09-09 DIAGNOSIS — I5043 Acute on chronic combined systolic (congestive) and diastolic (congestive) heart failure: Secondary | ICD-10-CM

## 2021-09-09 DIAGNOSIS — I10 Essential (primary) hypertension: Secondary | ICD-10-CM

## 2021-09-10 NOTE — Progress Notes (Signed)
Primary Physician/Referring:  Lindaann Pascal, PA-C  Patient ID: Jasmin Miller, female    DOB: 1941-11-25, 80 y.o.   MRN: [id number]  Chief Complaint  Patient presents with   Acute on chronic combined systolic and diastolic CHF (conge   Follow-up    2 week   HPI:    Ciarra Braddy  is a 80 y.o. 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  Patient reportedly had stress test and subsequent cardiac catheterization remotely in [place], however results are unknown.  Patient reports no history of stenting to coronary arteries.   Admitted 04/18/2021 - 04/20/2021 with severe anemia and subsequently developed episodes of multifocal atrial tachycardia during admission.  Patient presented to our office 08/23/2021 for follow-up.  At last office visit patient had been without metoprolol for 1 week and therefore blood pressure and heart rate were elevated.  Resumed metoprolol succinate 100 mg daily and increased Lasix to 40 mg twice daily for 3 days given clinical volume overload. She now present for 2-week follow up.  Patient has resumed metoprolol and is now taking Lasix 40 mg once daily.  She reports resolution of dyspnea on exertion.  Unfortunately BMP and BNP were not done.  Therefore up titration of antihypertensive medication is limited.  Blood pressure remains uncontrolled in the office today as well as on home monitoring.  Denies chest pain, palpitations, dizziness, syncope, near syncope.  Denies orthopnea, PND, leg swelling.  Past Medical History:  Diagnosis Date   DM2 (diabetes mellitus, type 2) (HCC)    Hypertension    MI (myocardial infarction) (HCC)    Symptomatic anemia    11/2018   Past Surgical History:  Procedure Laterality Date   ABDOMINAL HYSTERECTOMY     BIOPSY  07/12/2020   Procedure: BIOPSY;  Surgeon: Charlott Rakes, MD;  Location: [facility] ENDOSCOPY;  Service:  Endoscopy;;   COLONOSCOPY  07/12/2020   COLONOSCOPY WITH PROPOFOL N/A 07/12/2020   Procedure: COLONOSCOPY WITH PROPOFOL;  Surgeon: Charlott Rakes, MD;  Location: [facility] ENDOSCOPY;  Service: Endoscopy;  Laterality: N/A;   ESOPHAGOGASTRODUODENOSCOPY  07/12/2020   ESOPHAGOGASTRODUODENOSCOPY N/A 07/12/2020   Procedure: ESOPHAGOGASTRODUODENOSCOPY (EGD);  Surgeon: Charlott Rakes, MD;  Location: [facility] ENDOSCOPY;  Service: Endoscopy;  Laterality: N/A;   ESOPHAGOGASTRODUODENOSCOPY N/A 04/19/2021   Procedure: ESOPHAGOGASTRODUODENOSCOPY (EGD);  Surgeon: Charlott Rakes, MD;  Location: [facility] ENDOSCOPY;  Service: Endoscopy;  Laterality: N/A;   HOT HEMOSTASIS N/A 07/12/2020   Procedure: HOT HEMOSTASIS (ARGON PLASMA COAGULATION/BICAP);  Surgeon: Charlott Rakes, MD;  Location: [facility] ENDOSCOPY;  Service: Endoscopy;  Laterality: N/A;  EGD and COLON   Family History  Problem Relation Age of Onset   Anemia Mother    Stroke Mother    Anemia Daughter     Social History   Tobacco Use   Smoking status: Former    Packs/day: 1.00    Years: 29.00    Pack years: 29.00    Types: Cigarettes    Quit date: 1993    Years since quitting: 29.7   Smokeless tobacco: Never   Tobacco comments:    SMOKED FOR 29 YEARS   Substance Use Topics   Alcohol use: No   Marital Status: Widowed   ROS  Review of Systems  Constitutional: Negative for malaise/fatigue and weight gain.  Cardiovascular:  Positive for dyspnea on exertion (resovled) and leg swelling (bilateral, improved). Negative for chest pain, claudication, near-syncope, orthopnea, palpitations, paroxysmal nocturnal dyspnea and syncope.  Neurological:  Negative for dizziness.   Objective  Blood pressure (!) 173/81, pulse 89, temperature 98.4 F (36.9 C), temperature source Temporal, resp. rate 16, height 5\' 1"  (1.549 m), weight 142 lb (64.4 kg), SpO2 97 %.  Vitals with BMI 09/11/2021 08/23/2021 08/23/2021  Height 5\' 1"  - 5\' 1"   Weight 142 lbs - 145 lbs  BMI 26.84 -  27.41  Systolic 173 156 08/25/2021  Diastolic 81 73 68  Pulse 89 96 83      Physical Exam Vitals reviewed.  HENT:     Head: Normocephalic and atraumatic.  Neck:     Vascular: No JVD.  Cardiovascular:     Rate and Rhythm: Normal rate and regular rhythm.     Pulses: Intact distal pulses.     Heart sounds: S1 normal and S2 normal. Murmur heard.  Midsystolic murmur is present with a grade of 2/6 at the apex.    No gallop.  Pulmonary:     Effort: Pulmonary effort is normal. No respiratory distress.     Breath sounds: No wheezing, rhonchi or rales.  Musculoskeletal:     Right lower leg: Edema (minimal) present.     Left lower leg: Edema (minimal) present.  Neurological:     Mental Status: She is alert.    Laboratory examination:   Recent Labs    04/18/21 0605 04/19/21 0736 04/19/21 1149 04/20/21 0532  NA 137 138 138 138  K 3.6 3.4* 3.5 4.2  CL 105 105 106 106  CO2 25 23  --  23  GLUCOSE 127* 109* 106* 133*  BUN 24* 19 14 17   CREATININE 1.41* 1.26* 1.10* 1.33*  CALCIUM 8.8* 8.9  --  9.1  GFRNONAA 38* 43*  --  40*   CrCl cannot be calculated (Patient's most recent lab result is older than the maximum 21 days allowed.).  CMP Latest Ref Rng & Units 04/20/2021 04/19/2021 04/19/2021  Glucose 70 - 99 mg/dL ) 04/22/2021) 04/21/2021)  BUN 8 - 23 mg/dL 17 14 19   Creatinine 0.44 - 1.00 mg/dL 04/21/2021) 010(U) 725(D)  Sodium 135 - 145 mmol/L 138 138 138  Potassium 3.5 - 5.1 mmol/L 4.2 3.5 3.4(L)  Chloride 98 - 111 mmol/L 106 106 105  CO2 22 - 32 mmol/L 23 - 23  Calcium 8.9 - 10.3 mg/dL 9.1 - 8.9  Total Protein 6.5 - 8.1 g/dL - - 6.2(L)  Total Bilirubin 0.3 - 1.2 mg/dL - - 1.0  Alkaline Phos 38 - 126 U/L - - 112  AST 15 - 41 U/L - - 34  ALT 0 - 44 U/L - - 39   CBC Latest Ref Rng & Units 04/20/2021 04/19/2021 04/18/2021  WBC 4.0 - 10.5 K/uL 6.7 - -  Hemoglobin 12.0 - 15.0 g/dL 7.9(L) 7.5(L) 7.1(L)  Hematocrit 36.0 - 46.0 % 26.2(L) 22.0(L) 22.9(L)  Platelets 150 - 400 K/uL 263 - -     Lipid Panel No results for input(s): CHOL, TRIG, LDLCALC, VLDL, HDL, CHOLHDL, LDLDIRECT in the last 8760 hours.  HEMOGLOBIN A1C Lab Results  Component Value Date   HGBA1C 6.3 (H) 04/18/2021   MPG 134 04/18/2021   TSH No results for input(s): TSH in the last 8760 hours.  External labs:   08/15/2021: Hemoglobin 8.3, hematocrit 29, MCV 79.9, platelet 292 Glucose 105, BUN 13, creatinine 0.87, GFR 67, sodium 140, potassium 3.5, BNP 452  Allergies  No Known Allergies   Medications Prior to Visit:   Outpatient Medications Prior to Visit  Medication Sig Dispense  Refill   Ferrous Sulfate Dried (HIGH POTENCY IRON) 65 MG TABS Fusion Plus 130 mg iron-1,250 mcg capsule  Take 1 capsule every day by oral route.     metoprolol succinate (TOPROL-XL) 100 MG 24 hr tablet Take 1 tablet (100 mg total) by mouth at bedtime. Take with or immediately following a meal. 90 tablet 3   pantoprazole (PROTONIX) 40 MG tablet Take 1 tablet (40 mg total) by mouth 2 (two) times daily before a meal. 60 tablet 0   Potassium Chloride ER 20 MEQ TBCR Take 20 mEq by mouth daily. 90 tablet 3   rosuvastatin (CRESTOR) 10 MG tablet Take 1 tablet (10 mg total) by mouth daily. 90 tablet 3   triamcinolone cream (KENALOG) 0.1 % Apply 1 application topically every 8 (eight) hours as needed (itching).      valsartan (DIOVAN) 80 MG tablet Take 1 tablet by mouth daily.     furosemide (LASIX) 40 MG tablet Take 1 tablet (40 mg total) by mouth daily. 90 tablet 3   metFORMIN (GLUCOPHAGE-XR) 500 MG 24 hr tablet Take 500 mg by mouth daily with breakfast.     No facility-administered medications prior to visit.   Final Medications at End of Visit    Current Meds  Medication Sig   amLODipine (NORVASC) 5 MG tablet Take 1 tablet (5 mg total) by mouth daily.   Ferrous Sulfate Dried (HIGH POTENCY IRON) 65 MG TABS Fusion Plus 130 mg iron-1,250 mcg capsule  Take 1 capsule every day by oral route.   metoprolol succinate (TOPROL-XL)  100 MG 24 hr tablet Take 1 tablet (100 mg total) by mouth at bedtime. Take with or immediately following a meal.   pantoprazole (PROTONIX) 40 MG tablet Take 1 tablet (40 mg total) by mouth 2 (two) times daily before a meal.   Potassium Chloride ER 20 MEQ TBCR Take 20 mEq by mouth daily.   rosuvastatin (CRESTOR) 10 MG tablet Take 1 tablet (10 mg total) by mouth daily.   triamcinolone cream (KENALOG) 0.1 % Apply 1 application topically every 8 (eight) hours as needed (itching).    valsartan (DIOVAN) 80 MG tablet Take 1 tablet by mouth daily.   [DISCONTINUED] furosemide (LASIX) 40 MG tablet Take 1 tablet (40 mg total) by mouth daily.   [DISCONTINUED] metFORMIN (GLUCOPHAGE-XR) 500 MG 24 hr tablet Take 500 mg by mouth daily with breakfast.   Radiology:   No results found.  Cardiac Studies:   Echocardiogram 04/19/2021:   1. Left ventricular ejection fraction, by estimation, is 50%. The left ventricle has mildly decreased function. The left ventricle demonstrates regional wall motion abnormalities with severe hypokinesis of the basal inferior and basal inferolateral  walls, hypokinesis of the basal anterolateral wall. Left ventricular diastolic parameters are consistent with Grade II diastolic dysfunction (pseudonormalization).  2. Right ventricular systolic function is mildly reduced. The right ventricular size is normal. There is normal pulmonary artery systolic pressure. The estimated right ventricular systolic pressure is 29.3 mmHg.  3. Left atrial size was severely dilated.  4. Right atrial size was mildly dilated.  5. The mitral valve is abnormal. At least moderate to possibly severe mitral valve regurgitation. This may not be fully visualized. Possible infarct-related mitral regurgitation given regional wall motion abnormalities. No evidence of mitral stenosis.  6. The aortic valve is tricuspid. Aortic valve regurgitation is not visualized. Mild aortic valve sclerosis is present, with no  evidence of aortic valve stenosis.  7. The inferior vena cava is dilated in size  with <50% respiratory variability, suggesting right atrial pressure of 15 mmHg.   Lower Extremity Venous Duplex  04/19/2021: BILATERAL:  - No evidence of deep vein thrombosis seen in the lower extremities,  bilaterally.  - No evidence of superficial venous thrombosis in the lower extremities,  bilaterally.  -No evidence of popliteal cyst, bilaterally.   EKG:   08/23/2021: Wandering atrial pacemaker.  Episodes of junctional escape (4).  Frequent PVCs (3).  At a rate of 101 bpm.  Normal axis. Nonspecific T wave abnormality.  04/17/2021: Sinus versus ectopic atrial rhythm at rate of 90 bpm, normal axis.  Poor R wave progression, cannot exclude anteroseptal infarct old, nonspecific T abnormality.  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 Essential hypertension  I10     3. Hypercholesterolemia  E78.00     4. Chronic diastolic heart failure (HCC)  F57.32        Medications Discontinued During This Encounter  Medication Reason   metFORMIN (GLUCOPHAGE-XR) 500 MG 24 hr tablet Reorder   furosemide (LASIX) 40 MG tablet Reorder    Meds ordered this encounter  Medications   metFORMIN (GLUCOPHAGE-XR) 500 MG 24 hr tablet    Sig: Take 1 tablet (500 mg total) by mouth daily with breakfast.    Dispense:  30 tablet    Refill:  0   furosemide (LASIX) 40 MG tablet    Sig: Take 1 tablet (40 mg total) by mouth daily.    Dispense:  90 tablet    Refill:  3   amLODipine (NORVASC) 5 MG tablet    Sig: Take 1 tablet (5 mg total) by mouth daily.    Dispense:  30 tablet    Refill:  3    Recommendations:   Corynne Scibilia is a 80 y.o. 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  Patient reportedly had stress test and subsequent cardiac  catheterization remotely in [place], however results are unknown.  Patient reports no history of stenting to coronary arteries.   Admitted 04/18/2021 - 04/20/2021 with severe anemia and subsequently developed episodes of multifocal atrial tachycardia during admission.  Patient presented to our office 08/23/2021 for follow-up.  At last office visit patient had been without metoprolol for 1 week and therefore blood pressure and heart rate were elevated.  Resumed metoprolol succinate 100 mg daily and increased Lasix to 40 mg twice daily for 3 days given clinical volume overload. She now present for 2-week follow up.  Patient's swelling and dyspnea have improved since last office visit.  There is no clinical evidence of acute decompensated heart failure.    Notably patient did not take her valsartan this morning and blood pressure is uncontrolled.  However she also reports uncontrolled home blood pressure readings.  We will therefore add amlodipine 5 mg daily.  Options are limited as patient has not had repeat BMP done, although would consider transitioning from valsartan to [facility] and adding spironolactone if renal function allows.  Patient would also benefit from initiation of SGLT2 inhibitor as renal function hemodynamics allow.  Patient also requesting metformin refill as she is out of her medication.  Will provide 1 month refill, otherwise advised patient follow-up with PCP for further metformin prescription.  Counseled patient regarding signs symptoms that would warrant urgent or emergent evaluation, she verbalized understanding agreement.  We will plan to repeat  echocardiogram in approximately 6 months to reevaluate. Will also obtain stress test given reduced LVEF and regional wall motion abnormalities.   Follow up in 4 weeks, sooner if needed, for hypertension.    Rayford Halsted, PA-C 09/11/2021, 1:49 PM Office: [phone]

## 2021-09-11 ENCOUNTER — Other Ambulatory Visit: Payer: Self-pay

## 2021-09-11 ENCOUNTER — Ambulatory Visit: Payer: Medicare Other | Admitting: Student

## 2021-09-11 ENCOUNTER — Other Ambulatory Visit: Payer: Self-pay | Admitting: Student

## 2021-09-11 ENCOUNTER — Encounter: Payer: Self-pay | Admitting: Student

## 2021-09-11 VITALS — BP 173/81 | HR 89 | Temp 98.4°F | Resp 16 | Ht 61.0 in | Wt 142.0 lb

## 2021-09-11 DIAGNOSIS — I498 Other specified cardiac arrhythmias: Secondary | ICD-10-CM

## 2021-09-11 DIAGNOSIS — I10 Essential (primary) hypertension: Secondary | ICD-10-CM

## 2021-09-11 DIAGNOSIS — I5032 Chronic diastolic (congestive) heart failure: Secondary | ICD-10-CM

## 2021-09-11 DIAGNOSIS — R931 Abnormal findings on diagnostic imaging of heart and coronary circulation: Secondary | ICD-10-CM

## 2021-09-11 DIAGNOSIS — E7800 Pure hypercholesterolemia, unspecified: Secondary | ICD-10-CM

## 2021-09-11 MED ORDER — AMLODIPINE BESYLATE 5 MG PO TABS: 5.0000 mg | ORAL_TABLET | Freq: Every day | ORAL | 3 refills | Status: DC

## 2021-09-11 MED ORDER — METFORMIN HCL ER 500 MG PO TB24: 500.0000 mg | ORAL_TABLET | Freq: Every day | ORAL | 0 refills | Status: DC

## 2021-09-11 MED ORDER — FUROSEMIDE 40 MG PO TABS: 40.0000 mg | ORAL_TABLET | Freq: Every day | ORAL | 3 refills | Status: DC

## 2021-10-09 ENCOUNTER — Ambulatory Visit: Payer: Medicare Other | Admitting: Student

## 2021-10-09 NOTE — Progress Notes (Deleted)
Primary Physician/Referring:  Lindaann PascalLong, Scott, PA-C  Patient ID: Jasmin Miller, female    DOB: 1941/10/05, 80 y.o.   MRN: [id number]  No chief complaint on file.  HPI:    Jasmin EdmanCarrie Miller  is a 80 y.o. 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  Patient reportedly had stress test and subsequent cardiac catheter[place], however results are unknown.  Patient reports no history of stenting to coronary arteries.   Admitted 04/18/2021 - 04/20/2021 with severe anemia and subsequently developed episodes of multifocal atrial tachycardia during admission.  Patient presents for 4-week follow-up of hypertension.  Last office visit added amlodipine 5 mg daily and ordered stress test which has not been done. ***  ***   Patient presented to our office 08/23/2021 for follow-up.  At last office visit patient had been without metoprolol for 1 week and therefore blood pressure and heart rate were elevated.  Resumed metoprolol succinate 100 mg daily and increased Lasix to 40 mg twice daily for 3 days given clinical volume overload. She now present for 2-week follow up.  Patient has resumed metoprolol and is now taking Lasix 40 mg once daily.  She reports resolution of dyspnea on exertion.  Unfortunately BMP and BNP were not done.  Therefore up titration of antihypertensive medication is limited.  Blood pressure remains uncontrolled in the office today as well as on home monitoring.  Denies chest pain, palpitations, dizziness, syncope, near syncope.  Denies orthopnea, PND, leg swelling.  Past Medical History:  Diagnosis Date   DM2 (diabetes mellitus, type 2) (HCC)    Hypertension    MI (myocardial infarction) (HCC)    Symptomatic anemia    11/2018   Past Surgical History:  Procedure Laterality Date   ABDOMINAL HYSTERECTOMY     BIOPSY  07/12/2020   Procedure: BIOPSY;  Surgeon:  Charlott RakesSchooler, Vincent, MD;  [facility];  Service: Endoscopy;;   COLONOSCOPY  07/12/2020   COLONOSCOPY WITH PROPOFOL N/A 07/12/2020   Procedure: COLONOSCOPY WITH PROPOFOL;  Surgeon: Charlott RakesSchooler, Vincent, MD;  [facility];  Service: Endoscopy;  Laterality: N/A;   ESOPHAGOGASTRODUODENOSCOPY  07/12/2020   ESOPHAGOGASTRODUODENOSCOPY N/A 07/12/2020   Procedure: ESOPHAGOGASTRODUODENOSCOPY (EGD);  Surgeon: Charlott RakesSchooler, Vincent, MD;  [facility];  Service: Endoscopy;  Laterality: N/A;   ESOPHAGOGASTRODUODENOSCOPY N/A 04/19/2021   Procedure: ESOPHAGOGASTRODUODENOSCOPY (EGD);  Surgeon: Charlott RakesSchooler, Vincent, MD;  [facility];  Service: Endoscopy;  Laterality: N/A;   HOT HEMOSTASIS N/A 07/12/2020   Procedure: HOT HEMOSTASIS (ARGON PLASMA COAGULATION/BICAP);  Surgeon: Charlott RakesSchooler, Vincent, MD;  [facility];  Service: Endoscopy;  Laterality: N/A;  EGD and COLON   Family History  Problem Relation Age of Onset   Anemia Mother    Stroke Mother    Anemia Daughter     Social History   Tobacco Use   Smoking status: Former    Packs/day: 1.00    Years: 29.00    Pack years: 29.00    Types: Cigarettes    Quit date: 1993    Years since quitting: 29.8   Smokeless tobacco: Never   Tobacco comments:    SMOKED FOR 29 YEARS   Substance Use Topics   Alcohol use: No   Marital Status: Widowed   ROS  Review of Systems  Constitutional: Negative for malaise/fatigue and weight gain.  Cardiovascular:  Positive for dyspnea on exertion (resovled) and leg swelling (bilateral, improved). Negative for chest pain, claudication, near-syncope,  orthopnea, palpitations, paroxysmal nocturnal dyspnea and syncope.  Neurological:  Negative for dizziness.   Objective  There were no vitals taken for this visit.  Vitals with BMI 09/11/2021 08/23/2021 08/23/2021  Height 5\' 1"  - 5\' 1"   Weight 142 lbs - 145 lbs  BMI 26.84 - 27.41  Systolic 173 156  Diastolic 81 73 68  Pulse 89 96 83       Physical Exam Vitals reviewed.  HENT:     Head: Normocephalic and atraumatic.  Neck:     Vascular: No JVD.  Cardiovascular:     Rate and Rhythm: Normal rate and regular rhythm.     Pulses: Intact distal pulses.     Heart sounds: S1 normal and S2 normal. Murmur heard.  Midsystolic murmur is present with a grade of 2/6 at the apex.    No gallop.  Pulmonary:     Effort: Pulmonary effort is normal. No respiratory distress.     Breath sounds: No wheezing, rhonchi or rales.  Musculoskeletal:     Right lower leg: Edema (minimal) present.     Left lower leg: Edema (minimal) present.  Neurological:     Mental Status: She is alert.    Laboratory examination:   Recent Labs    04/18/21 0605 04/19/21 0736 04/19/21 1149 04/20/21 0532  NA 137 138 138 138  K 3.6 3.4* 3.5 4.2  CL 105 105 106 106  CO2 25 23  --  23  GLUCOSE 127* 109* 106* 133*  BUN 24* 19 14 17   CREATININE 1.41* 1.26* 1.10* 1.33*  CALCIUM 8.8* 8.9  --  9.1  GFRNONAA 38* 43*  --  40*    CrCl cannot be calculated (Patient's most recent lab result is older than the maximum 21 days allowed.).  CMP Latest Ref Rng & Units 04/20/2021 04/19/2021 04/19/2021  Glucose 70 - 99 mg/dL 04/22/2021) 04/21/2021) 04/21/2021)  BUN 8 - 23 mg/dL 17 14 19   Creatinine 0.44 - 1.00 mg/dL 379(K) 240(X) 735(H)  Sodium 135 - 145 mmol/L 138 138 138  Potassium 3.5 - 5.1 mmol/L 4.2 3.5 3.4(L)  Chloride 98 - 111 mmol/L 106 106 105  CO2 22 - 32 mmol/L 23 - 23  Calcium 8.9 - 10.3 mg/dL 9.1 - 8.9  Total Protein 6.5 - 8.1 g/dL - - 6.2(L)  Total Bilirubin 0.3 - 1.2 mg/dL - - 1.0  Alkaline Phos 38 - 126 U/L - - 112  AST 15 - 41 U/L - - 34  ALT 0 - 44 U/L - - 39   CBC Latest Ref Rng & Units 04/20/2021 04/19/2021 04/18/2021  WBC 4.0 - 10.5 K/uL 6.7 - -  Hemoglobin 12.0 - 15.0 g/dL 7.9(L) 7.5(L) 7.1(L)  Hematocrit 36.0 - 46.0 % 26.2(L) 22.0(L) 22.9(L)  Platelets 150 - 400 K/uL 263 - -    Lipid Panel No results for input(s): CHOL, TRIG, LDLCALC, VLDL, HDL,  CHOLHDL, LDLDIRECT in the last 8760 hours.  HEMOGLOBIN A1C Lab Results  Component Value Date   HGBA1C 6.3 (H) 04/18/2021   MPG 134 04/18/2021   TSH No results for input(s): TSH in the last 8760 hours.  External labs:   08/15/2021: Hemoglobin 8.3, hematocrit 29, MCV 79.9, platelet 292 Glucose 105, BUN 13, creatinine 0.87, GFR 67, sodium 140, potassium 3.5, BNP 452  Allergies  No Known Allergies   Medications Prior to Visit:   Outpatient Medications Prior to Visit  Medication Sig Dispense Refill   amLODipine (NORVASC) 5 MG tablet Take 1 tablet (  5 mg total) by mouth daily. 30 tablet 3   Ferrous Sulfate Dried (HIGH POTENCY IRON) 65 MG TABS Fusion Plus 130 mg iron-1,250 mcg capsule  Take 1 capsule every day by oral route.     furosemide (LASIX) 40 MG tablet Take 1 tablet (40 mg total) by mouth daily. 90 tablet 3   metFORMIN (GLUCOPHAGE-XR) 500 MG 24 hr tablet TAKE 1 TABLET(500 MG) BY MOUTH DAILY WITH BREAKFAST 90 tablet 3   metoprolol succinate (TOPROL-XL) 100 MG 24 hr tablet Take 1 tablet (100 mg total) by mouth at bedtime. Take with or immediately following a meal. 90 tablet 3   pantoprazole (PROTONIX) 40 MG tablet Take 1 tablet (40 mg total) by mouth 2 (two) times daily before a meal. 60 tablet 0   Potassium Chloride ER 20 MEQ TBCR Take 20 mEq by mouth daily. 90 tablet 3   rosuvastatin (CRESTOR) 10 MG tablet Take 1 tablet (10 mg total) by mouth daily. 90 tablet 3   triamcinolone cream (KENALOG) 0.1 % Apply 1 application topically every 8 (eight) hours as needed (itching).      valsartan (DIOVAN) 80 MG tablet Take 1 tablet by mouth daily.     No facility-administered medications prior to visit.   Final Medications at End of Visit    No outpatient medications have been marked as taking for the 10/09/21 encounter (Appointment) with Rayetta Pigg, Ishmel Acevedo C, PA-C.   Radiology:   No results found.  Cardiac Studies:   Echocardiogram 04/19/2021:   1. Left ventricular ejection  fraction, by estimation, is 50%. The left ventricle has mildly decreased function. The left ventricle demonstrates regional wall motion abnormalities with severe hypokinesis of the basal inferior and basal inferolateral  walls, hypokinesis of the basal anterolateral wall. Left ventricular diastolic parameters are consistent with Grade II diastolic dysfunction (pseudonormalization).  2. Right ventricular systolic function is mildly reduced. The right ventricular size is normal. There is normal pulmonary artery systolic pressure. The estimated right ventricular systolic pressure is [id number] mmHg.  3. Left atrial size was severely dilated.  4. Right atrial size was mildly dilated.  5. The mitral valve is abnormal. At least moderate to possibly severe mitral valve regurgitation. This may not be fully visualized. Possible infarct-related mitral regurgitation given regional wall motion abnormalities. No evidence of mitral stenosis.  6. The aortic valve is tricuspid. Aortic valve regurgitation is not visualized. Mild aortic valve sclerosis is present, with no evidence of aortic valve stenosis.  7. The inferior vena cava is dilated in size with <50% respiratory variability, suggesting right atrial pressure of 15 mmHg.   Lower Extremity Venous Duplex  04/19/2021: BILATERAL:  - No evidence of deep vein thrombosis seen in the lower extremities,  bilaterally.  - No evidence of superficial venous thrombosis in the lower extremities,  bilaterally.  -No evidence of popliteal cyst, bilaterally.   EKG:   08/23/2021: Wandering atrial pacemaker.  Episodes of junctional escape (4).  Frequent PVCs (3).  At a rate of 101 bpm.  Normal axis. Nonspecific T wave abnormality.  04/17/2021: Sinus versus ectopic atrial rhythm at rate of 90 bpm, normal axis.  Poor R wave progression, cannot exclude anteroseptal infarct old, nonspecific T abnormality.  Assessment   No diagnosis found.    There are no discontinued  medications.   No orders of the defined types were placed in this encounter.   Recommendations:   Jasmin Miller is a 80 y.o. 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  Patient reportedly had stress test and subsequent cardiac catheterization remotely in [place], however results are unknown.  Patient reports no history of stenting to coronary arteries.   Admitted 04/18/2021 - 04/20/2021 with severe anemia and subsequently developed episodes of multifocal atrial tachycardia during admission.     Patient presents for 4-week follow-up of hypertension.  Last office visit added amlodipine 5 mg daily and ordered stress test which has not been done.  Notably previously ordered BNP and BMP have also not been done.***  ***  Patient presented to our office 08/23/2021 for follow-up.  At last office visit patient had been without metoprolol for 1 week and therefore blood pressure and heart rate were elevated.  Resumed metoprolol succinate 100 mg daily and increased Lasix to 40 mg twice daily for 3 days given clinical volume overload. She now present for 2-week follow up.  Patient's swelling and dyspnea have improved since last office visit.  There is no clinical evidence of acute decompensated heart failure.    Notably patient did not take her valsartan this morning and blood pressure is uncontrolled.  However she also reports uncontrolled home blood pressure readings.  We will therefore add amlodipine 5 mg daily.  Options are limited as patient has not had repeat BMP done, although would consider transitioning from valsartan to [facility] and adding spironolactone if renal function allows.  Patient would also benefit from initiation of SGLT2 inhibitor as renal function hemodynamics allow.  Patient also requesting metformin refill as she is out of her medication.   Will provide 1 month refill, otherwise advised patient follow-up with PCP for further metformin prescription.  Counseled patient regarding signs symptoms that would warrant urgent or emergent evaluation, she verbalized understanding agreement.  We will plan to repeat echocardiogram in approximately 6 months to reevaluate. Will also obtain stress test given reduced LVEF and regional wall motion abnormalities.   Follow up in 4 weeks, sooner if needed, for hypertension.    Alethia Berthold, PA-C 10/09/2021, 12:32 PM Office: [phone]

## 2021-10-16 ENCOUNTER — Telehealth: Payer: Self-pay | Admitting: Hematology and Oncology

## 2021-10-16 NOTE — Telephone Encounter (Signed)
Pt's daughter called in and said that her mother had contacted Scott Long's office for details about the referral and was now ready to schedule. I scheduled the appt and gave the details to pt's daughter.

## 2021-10-17 ENCOUNTER — Inpatient Hospital Stay: Payer: Medicare Other | Attending: Hematology and Oncology | Admitting: Hematology and Oncology

## 2021-10-17 ENCOUNTER — Inpatient Hospital Stay: Payer: Medicare Other

## 2021-10-17 ENCOUNTER — Other Ambulatory Visit: Payer: Self-pay

## 2021-10-17 VITALS — BP 163/84 | HR 85 | Temp 96.8°F | Resp 17 | Wt 138.4 lb

## 2021-10-17 DIAGNOSIS — D500 Iron deficiency anemia secondary to blood loss (chronic): Secondary | ICD-10-CM

## 2021-10-17 DIAGNOSIS — Z7984 Long term (current) use of oral hypoglycemic drugs: Secondary | ICD-10-CM | POA: Diagnosis not present

## 2021-10-17 DIAGNOSIS — Z832 Family history of diseases of the blood and blood-forming organs and certain disorders involving the immune mechanism: Secondary | ICD-10-CM | POA: Diagnosis not present

## 2021-10-17 DIAGNOSIS — E785 Hyperlipidemia, unspecified: Secondary | ICD-10-CM | POA: Insufficient documentation

## 2021-10-17 DIAGNOSIS — Z87891 Personal history of nicotine dependence: Secondary | ICD-10-CM | POA: Diagnosis not present

## 2021-10-17 DIAGNOSIS — Z823 Family history of stroke: Secondary | ICD-10-CM | POA: Insufficient documentation

## 2021-10-17 DIAGNOSIS — Z8249 Family history of ischemic heart disease and other diseases of the circulatory system: Secondary | ICD-10-CM | POA: Diagnosis not present

## 2021-10-17 DIAGNOSIS — I252 Old myocardial infarction: Secondary | ICD-10-CM | POA: Insufficient documentation

## 2021-10-17 DIAGNOSIS — I110 Hypertensive heart disease with heart failure: Secondary | ICD-10-CM | POA: Diagnosis not present

## 2021-10-17 DIAGNOSIS — E119 Type 2 diabetes mellitus without complications: Secondary | ICD-10-CM | POA: Insufficient documentation

## 2021-10-17 DIAGNOSIS — I509 Heart failure, unspecified: Secondary | ICD-10-CM | POA: Insufficient documentation

## 2021-10-17 DIAGNOSIS — K5900 Constipation, unspecified: Secondary | ICD-10-CM | POA: Insufficient documentation

## 2021-10-17 DIAGNOSIS — D509 Iron deficiency anemia, unspecified: Secondary | ICD-10-CM | POA: Insufficient documentation

## 2021-10-17 LAB — CBC WITH DIFFERENTIAL (CANCER CENTER ONLY)
Abs Immature Granulocytes: 0.01 10*3/uL (ref 0.00–0.07)
Basophils Absolute: 0 10*3/uL (ref 0.0–0.1)
Basophils Relative: 1 %
Eosinophils Absolute: 0.4 10*3/uL (ref 0.0–0.5)
Eosinophils Relative: 6 %
HCT: 30.9 % — ABNORMAL LOW (ref 36.0–46.0)
Hemoglobin: 9 g/dL — ABNORMAL LOW (ref 12.0–15.0)
Immature Granulocytes: 0 %
Lymphocytes Relative: 18 %
Lymphs Abs: 1.2 10*3/uL (ref 0.7–4.0)
MCH: 21.4 pg — ABNORMAL LOW (ref 26.0–34.0)
MCHC: 29.1 g/dL — ABNORMAL LOW (ref 30.0–36.0)
MCV: 73.4 fL — ABNORMAL LOW (ref 80.0–100.0)
Monocytes Absolute: 0.7 10*3/uL (ref 0.1–1.0)
Monocytes Relative: 11 %
Neutro Abs: 4.4 10*3/uL (ref 1.7–7.7)
Neutrophils Relative %: 64 %
Platelet Count: 258 10*3/uL (ref 150–400)
RBC: 4.21 MIL/uL (ref 3.87–5.11)
RDW: 24.1 % — ABNORMAL HIGH (ref 11.5–15.5)
WBC Count: 6.7 10*3/uL (ref 4.0–10.5)
nRBC: 0 % (ref 0.0–0.2)

## 2021-10-17 LAB — IRON AND TIBC
Iron: 202 ug/dL — ABNORMAL HIGH (ref 41–142)
Saturation Ratios: 40 % (ref 21–57)
TIBC: 509 ug/dL — ABNORMAL HIGH (ref 236–444)
UIBC: 307 ug/dL (ref 120–384)

## 2021-10-17 LAB — CMP (CANCER CENTER ONLY)
ALT: 14 U/L (ref 0–44)
AST: 20 U/L (ref 15–41)
Albumin: 3.8 g/dL (ref 3.5–5.0)
Alkaline Phosphatase: 117 U/L (ref 38–126)
Anion gap: 10 (ref 5–15)
BUN: 13 mg/dL (ref 8–23)
CO2: 24 mmol/L (ref 22–32)
Calcium: 9.3 mg/dL (ref 8.9–10.3)
Chloride: 110 mmol/L (ref 98–111)
Creatinine: 0.95 mg/dL (ref 0.44–1.00)
GFR, Estimated: >60 mL/min (ref >60)
Glucose, Bld: 105 mg/dL — ABNORMAL HIGH (ref 70–99)
Potassium: 3.7 mmol/L (ref 3.5–5.1)
Sodium: 144 mmol/L (ref 135–145)
Total Bilirubin: 0.3 mg/dL (ref 0.3–1.2)
Total Protein: 7.7 g/dL (ref 6.5–8.1)

## 2021-10-17 LAB — FERRITIN: Ferritin: 28 ng/mL (ref 11–307)

## 2021-10-17 LAB — RETIC PANEL
Immature Retic Fract: 25 % — ABNORMAL HIGH (ref 2.3–15.9)
RBC.: 4.17 MIL/uL (ref 3.87–5.11)
Retic Count, Absolute: 121.3 10*3/uL (ref 19.0–186.0)
Retic Ct Pct: 2.9 % (ref 0.4–3.1)
Reticulocyte Hemoglobin: 28.4 pg (ref >27.9)

## 2021-10-17 NOTE — Progress Notes (Signed)
[facility] [phone]   [phone]  INITIAL CONSULT NOTE  Patient Care Team: Lindaann Pascal, PA-C as PCP - General (Physician Assistant)  Hematological/Oncological History # Microcytic Anemia 04/20/2021: WBC 6.7, Hgb 7.9, MCV 72.6, Plt 263 08/15/2021: WBC 6.1, Hgb 8.3, Plt 292, MCV 79.9. Ferritin 8, Iron sat 3%, TIBC 476 10/14/2021: WBC 5.3, Hgb 8.3, MCV 72.7, Plt 272. Ferritin 9, Iron sat 57%, TIBC 508 10/17/2021: establish care with Dr. Leonides Schanz   CHIEF COMPLAINTS/PURPOSE OF CONSULTATION:  "Iron Deficiency Anemia "  HISTORY OF PRESENTING ILLNESS:  Jasmin Miller 80 y.o. female with medical history significant for congestive heart failure, hypertension, hyperlipidemia, type 2 diabetes, and constipation who presents for evaluation of iron deficiency anemia of unclear etiology.  On review of the previous records [name] was found to have a hemoglobin of 8.3 at her last visit with iron panel consistent with IDA.  She was started on iron pills which turned her stool black and constipated her.  She reportedly has seen her GI doctor recently but there was no documentation available for further review.  Due to concern for her intolerance of p.o. iron therapy and her iron deficiency anemia she was referred to hematology for further evaluation and management.  On exam today [name] reports that she has had iron deficiency anemia since she was a child.  She reports she is not sure why as she eats plenty of beets, greens, and quite a bit of hamburger steak and [place].  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  She notes she is not having any overt signs of bleeding at this time.  She denies any nosebleeds, gum bleeding, or dark stools.  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  Prior to that removal she did not have any heavy menstrual cycles.  Her family history is remarkable for cataract and  hypertension in her mother as well as stroke.  She reports that she is an only child she does have 3 children 2 of which her daughter is with hypertension.  She is unsure of the family history from her father side of the family.  She is a former smoker having quit at the age of 55.  She does not currently drink any alcohol.  She reports that she was having difficulty with constipation with the iron pills but subsequently was started on Linzess therapy which helped to alleviate her constipation.  She reports that she feels "not like herself" and that her energy levels are quite low the used to be as she has the nap throughout the day.  She otherwise denies any fevers, chills, sweats, nausea, vomiting or diarrhea.  A full 10 point ROS is listed below.  MEDICAL HISTORY:  Past Medical History:  Diagnosis Date   DM2 (diabetes mellitus, type 2) (HCC)    Hypertension    MI (myocardial infarction) (HCC)    Symptomatic anemia    11/2018    SURGICAL HISTORY: Past Surgical History:  Procedure Laterality Date   ABDOMINAL HYSTERECTOMY     BIOPSY  07/12/2020   Procedure: BIOPSY;  Surgeon: Charlott Rakes, MD;  Location: [facility] ENDOSCOPY;  Service: Endoscopy;;   COLONOSCOPY  07/12/2020   COLONOSCOPY WITH PROPOFOL N/A 07/12/2020   Procedure: COLONOSCOPY WITH PROPOFOL;  Surgeon: Charlott Rakes, MD;  Location[facility] ENDOSCOPY;  Service: Endoscopy;  Laterality: N/A;   ESOPHAGOGASTRODUODENOSCOPY  07/12/2020   ESOPHAGOGASTRODUODENOSCOPY N/A 07/12/2020   Procedure: ESOPHAGOGASTRODUODENOSCOPY (  EGD);  Surgeon: Wilford Corner, MD;  Location: [place];  Service: Endoscopy;  Laterality: N/A;   ESOPHAGOGASTRODUODENOSCOPY N/A 04/19/2021   Procedure: ESOPHAGOGASTRODUODENOSCOPY (EGD);  Surgeon: Wilford Corner, MD;  Location: [facility] ENDOSCOPY;  Service: Endoscopy;  Laterality: N/A;   HOT HEMOSTASIS N/A 07/12/2020   Procedure: HOT HEMOSTASIS (ARGON PLASMA COAGULATION/BICAP);  Surgeon: Wilford Corner, MD;  Location: [place];  Service: Endoscopy;  Laterality: N/A;  EGD and COLON    SOCIAL HISTORY: Social History   Socioeconomic History   Marital status: Widowed    Spouse name: Not on file   Number of children: 4   Years of education: Not on file   Highest education level: Not on file  Occupational History   Not on file  Tobacco Use   Smoking status: Former    Packs/day: 1.00    Years: 29.00    Pack years: 29.00    Types: Cigarettes    Quit date: 74    Years since quitting: 29.8   Smokeless tobacco: Never   Tobacco comments:    SMOKED FOR 29 YEARS   Vaping Use   Vaping Use: Never used  Substance and Sexual Activity   Alcohol use: No   Drug use: No   Sexual activity: Not on file  Other Topics Concern   Not on file  Social History Narrative   Not on file   Social Determinants of Health   Financial Resource Strain: Not on file  Food Insecurity: Not on file  Transportation Needs: Not on file  Physical Activity: Not on file  Stress: Not on file  Social Connections: Not on file  Intimate Partner Violence: Not on file    FAMILY HISTORY: Family History  Problem Relation Age of Onset   Anemia Mother    Stroke Mother    Anemia Daughter     ALLERGIES:  has No Known Allergies.  MEDICATIONS:  Current Outpatient Medications  Medication Sig Dispense Refill   amLODipine (NORVASC) 5 MG tablet Take 1 tablet (5 mg total) by mouth daily. 30 tablet 3   Ferrous Sulfate Dried (HIGH POTENCY IRON) 65 MG TABS Fusion Plus 130 mg iron-1,250 mcg capsule  Take 1 capsule every day by oral route.     furosemide (LASIX) 40 MG tablet Take 1 tablet (40 mg total) by mouth daily. 90 tablet 3   linaclotide (LINZESS) 145 MCG CAPS capsule Linzess 145 mcg capsule  Take 1 capsule every day by oral route.     metFORMIN (GLUCOPHAGE-XR) 500 MG 24 hr tablet TAKE 1 TABLET(500 MG) BY MOUTH DAILY WITH BREAKFAST 90 tablet 3   metoprolol succinate (TOPROL-XL) 100 MG 24 hr tablet Take 1 tablet (100 mg total) by  mouth at bedtime. Take with or immediately following a meal. 90 tablet 3   Potassium Chloride ER 20 MEQ TBCR Take 20 mEq by mouth daily. 90 tablet 3   rosuvastatin (CRESTOR) 10 MG tablet Take 1 tablet (10 mg total) by mouth daily. 90 tablet 3   triamcinolone cream (KENALOG) 0.1 % Apply 1 application topically every 8 (eight) hours as needed (itching).      valsartan (DIOVAN) 80 MG tablet Take 1 tablet by mouth daily.     No current facility-administered medications for this visit.    REVIEW OF SYSTEMS:   Constitutional: ( - ) fevers, ( - )  chills , ( - ) night sweats Eyes: ( - ) blurriness of vision, ( - ) double vision, ( - ) watery eyes Ears,  nose, mouth, throat, and face: ( - ) mucositis, ( - ) sore throat Respiratory: ( - ) cough, ( - ) dyspnea, ( - ) wheezes Cardiovascular: ( - ) palpitation, ( - ) chest discomfort, ( - ) lower extremity swelling Gastrointestinal:  ( - ) nausea, ( - ) heartburn, ( - ) change in bowel habits Skin: ( - ) abnormal skin rashes Lymphatics: ( - ) new lymphadenopathy, ( - ) easy bruising Neurological: ( - ) numbness, ( - ) tingling, ( - ) new weaknesses Behavioral/Psych: ( - ) mood change, ( - ) new changes  All other systems were reviewed with the patient and are negative.  PHYSICAL EXAMINATION: ECOG PERFORMANCE STATUS: 1 - Symptomatic but completely ambulatory  Vitals:   10/17/21 1259  BP: (!) 163/84  Pulse: 85  Resp: 17  Temp: (!) 96.8 F (36 C)  SpO2: 100%   Filed Weights   10/17/21 1259  Weight: 138 lb 6.4 oz (62.8 kg)    GENERAL: well appearing elderly African-American female in NAD  SKIN: skin color, texture, turgor are normal, no rashes or significant lesions EYES: conjunctiva are pink and non-injected, sclera clear LUNGS: clear to auscultation and percussion with normal breathing effort HEART: regular rate & rhythm and no murmurs and no lower extremity edema Musculoskeletal: no cyanosis of digits and no clubbing  PSYCH: alert &  oriented x 3, fluent speech NEURO: no focal motor/sensory deficits  LABORATORY DATA:  I have reviewed the data as listed CBC Latest Ref Rng & Units 04/20/2021 04/19/2021 04/18/2021  WBC 4.0 - 10.5 K/uL 6.7 - -  Hemoglobin 12.0 - 15.0 g/dL 7.9(L) 7.5(L) 7.1(L)  Hematocrit 36.0 - 46.0 % 26.2(L) 22.0(L) 22.9(L)  Platelets 150 - 400 K/uL 263 - -    CMP Latest Ref Rng & Units 04/20/2021 04/19/2021 04/19/2021  Glucose 70 - 99 mg/dL 133(H) 106(H) 109(H)  BUN 8 - 23 mg/dL 17 14 19   Creatinine 0.44 - 1.00 mg/dL 1.33(H) 1.10(H) 1.26(H)  Sodium 135 - 145 mmol/L 138 138 138  Potassium 3.5 - 5.1 mmol/L 4.2 3.5 3.4(L)  Chloride 98 - 111 mmol/L 106 106 105  CO2 22 - 32 mmol/L 23 - 23  Calcium 8.9 - 10.3 mg/dL 9.1 - 8.9  Total Protein 6.5 - 8.1 g/dL - - 6.2(L)  Total Bilirubin 0.3 - 1.2 mg/dL - - 1.0  Alkaline Phos 38 - 126 U/L - - 112  AST 15 - 41 U/L - - 34  ALT 0 - 44 U/L - - 39    RADIOGRAPHIC STUDIES: No results found.  ASSESSMENT & PLAN Jasmin Miller 80 y.o. female with medical history significant for congestive heart failure, hypertension, hyperlipidemia, type 2 diabetes, and constipation who presents for evaluation of iron deficiency anemia of unclear etiology.  After review of the labs, review of the records, and discussion with the patient the patients findings are most consistent with iron deficiency anemia secondary to GI bleed.  We are currently working towards getting the records from Dr. Michail Sermon in order to confirm that GI is a source of her bleeding.  Her records are highly consistent with iron deficiency anemia that is not responding to p.o. iron therapy.  Therefore we will plan to proceed with IV iron treatment and see her back in approximately 4 to 6 weeks after last dose in order to assure it is effective.  The patient voiced understanding of this plan moving forward.  # Iron Deficiency Anemia of Unclear Etiology -- Prior  labs consistent with iron deficiency anemia refractory to  iron therapy p.o. --Today we will repeat CBC, CMP, ferritin, iron panel --Plan to proceed with IV Feraheme 510 mg q. 7 days x 2 doses.  If this is not approved we will plan to proceed with IV Venofer instead. --We are in the process of tracking down her GI records. --Plan to have the patient return to clinic approximately 4 to 6 weeks after last dose of IV iron.  Orders Placed This Encounter  Procedures   CBC with Differential ([place] Only)    Standing Status:   Future    Number of Occurrences:   1    Standing Expiration Date:   10/17/2022   CMP ([place] only)    Standing Status:   Future    Number of Occurrences:   1    Standing Expiration Date:   10/17/2022   Iron and TIBC    Standing Status:   Future    Number of Occurrences:   1    Standing Expiration Date:   10/17/2022   Ferritin    Standing Status:   Future    Number of Occurrences:   1    Standing Expiration Date:   10/17/2022   Retic Panel    Standing Status:   Future    Number of Occurrences:   1    Standing Expiration Date:   10/17/2022    All questions were answered. The patient knows to call the clinic with any problems, questions or concerns.  A total of more than 60 minutes were spent on this encounter with face-to-face time and non-face-to-face time, including preparing to see the patient, ordering tests and/or medications, counseling the patient and coordination of care as outlined above.   Ledell Peoples, MD Department of Hematology/Oncology [facility] Phone: [phone] Pager: [phone] Email: [email or web address]  10/17/2021 1:57 PM

## 2021-10-17 NOTE — Progress Notes (Signed)
Medical records requested from Dr. Marge Duncans office

## 2021-10-21 ENCOUNTER — Telehealth: Payer: Self-pay | Admitting: Hematology & Oncology

## 2021-10-21 ENCOUNTER — Telehealth: Payer: Self-pay | Admitting: Hematology and Oncology

## 2021-10-21 NOTE — Telephone Encounter (Signed)
Sch per 11/10 los, left msg, pt decliend other location appts

## 2021-10-25 MED FILL — Ferumoxytol Inj 510 MG/17ML (30 MG/ML) (Elemental Fe): INTRAVENOUS | Qty: 17 | Status: AC

## 2021-10-26 ENCOUNTER — Inpatient Hospital Stay: Payer: Medicare Other

## 2021-10-26 ENCOUNTER — Other Ambulatory Visit: Payer: Self-pay

## 2021-10-26 VITALS — BP 147/76 | HR 70 | Temp 98.0°F | Resp 17

## 2021-10-26 DIAGNOSIS — D509 Iron deficiency anemia, unspecified: Secondary | ICD-10-CM | POA: Diagnosis not present

## 2021-10-26 DIAGNOSIS — D500 Iron deficiency anemia secondary to blood loss (chronic): Secondary | ICD-10-CM

## 2021-10-26 MED ORDER — SODIUM CHLORIDE 0.9 % IV SOLN
510.0000 mg | Freq: Once | INTRAVENOUS | Status: AC
  Administered 2021-10-26: 510 mg via INTRAVENOUS
  Filled 2021-10-26: qty 510

## 2021-10-26 MED ORDER — SODIUM CHLORIDE 0.9 % IV SOLN
Freq: Once | INTRAVENOUS | Status: AC
Start: 2021-10-26 — End: 2021-10-26

## 2021-10-26 NOTE — Patient Instructions (Signed)

## 2021-11-01 MED FILL — Ferumoxytol Inj 510 MG/17ML (30 MG/ML) (Elemental Fe): INTRAVENOUS | Qty: 17 | Status: AC

## 2021-11-02 ENCOUNTER — Inpatient Hospital Stay: Payer: Medicare Other

## 2021-11-09 ENCOUNTER — Other Ambulatory Visit: Payer: Self-pay

## 2021-11-09 ENCOUNTER — Inpatient Hospital Stay: Payer: Medicare Other | Attending: Hematology and Oncology

## 2021-11-09 VITALS — BP 163/83 | HR 67 | Temp 99.0°F | Resp 16

## 2021-11-09 DIAGNOSIS — Z79899 Other long term (current) drug therapy: Secondary | ICD-10-CM | POA: Insufficient documentation

## 2021-11-09 DIAGNOSIS — Z87891 Personal history of nicotine dependence: Secondary | ICD-10-CM | POA: Diagnosis not present

## 2021-11-09 DIAGNOSIS — D509 Iron deficiency anemia, unspecified: Secondary | ICD-10-CM | POA: Diagnosis not present

## 2021-11-09 DIAGNOSIS — D500 Iron deficiency anemia secondary to blood loss (chronic): Secondary | ICD-10-CM

## 2021-11-09 MED ORDER — SODIUM CHLORIDE 0.9 % IV SOLN: Freq: Once | INTRAVENOUS | Status: AC

## 2021-11-09 MED ORDER — SODIUM CHLORIDE 0.9 % IV SOLN
510.0000 mg | Freq: Once | INTRAVENOUS | Status: AC
  Administered 2021-11-09: 510 mg via INTRAVENOUS
  Filled 2021-11-09: qty 17

## 2021-11-09 NOTE — Patient Instructions (Signed)

## 2021-12-12 ENCOUNTER — Encounter: Payer: Self-pay | Admitting: Hematology and Oncology

## 2021-12-19 ENCOUNTER — Ambulatory Visit: Payer: Medicare Other | Admitting: Hematology and Oncology

## 2021-12-19 ENCOUNTER — Other Ambulatory Visit: Payer: Medicare Other

## 2021-12-22 ENCOUNTER — Other Ambulatory Visit: Payer: Self-pay | Admitting: Student

## 2022-07-09 ENCOUNTER — Other Ambulatory Visit: Payer: Self-pay | Admitting: Student

## 2022-09-01 IMAGING — CR DG CHEST 2V
2 series · 2 of 2 positions shown · non-contrast
Comparison: 07/10/2020

CLINICAL DATA: Shortness of breath

EXAM:
CHEST - 2 VIEW

[w chest pa]
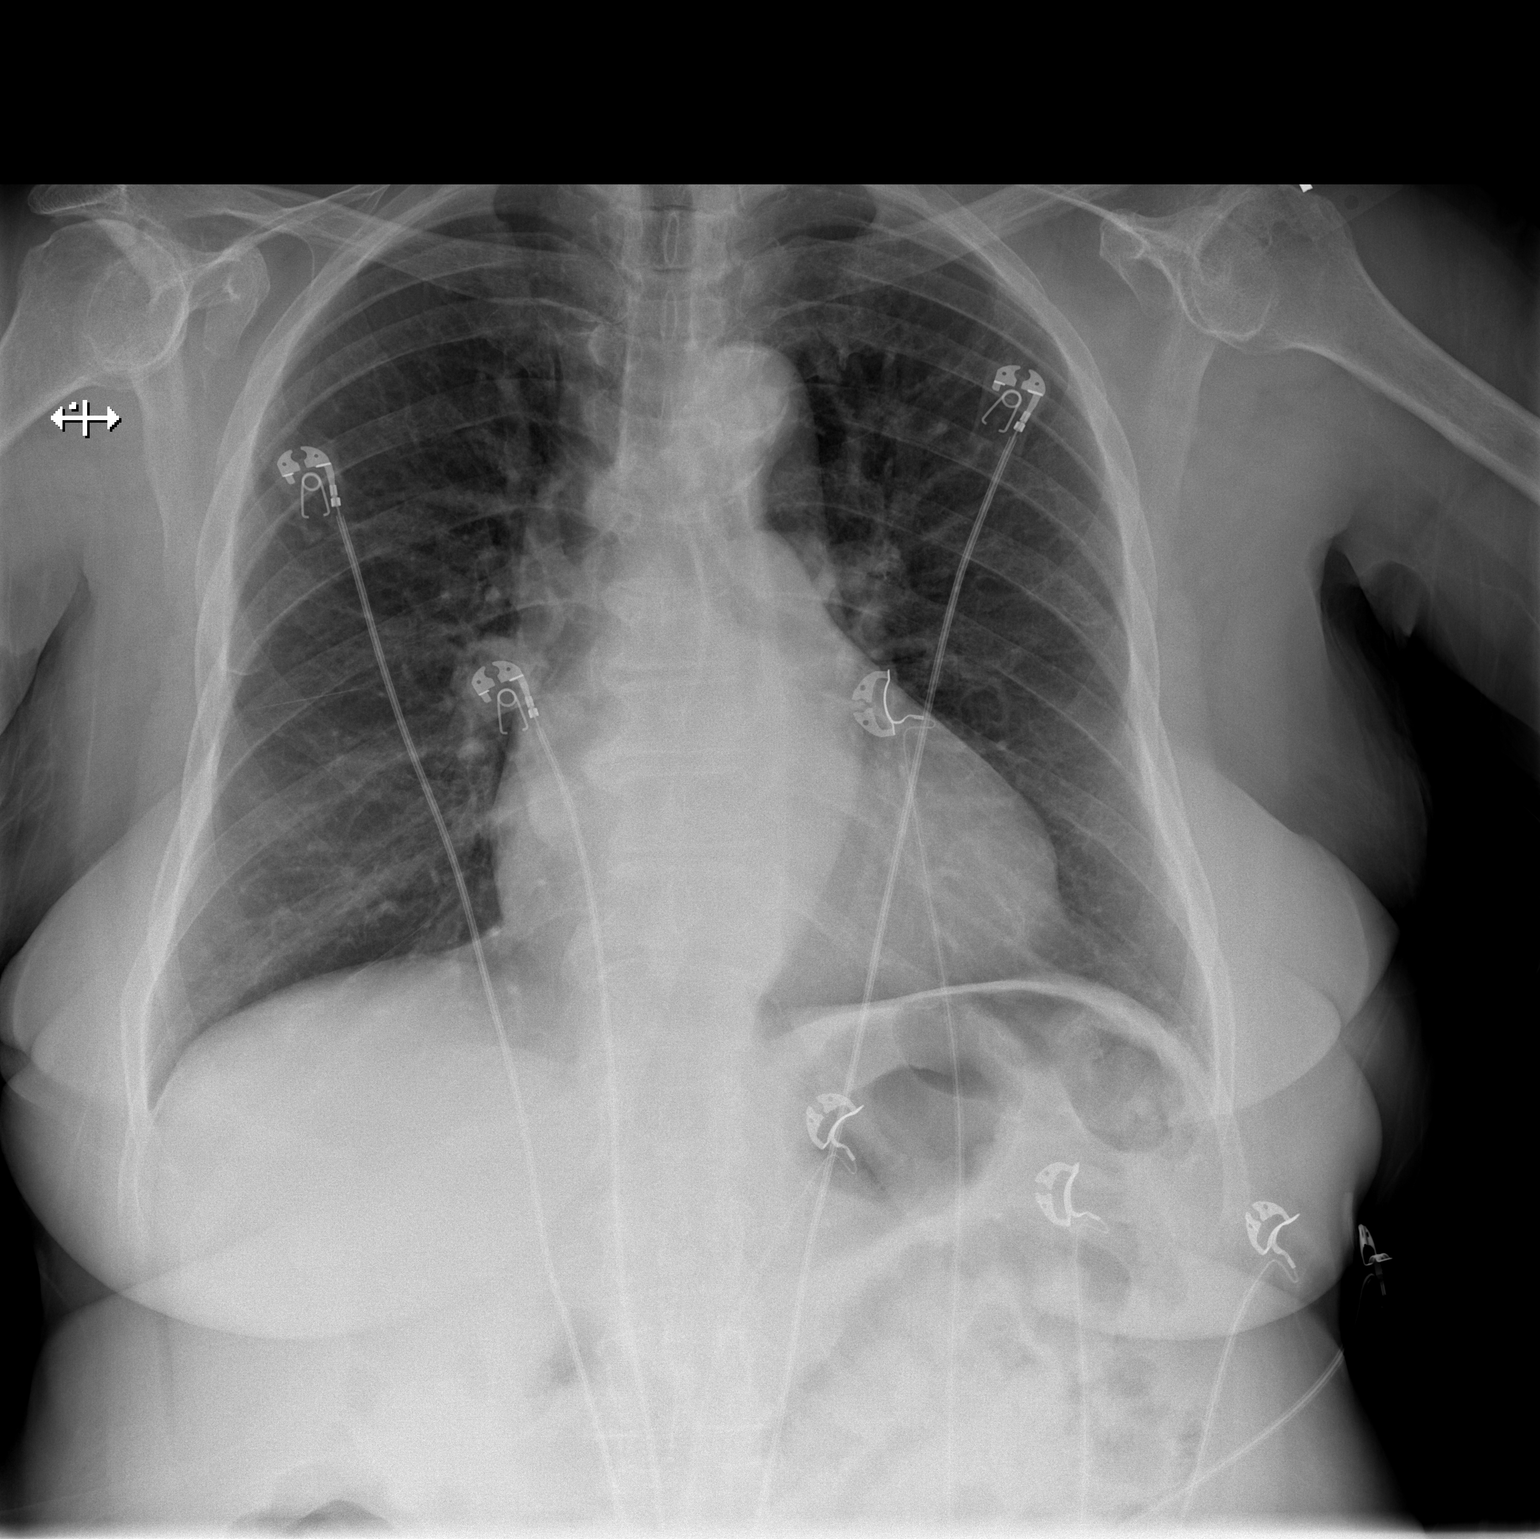

[w chest lat]
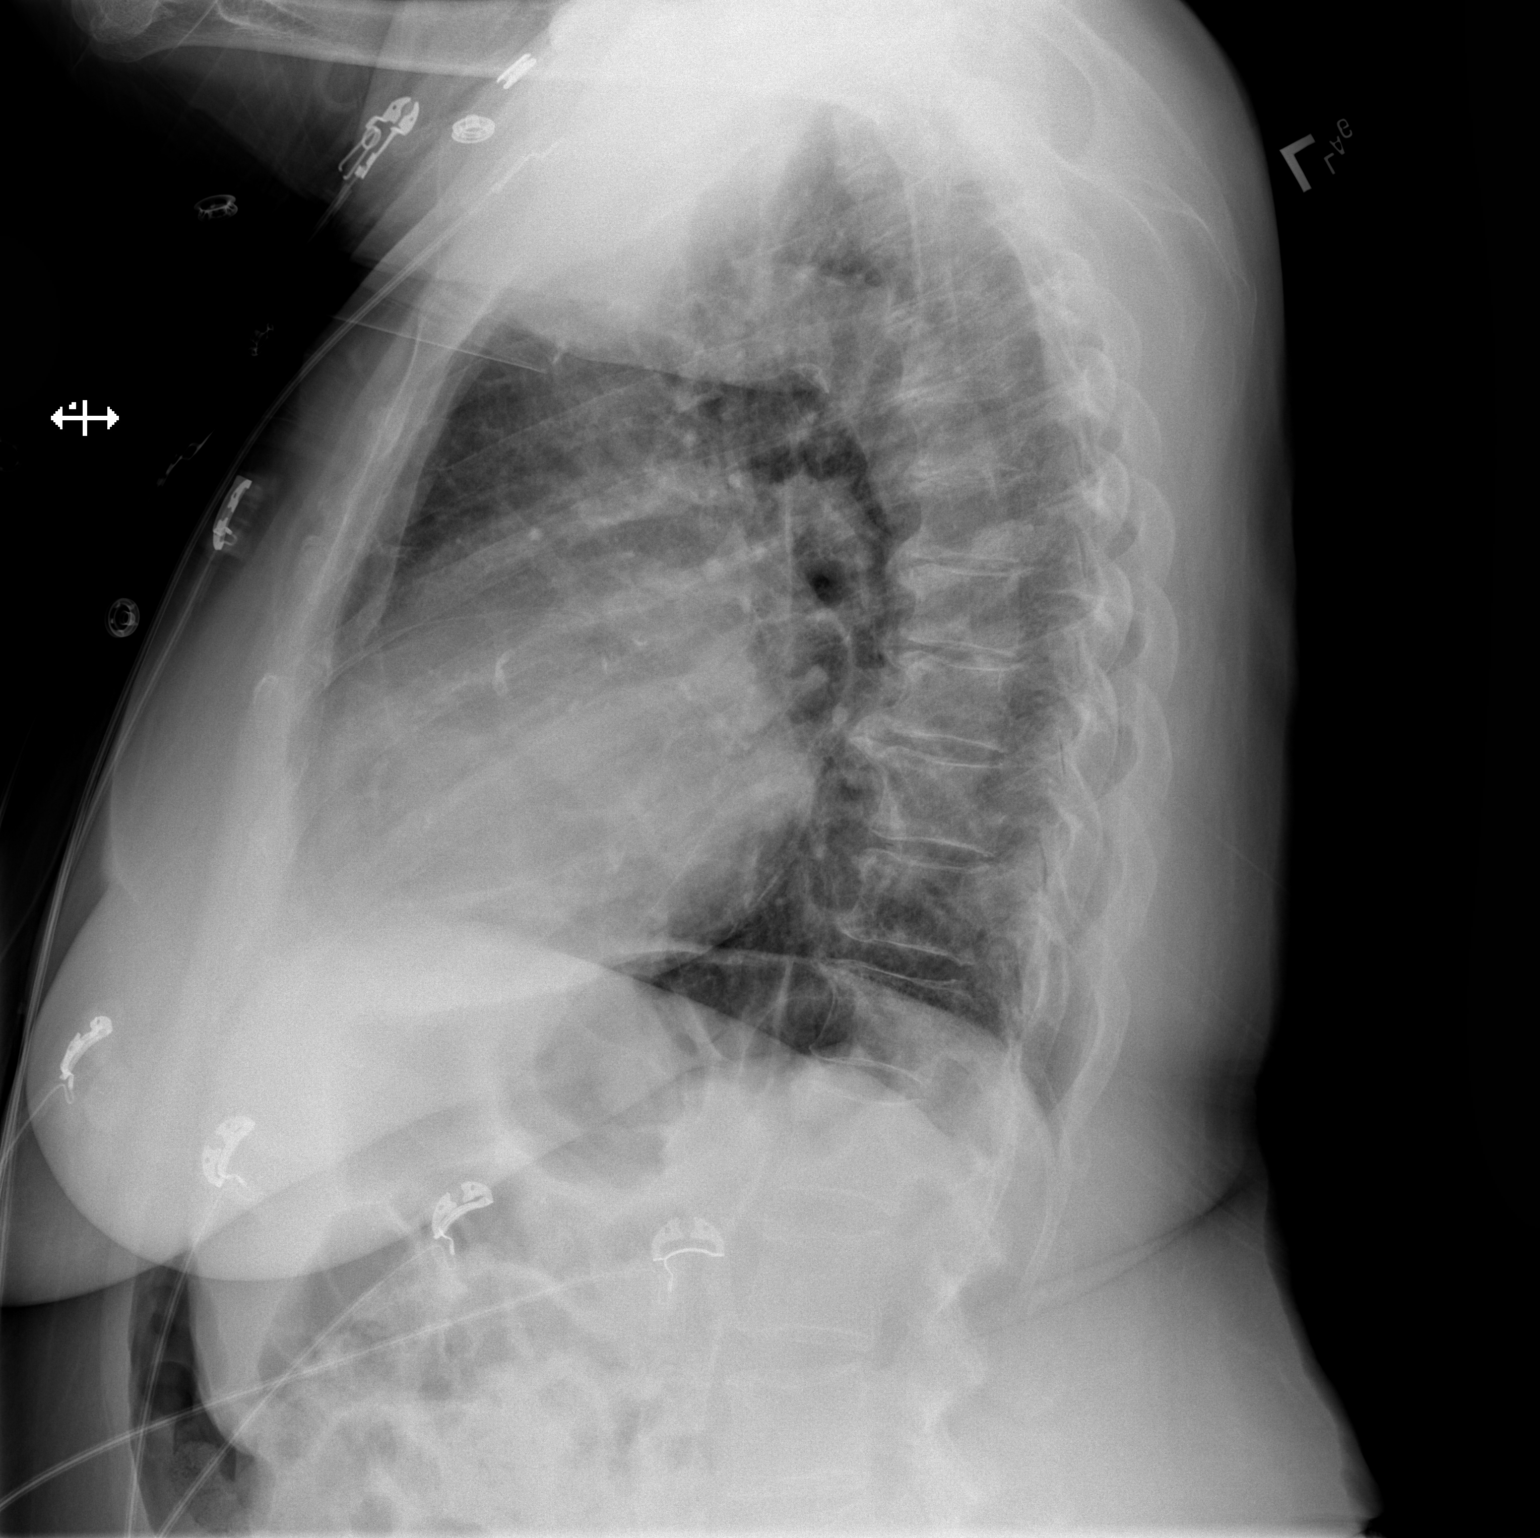

[2 of 2 positions shown; findings below may reference images not displayed]

FINDINGS: No consolidation or edema. No pleural effusion or pneumothorax.
Cardiomediastinal contours are within normal limits. No acute
osseous abnormality.
IMPRESSION: No acute process in the chest.

## 2023-05-22 ENCOUNTER — Emergency Department (HOSPITAL_BASED_OUTPATIENT_CLINIC_OR_DEPARTMENT_OTHER)
Admission: EM | Admit: 2023-05-22 | Discharge: 2023-05-22 | Discharge status: Against Medical Advice | Payer: 59 | Attending: Emergency Medicine | Admitting: Emergency Medicine

## 2023-05-22 ENCOUNTER — Encounter (HOSPITAL_BASED_OUTPATIENT_CLINIC_OR_DEPARTMENT_OTHER): Payer: Self-pay

## 2023-05-22 ENCOUNTER — Encounter: Payer: Self-pay | Admitting: Hematology and Oncology

## 2023-05-22 ENCOUNTER — Ambulatory Visit: Payer: Self-pay

## 2023-05-22 ENCOUNTER — Emergency Department (HOSPITAL_BASED_OUTPATIENT_CLINIC_OR_DEPARTMENT_OTHER): Payer: 59 | Admitting: Radiology

## 2023-05-22 DIAGNOSIS — E119 Type 2 diabetes mellitus without complications: Secondary | ICD-10-CM | POA: Diagnosis not present

## 2023-05-22 DIAGNOSIS — I10 Essential (primary) hypertension: Secondary | ICD-10-CM | POA: Diagnosis not present

## 2023-05-22 DIAGNOSIS — Z5329 Procedure and treatment not carried out because of patient's decision for other reasons: Secondary | ICD-10-CM | POA: Diagnosis not present

## 2023-05-22 DIAGNOSIS — R0602 Shortness of breath: Secondary | ICD-10-CM | POA: Diagnosis present

## 2023-05-22 LAB — BASIC METABOLIC PANEL
Anion gap: 11 (ref 5–15)
BUN: 11 mg/dL (ref 8–23)
CO2: 19 mmol/L — ABNORMAL LOW (ref 22–32)
Calcium: 8.7 mg/dL — ABNORMAL LOW (ref 8.9–10.3)
Chloride: 108 mmol/L (ref 98–111)
Creatinine, Ser: 0.98 mg/dL (ref 0.44–1.00)
GFR, Estimated: 58 mL/min — ABNORMAL LOW (ref >60)
Glucose, Bld: 97 mg/dL (ref 70–99)
Potassium: 3.5 mmol/L (ref 3.5–5.1)
Sodium: 138 mmol/L (ref 135–145)

## 2023-05-22 MED ORDER — ALBUTEROL SULFATE HFA 108 (90 BASE) MCG/ACT IN AERS: 2.0000 | INHALATION_SPRAY | RESPIRATORY_TRACT | Status: DC | PRN

## 2023-05-22 NOTE — ED Notes (Signed)
Pt requested an update on why it was taking so long... Pt informed that we were waiting on the blood work to come back... The provider would then return, discuss what was found and then the next steps... Pt became irritated... Saying that its already been 2 hours since she arrived and wanted to go home/eat food... Also stating that nothing takes this long... Pt informed that it is a hospital and it is out of my control on how long it takes... Pt became even more irritated and stated she was leaving... While removing her leads and SpO2...  Pt changed her mind and wanted to stay... Everything was put back on... Provider informed.Marland KitchenMarland Kitchen

## 2023-05-22 NOTE — ED Triage Notes (Signed)
Pt c/o SHOB onset yesterday, worse w exertion. Associated weakness, but denies CP. Advises hx "irregular HR," states that she has had "blood transfusion" approx 2mos ago for similar symptoms "& it made me feel better." Hx anemia

## 2023-05-22 NOTE — Telephone Encounter (Signed)
Chief Complaint: SOB Symptoms: No other symptoms Frequency: 2 days Pertinent Negatives: Patient denies N/A Disposition: [x] ED /[] Urgent Care (no appt availability in office) / [] Appointment(In office/virtual)/ []  [facility]/ [] Home Care/ [] Refused Recommended Disposition /[] [facility]/ []  Follow-up with PCP Additional Notes: Patient says she's SOB walking from one room to the other in her house x 2 days, saw her PCP and was advised to call [facility] to have a doctor to see her. She says her PCP office was booked. She says the last time this happened she needed a blood transfusion. Advised ED, she verbalized understanding.   Reason for Disposition  [1] MODERATE difficulty breathing (e.g., speaks in phrases, SOB even at rest, pulse 100-120) AND [2] NEW-onset or WORSE than normal  Answer Assessment - Initial Assessment Questions 1. RESPIRATORY STATUS: "Describe your breathing?" (e.g., wheezing, shortness of breath, unable to speak, severe coughing)      SOB 2. ONSET: "When did this breathing problem begin?"      2 days ago 3. PATTERN "Does the difficult breathing come and go, or has it been constant since it started?"      Constant 4. SEVERITY: "How bad is your breathing?" (e.g., mild, moderate, severe)    - MILD: No SOB at rest, mild SOB with walking, speaks normally in sentences, can lie down, no retractions, pulse < 100.    - MODERATE: SOB at rest, SOB with minimal exertion and prefers to sit, cannot lie down flat, speaks in phrases, mild retractions, audible wheezing, pulse 100-120.    - SEVERE: Very SOB at rest, speaks in single words, struggling to breathe, sitting hunched forward, retractions, pulse > 120      Moderate 5. RECURRENT SYMPTOM: "Have you had difficulty breathing before?" If Yes, ask: "When was the last time?" and "What happened that time?"      Yes had to have a blood transfusion 6. CARDIAC HISTORY: "Do you have any history of heart disease?"  (e.g., heart attack, angina, bypass surgery, angioplasty)      No 7. LUNG HISTORY: "Do you have any history of lung disease?"  (e.g., pulmonary embolus, asthma, emphysema)     N/A 8. CAUSE: "What do you think is causing the breathing problem?"      May need a blood transfusion 9. OTHER SYMPTOMS: "Do you have any other symptoms? (e.g., dizziness, runny nose, cough, chest pain, fever)     No  Protocols used: Breathing Difficulty-A-AH

## 2023-05-22 NOTE — ED Provider Notes (Cosign Needed Addendum)
[place] EMERGENCY DEPARTMENT AT [facility] Provider Note   CSN: [id number] Arrival date & time: 05/22/23  1552     History  Chief Complaint  Patient presents with   Shortness of Breath    Jasmin Miller is a 82 y.o. female with PMHx DM, HTN, MI (1994) who presents to ED complaining of DOE since yesterday. States that this has happened before when she was anemic and required a blood transfusion. Patient stating that symptoms happen when she is walking around her house and resolves with rest.  Denies fever, chest pain, abdominal pain, nausea, vomiting, diarrhea. Denies lower leg edema, leg swelling, calf tenderness, hx blood clots, hemoptysis, recent surgery/trauma/immobilization.   Shortness of Breath      Home Medications Prior to Admission medications   Medication Sig Start Date End Date Taking? Authorizing Provider  amLODipine (NORVASC) 5 MG tablet TAKE 1 TABLET(5 MG) BY MOUTH DAILY 12/23/21   Cantwell, Celeste C, PA-C  Ferrous Sulfate Dried (HIGH POTENCY IRON) 65 MG TABS Fusion Plus 130 mg iron-1,250 mcg capsule  Take 1 capsule every day by oral route.    [provider]  furosemide (LASIX) 40 MG tablet Take 1 tablet (40 mg total) by mouth daily. 09/11/21   Cantwell, Celeste C, PA-C  linaclotide (LINZESS) 145 MCG CAPS capsule Linzess 145 mcg capsule  Take 1 capsule every day by oral route.    [provider]  metFORMIN (GLUCOPHAGE-XR) 500 MG 24 hr tablet TAKE 1 TABLET(500 MG) BY MOUTH DAILY WITH BREAKFAST 09/11/21   Cantwell, Celeste C, PA-C  metoprolol succinate (TOPROL-XL) 100 MG 24 hr tablet Take 1 tablet (100 mg total) by mouth at bedtime. Take with or immediately following a meal. 08/23/21   Cantwell, Celeste C, PA-C  Potassium Chloride ER 20 MEQ TBCR Take 20 mEq by mouth daily. 08/23/21   Cantwell, Celeste C, PA-C  rosuvastatin (CRESTOR) 10 MG tablet Take 1 tablet (10 mg total) by mouth daily. 08/23/21   Cantwell, Celeste C, PA-C  triamcinolone  cream (KENALOG) 0.1 % Apply 1 application topically every 8 (eight) hours as needed (itching).     [provider]  valsartan (DIOVAN) 80 MG tablet Take 1 tablet by mouth daily.    [provider]      Allergies    Patient has no known allergies.    Review of Systems   Review of Systems  Respiratory:  Positive for shortness of breath.     Physical Exam Updated Vital Signs BP 138/66 (BP Location: Right Arm)   Pulse 100   Resp (!) 21   SpO2 97%  Physical Exam Vitals and nursing note reviewed.  Constitutional:      General: She is not in acute distress.    Appearance: She is not ill-appearing, toxic-appearing or diaphoretic.  HENT:     Head: Normocephalic and atraumatic.     Mouth/Throat:     Mouth: Mucous membranes are moist.     Pharynx: No oropharyngeal exudate or posterior oropharyngeal erythema.  Eyes:     General: No scleral icterus.       Right eye: No discharge.        Left eye: No discharge.     Conjunctiva/sclera: Conjunctivae normal.  Cardiovascular:     Rate and Rhythm: Normal rate and regular rhythm.     Pulses: Normal pulses.     Heart sounds: No murmur heard.    Comments: +2 pedal and radial pulses. Pulmonary:     Effort: Pulmonary  effort is normal. No tachypnea or respiratory distress.     Breath sounds: Normal breath sounds. No wheezing, rhonchi or rales.  Abdominal:     Tenderness: There is no abdominal tenderness.  Musculoskeletal:     Right lower leg: No edema.     Left lower leg: No edema.  Skin:    General: Skin is warm and dry.     Findings: No rash.  Neurological:     General: No focal deficit present.     Mental Status: She is alert. Mental status is at baseline.  Psychiatric:        Mood and Affect: Mood normal.        Behavior: Behavior normal.     ED Results / Procedures / Treatments   Labs (all labs ordered are listed, but only abnormal results are displayed) Labs Reviewed  BASIC METABOLIC PANEL - Abnormal;  Notable for the following components:      Result Value   CO2 19 (*)    Calcium 8.7 (*)    GFR, Estimated 58 (*)    All other components within normal limits  CBC WITH DIFFERENTIAL/PLATELET  CBC WITH DIFFERENTIAL/PLATELET    EKG None  Radiology DG Chest 2 View  Result Date: 05/22/2023 CLINICAL DATA:  shob EXAM: CHEST - 2 VIEW COMPARISON:  CXR 04/17/21 FINDINGS: The heart size and mediastinal contours are within normal limits. Both lungs are clear. The visualized skeletal structures are unremarkable. IMPRESSION: No acute pulmonary abnormality Electronically Signed   By: Lorenza Cambridge M.D.   On: 05/22/2023 16:39    Procedures Procedures    Medications Ordered in ED Medications  albuterol (VENTOLIN HFA) 108 (90 Base) MCG/ACT inhaler 2 puff (has no administration in time range)    ED Course/ Medical Decision Making/ A&P                             Medical Decision Making Amount and/or Complexity of Data Reviewed Labs: ordered. Radiology: ordered.  Risk Prescription drug management.   This patient presents to the ED for concern of shortness of breath, this involves an extensive number of treatment options, and is a complaint that carries with it a high risk of complications and morbidity.  The differential diagnosis includes Anxiety, Anaphylaxis/Angioedema, Aspirated FB, Arrhythmia, CHF, Asthma, COPD, PNA, COVID/Flu/RSV, STEMI, Tamponade, TPNX, DKA, Sepsis, Toxin   Co morbidities that complicate the patient evaluation  DM, HTN, MI (1994); symptomatic anemia      Lab Tests:  I Ordered, and personally interpreted labs.  The pertinent results include:  - BMP: no concern for electrolyte abnormality; no concern for kidney damage - CBC: pending - patient left AMA before resulting    Imaging Studies ordered:  I ordered imaging studies including  -chest xray: no acute cardiopulmonary disease I independently visualized and interpreted imaging I agree with the  radiologist interpretation   Cardiac Monitoring: / EKG:  The patient was maintained on a cardiac monitor.  I personally viewed and interpreted the cardiac monitored which showed an underlying rhythm of: unifocal PVC's    Problem List / ED Course / Critical interventions / Medication management  Patient presents to ED concerned for SOB x1 day. Patient stating that these symptoms happen whenever she is anemic. Chest xray without concern. EKG showing PVC's. While awaiting blood work to assess patient's blood count - patient shared with nurse that she would like to leave because she had waited 2 hours  in her ED room and had not received her lab results. Nurse shared with patient that we were waiting for her blood work to result so that we could further assess her. Patient stating that she was tired of waiting and wanted to leave AMA. Nurse asked patient to wait until a provider could see her before she left AMA. Patient refused to wait for provider, and left AMA. Patient understands that his/her actions will lead to inadequate medical workup, and that he/she is at risk of complications of missed diagnosis, which includes morbidity and mortality. Patient is demonstrating good capacity to make decision. Patient understands that he/she needs to return to the ER immediately if his/her symptoms get worse. I have reviewed the patients home medicines and have made adjustments as needed    Social Determinants of Health:  none           Final Clinical Impression(s) / ED Diagnoses Final diagnoses:  Shortness of breath    Rx / DC Orders ED Discharge Orders     None         Margarita Rana 05/22/23 1925    Dorthy Cooler, [place] 05/22/23 2331    Vanetta Mulders, MD 05/25/23 1216

## 2023-05-22 NOTE — ED Notes (Signed)
Pt called out and requested to talk to someone.. I arrived and the Pt again stated that there is no reason that it is taking this long and wanted to leave... Pt again informed of what we were waiting on... Pt stated that she was leaving and wanted the EKG, SpO2 and BP removed... All of the above were removed... Pt would not wait to see the PA or listen about leaving AMA... 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 Although AMA was told to the Pt but the Pt would not listen... Pt informed that she was leaving AMA, she was told without the full report of blood, talking to the PA again that anything up to including death could occur... 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 Pt left ambulatory towards the lobby to wait for her relative.Jasmin KitchenMarland Miller

## 2023-06-19 ENCOUNTER — Encounter (HOSPITAL_BASED_OUTPATIENT_CLINIC_OR_DEPARTMENT_OTHER): Payer: Self-pay | Admitting: Emergency Medicine

## 2023-06-19 ENCOUNTER — Emergency Department (HOSPITAL_BASED_OUTPATIENT_CLINIC_OR_DEPARTMENT_OTHER)
Admission: EM | Admit: 2023-06-19 | Discharge: 2023-06-19 | Disposition: A | Discharge status: Home / Self Care | Payer: 59 | Attending: Emergency Medicine | Admitting: Emergency Medicine

## 2023-06-19 ENCOUNTER — Other Ambulatory Visit: Payer: Self-pay

## 2023-06-19 DIAGNOSIS — M7989 Other specified soft tissue disorders: Secondary | ICD-10-CM | POA: Insufficient documentation

## 2023-06-19 DIAGNOSIS — L299 Pruritus, unspecified: Secondary | ICD-10-CM | POA: Diagnosis not present

## 2023-06-19 DIAGNOSIS — R21 Rash and other nonspecific skin eruption: Secondary | ICD-10-CM

## 2023-06-19 MED ORDER — PREDNISONE 20 MG PO TABS: ORAL_TABLET | ORAL | 0 refills | Status: DC

## 2023-06-19 MED ORDER — PREDNISONE 50 MG PO TABS
60.0000 mg | ORAL_TABLET | Freq: Once | ORAL | Status: AC
  Administered 2023-06-19: 60 mg via ORAL
  Filled 2023-06-19: qty 1

## 2023-06-19 NOTE — Discharge Instructions (Signed)
I am starting you on steroids.  Hopefully this helps you with your rash and itching.  Please follow-up with the family doctor in the office.  If this does not improve then they need to refer you to a dermatologist.  You should try to elevate your legs whenever you are not up and moving around.  Please follow-up with your cardiologist and asked them about your foot swelling.  They may want to change your diuretic.  I reviewed your lab work from your last ER visit.  Luckily everything looked reassuring.

## 2023-06-19 NOTE — ED Provider Notes (Signed)
[facility] EM[facility] DE[facility] AT [facility] Provider Note   CSN: [id number] Arrival date & time: 06/19/23  1311     History  Chief Complaint  Patient presents with   Pruritis   Leg Swelling   Rash    Jasmin Miller is a 82 y.o. female.  82 yo F with a chief complaints of a rash to the upper back and to her left elbow.  Has been going on for couple weeks.  Tells me she was seen in the ER setting about 2 weeks ago.  She tells me they did nothing for her.  She was started on a steroid cream which she says has not improved her symptoms at all.  She tells me that she went to see her family doctor today and they could not get her in and told her to come to the emergency department for evaluation.  She tells me that maybe she saw her doctor a couple weeks ago but she is not sure what they thought the rash.  She does not like having to wait and thinks that she is waited too long already.  She does not know why her doctor sent her here.  She denies any difficulty breathing denies cough or congestion.  She denies any new soaps or detergents or lotions.  Denies any recent new hair products.   Rash      Home Medications Prior to Admission medications   Medication Sig Start Date End Date Taking? Authorizing Provider  predniSONE (DELTASONE) 20 MG tablet 2 tabs po daily x 4 days 06/19/23  Yes Melene Plan, DO  amLODipine (NORVASC) 5 MG tablet TAKE 1 TABLET(5 MG) BY MOUTH DAILY 12/23/21   Cantwell, Celeste C, PA-C  Ferrous Sulfate Dried (HIGH POTENCY IRON) 65 MG TABS Fusion Plus 130 mg iron-1,250 mcg capsule  Take 1 capsule every day by oral route.    [provider]  furosemide (LASIX) 40 MG tablet Take 1 tablet (40 mg total) by mouth daily. 09/11/21   Cantwell, Celeste C, PA-C  linaclotide (LINZESS) 145 MCG CAPS capsule Linzess 145 mcg capsule  Take 1 capsule every day by oral route.    [provider]  metFORMIN (GLUCOPHAGE-XR) 500 MG 24 hr tablet TAKE 1 TABLET(500  MG) BY MOUTH DAILY WITH BREAKFAST 09/11/21   Cantwell, Celeste C, PA-C  metoprolol succinate (TOPROL-XL) 100 MG 24 hr tablet Take 1 tablet (100 mg total) by mouth at bedtime. Take with or immediately following a meal. 08/23/21   Cantwell, Celeste C, PA-C  Potassium Chloride ER 20 MEQ TBCR Take 20 mEq by mouth daily. 08/23/21   Cantwell, Celeste C, PA-C  rosuvastatin (CRESTOR) 10 MG tablet Take 1 tablet (10 mg total) by mouth daily. 08/23/21   Cantwell, Celeste C, PA-C  triamcinolone cream (KENALOG) 0.1 % Apply 1 application topically every 8 (eight) hours as needed (itching).     [provider]  valsartan (DIOVAN) 80 MG tablet Take 1 tablet by mouth daily.    [provider]      Allergies    Patient has no known allergies.    Review of Systems   Review of Systems  Skin:  Positive for rash.    Physical Exam Updated Vital Signs BP 121/60 (BP Location: Right Arm)   Pulse 70   Temp 97.9 F (36.6 C)   Resp 18   SpO2 100%  Physical Exam Vitals and nursing note reviewed.  Constitutional:      General: She is not  in acute distress.    Appearance: She is well-developed. She is not diaphoretic.  HENT:     Head: Normocephalic and atraumatic.  Eyes:     Pupils: Pupils are equal, round, and reactive to light.  Cardiovascular:     Rate and Rhythm: Normal rate and regular rhythm.     Heart sounds: No murmur heard.    No friction rub. No gallop.  Pulmonary:     Effort: Pulmonary effort is normal.     Breath sounds: No wheezing or rales.  Abdominal:     General: There is no distension.     Palpations: Abdomen is soft.     Tenderness: There is no abdominal tenderness.  Musculoskeletal:        General: No tenderness.     Cervical back: Normal range of motion and neck supple.     Comments: Hyperpigmented rash with some papules to the upper back.  Stops there.  She has some hyperpigmentation of the skin along the left elbow that is somewhat dry.  She has bilateral foot  swelling that does not go above the ankle.  Skin:    General: Skin is warm and dry.  Neurological:     Mental Status: She is alert and oriented to person, place, and time.  Psychiatric:        Behavior: Behavior normal.     ED Results / Procedures / Treatments   Labs (all labs ordered are listed, but only abnormal results are displayed) Labs Reviewed - No data to display  EKG None  Radiology No results found.  Procedures Procedures    Medications Ordered in ED Medications  predniSONE (DELTASONE) tablet 60 mg (has no administration in time range)    ED Course/ Medical Decision Making/ A&P                             Medical Decision Making Risk Prescription drug management.   82 yo F with a chief complaints of a rash and foot swelling.  She is a bit angry with me that I do not know what is going on with her because she said she was here couple weeks ago.  She tells me that 2 weeks ago that it note was going on either.  She also tells me that her doctor sent her here but after some further discussion it sounds like she had showed up at the end of the day and they did have an appointment encouraged her to come here to be evaluated.  I had a long discussion with her about how this needs to be followed up further in the primary care setting.  I reviewed her lab work from last visit, no significant electrolyte abnormality, no change to renal function.  She is having no difficulty breathing here.  No signs of overt fluid overload.  Not sure that she benefit from labs.  Will do a short course of steroids for her rash.  Encouraged her again to follow-up with her family doctor in the office.  3:45 PM:  I have discussed the diagnosis/risks/treatment options with the patient.  Evaluation and diagnostic testing in the emergency department does not suggest an emergent condition requiring admission or immediate intervention beyond what has been performed at this time.  They will follow up  with PCP. We also discussed returning to the ED immediately if new or worsening sx occur. We discussed the sx which are most concerning (e.g., sudden  worsening pain, fever, inability to tolerate by mouth) that necessitate immediate return. Medications administered to the patient during their visit and any new prescriptions provided to the patient are listed below.  Medications given during this visit Medications  predniSONE (DELTASONE) tablet 60 mg (has no administration in time range)     The patient appears reasonably screen and/or stabilized for discharge and I doubt any other medical condition or other [facility] requiring further screening, evaluation, or treatment in the ED at this time prior to discharge.          Final Clinical Impression(s) / ED Diagnoses Final diagnoses:  Rash    Rx / DC Orders ED Discharge Orders          Ordered    predniSONE (DELTASONE) 20 MG tablet        06/19/23 1540              [place], DO 06/19/23 1545

## 2023-06-19 NOTE — ED Notes (Signed)
DC papers reviewed. No questions or concerns. No signs of distress. Pt assisted to wheelchair and out to lobby. Appropriate measures for safety taken. 

## 2023-06-19 NOTE — ED Triage Notes (Signed)
Pt arrives to ED with c/o generalized rash, pruritis, and leg swelling for 2-3 weeks.

## 2023-06-28 ENCOUNTER — Inpatient Hospital Stay (HOSPITAL_COMMUNITY)
Admission: EM | Admit: 2023-06-28 | Discharge: 2023-07-07 | DRG: 308 | Disposition: A | Discharge status: Home / Self Care | Payer: 59 | Attending: Gastroenterology | Admitting: Gastroenterology

## 2023-06-28 ENCOUNTER — Other Ambulatory Visit: Payer: Self-pay

## 2023-06-28 ENCOUNTER — Emergency Department (HOSPITAL_COMMUNITY): Payer: 59

## 2023-06-28 ENCOUNTER — Encounter (HOSPITAL_COMMUNITY): Payer: Self-pay

## 2023-06-28 DIAGNOSIS — Z90710 Acquired absence of both cervix and uterus: Secondary | ICD-10-CM

## 2023-06-28 DIAGNOSIS — K31819 Angiodysplasia of stomach and duodenum without bleeding: Secondary | ICD-10-CM | POA: Diagnosis present

## 2023-06-28 DIAGNOSIS — N179 Acute kidney failure, unspecified: Secondary | ICD-10-CM | POA: Diagnosis not present

## 2023-06-28 DIAGNOSIS — I82612 Acute embolism and thrombosis of superficial veins of left upper extremity: Secondary | ICD-10-CM | POA: Diagnosis not present

## 2023-06-28 DIAGNOSIS — K2970 Gastritis, unspecified, without bleeding: Secondary | ICD-10-CM | POA: Diagnosis present

## 2023-06-28 DIAGNOSIS — Z79899 Other long term (current) drug therapy: Secondary | ICD-10-CM

## 2023-06-28 DIAGNOSIS — R319 Hematuria, unspecified: Secondary | ICD-10-CM | POA: Diagnosis not present

## 2023-06-28 DIAGNOSIS — D500 Iron deficiency anemia secondary to blood loss (chronic): Secondary | ICD-10-CM | POA: Diagnosis present

## 2023-06-28 DIAGNOSIS — Z87891 Personal history of nicotine dependence: Secondary | ICD-10-CM

## 2023-06-28 DIAGNOSIS — I493 Ventricular premature depolarization: Secondary | ICD-10-CM | POA: Diagnosis not present

## 2023-06-28 DIAGNOSIS — I252 Old myocardial infarction: Secondary | ICD-10-CM

## 2023-06-28 DIAGNOSIS — I5033 Acute on chronic diastolic (congestive) heart failure: Secondary | ICD-10-CM | POA: Diagnosis not present

## 2023-06-28 DIAGNOSIS — R7989 Other specified abnormal findings of blood chemistry: Secondary | ICD-10-CM

## 2023-06-28 DIAGNOSIS — E785 Hyperlipidemia, unspecified: Secondary | ICD-10-CM | POA: Diagnosis present

## 2023-06-28 DIAGNOSIS — D649 Anemia, unspecified: Secondary | ICD-10-CM

## 2023-06-28 DIAGNOSIS — Z91198 Patient's noncompliance with other medical treatment and regimen for other reason: Secondary | ICD-10-CM

## 2023-06-28 DIAGNOSIS — I4729 Other ventricular tachycardia: Secondary | ICD-10-CM

## 2023-06-28 DIAGNOSIS — Z7984 Long term (current) use of oral hypoglycemic drugs: Secondary | ICD-10-CM

## 2023-06-28 DIAGNOSIS — I509 Heart failure, unspecified: Secondary | ICD-10-CM | POA: Insufficient documentation

## 2023-06-28 DIAGNOSIS — I2489 Other forms of acute ischemic heart disease: Secondary | ICD-10-CM | POA: Diagnosis present

## 2023-06-28 DIAGNOSIS — I472 Ventricular tachycardia, unspecified: Secondary | ICD-10-CM | POA: Diagnosis not present

## 2023-06-28 DIAGNOSIS — K219 Gastro-esophageal reflux disease without esophagitis: Secondary | ICD-10-CM | POA: Diagnosis present

## 2023-06-28 DIAGNOSIS — L810 Postinflammatory hyperpigmentation: Secondary | ICD-10-CM | POA: Diagnosis present

## 2023-06-28 DIAGNOSIS — I110 Hypertensive heart disease with heart failure: Secondary | ICD-10-CM | POA: Diagnosis present

## 2023-06-28 DIAGNOSIS — K5909 Other constipation: Secondary | ICD-10-CM | POA: Diagnosis present

## 2023-06-28 DIAGNOSIS — Z79890 Hormone replacement therapy (postmenopausal): Secondary | ICD-10-CM

## 2023-06-28 DIAGNOSIS — I5021 Acute systolic (congestive) heart failure: Secondary | ICD-10-CM

## 2023-06-28 DIAGNOSIS — K649 Unspecified hemorrhoids: Secondary | ICD-10-CM | POA: Diagnosis present

## 2023-06-28 DIAGNOSIS — E876 Hypokalemia: Secondary | ICD-10-CM | POA: Insufficient documentation

## 2023-06-28 DIAGNOSIS — I5022 Chronic systolic (congestive) heart failure: Secondary | ICD-10-CM

## 2023-06-28 DIAGNOSIS — K5900 Constipation, unspecified: Secondary | ICD-10-CM | POA: Insufficient documentation

## 2023-06-28 DIAGNOSIS — M7989 Other specified soft tissue disorders: Secondary | ICD-10-CM | POA: Diagnosis present

## 2023-06-28 DIAGNOSIS — E119 Type 2 diabetes mellitus without complications: Secondary | ICD-10-CM | POA: Diagnosis present

## 2023-06-28 DIAGNOSIS — I4891 Unspecified atrial fibrillation: Principal | ICD-10-CM | POA: Diagnosis present

## 2023-06-28 DIAGNOSIS — E538 Deficiency of other specified B group vitamins: Secondary | ICD-10-CM | POA: Diagnosis present

## 2023-06-28 DIAGNOSIS — Z7952 Long term (current) use of systemic steroids: Secondary | ICD-10-CM

## 2023-06-28 DIAGNOSIS — I10 Essential (primary) hypertension: Secondary | ICD-10-CM

## 2023-06-28 LAB — BASIC METABOLIC PANEL
Anion gap: 18 — ABNORMAL HIGH (ref 5–15)
BUN: 18 mg/dL (ref 8–23)
CO2: 20 mmol/L — ABNORMAL LOW (ref 22–32)
Calcium: 8.6 mg/dL — ABNORMAL LOW (ref 8.9–10.3)
Chloride: 102 mmol/L (ref 98–111)
Creatinine, Ser: 1.4 mg/dL — ABNORMAL HIGH (ref 0.44–1.00)
GFR, Estimated: 38 mL/min — ABNORMAL LOW (ref >60)
Glucose, Bld: 182 mg/dL — ABNORMAL HIGH (ref 70–99)
Potassium: 3.9 mmol/L (ref 3.5–5.1)
Sodium: 140 mmol/L (ref 135–145)

## 2023-06-28 LAB — CBC WITH DIFFERENTIAL/PLATELET
Abs Immature Granulocytes: 0.02 10*3/uL (ref 0.00–0.07)
Basophils Absolute: 0 10*3/uL (ref 0.0–0.1)
Basophils Relative: 0 %
Eosinophils Absolute: 0.2 10*3/uL (ref 0.0–0.5)
Eosinophils Relative: 4 %
HCT: 17.8 % — ABNORMAL LOW (ref 36.0–46.0)
Hemoglobin: 4.8 g/dL — CL (ref 12.0–15.0)
Immature Granulocytes: 0 %
Lymphocytes Relative: 16 %
Lymphs Abs: 1 10*3/uL (ref 0.7–4.0)
MCH: 17 pg — ABNORMAL LOW (ref 26.0–34.0)
MCHC: 27 g/dL — ABNORMAL LOW (ref 30.0–36.0)
MCV: 63.1 fL — ABNORMAL LOW (ref 80.0–100.0)
Monocytes Absolute: 0.7 10*3/uL (ref 0.1–1.0)
Monocytes Relative: 12 %
Neutro Abs: 4 10*3/uL (ref 1.7–7.7)
Neutrophils Relative %: 68 %
Platelets: 244 10*3/uL (ref 150–400)
RBC: 2.82 MIL/uL — ABNORMAL LOW (ref 3.87–5.11)
RDW: 21.2 % — ABNORMAL HIGH (ref 11.5–15.5)
WBC: 5.9 10*3/uL (ref 4.0–10.5)
nRBC: 0.3 % — ABNORMAL HIGH (ref 0.0–0.2)

## 2023-06-28 LAB — FOLATE: Folate: 16.5 ng/mL (ref >5.9)

## 2023-06-28 LAB — COMPREHENSIVE METABOLIC PANEL
ALT: 15 U/L (ref 0–44)
AST: 18 U/L (ref 15–41)
Albumin: 3 g/dL — ABNORMAL LOW (ref 3.5–5.0)
Alkaline Phosphatase: 69 U/L (ref 38–126)
Anion gap: 12 (ref 5–15)
BUN: 18 mg/dL (ref 8–23)
CO2: 21 mmol/L — ABNORMAL LOW (ref 22–32)
Calcium: 8 mg/dL — ABNORMAL LOW (ref 8.9–10.3)
Chloride: 104 mmol/L (ref 98–111)
Creatinine, Ser: 1.25 mg/dL — ABNORMAL HIGH (ref 0.44–1.00)
GFR, Estimated: 43 mL/min — ABNORMAL LOW (ref >60)
Glucose, Bld: 135 mg/dL — ABNORMAL HIGH (ref 70–99)
Potassium: 2.9 mmol/L — ABNORMAL LOW (ref 3.5–5.1)
Sodium: 137 mmol/L (ref 135–145)
Total Bilirubin: 0.4 mg/dL (ref 0.3–1.2)
Total Protein: 5.7 g/dL — ABNORMAL LOW (ref 6.5–8.1)

## 2023-06-28 LAB — IRON AND TIBC
Iron: 26 ug/dL — ABNORMAL LOW (ref 28–170)
Saturation Ratios: 5 % — ABNORMAL LOW (ref 10.4–31.8)
TIBC: 533 ug/dL — ABNORMAL HIGH (ref 250–450)
UIBC: 507 ug/dL

## 2023-06-28 LAB — PREPARE RBC (CROSSMATCH)

## 2023-06-28 LAB — HEMOGLOBIN AND HEMATOCRIT, BLOOD
HCT: 22.5 % — ABNORMAL LOW (ref 36.0–46.0)
Hemoglobin: 6.4 g/dL — CL (ref 12.0–15.0)

## 2023-06-28 LAB — POC OCCULT BLOOD, ED: Fecal Occult Bld: NEGATIVE

## 2023-06-28 LAB — HEMOGLOBIN A1C
Hgb A1c MFr Bld: 6.5 % — ABNORMAL HIGH (ref 4.8–5.6)
Mean Plasma Glucose: 139.85 mg/dL

## 2023-06-28 LAB — FERRITIN: Ferritin: 3 ng/mL — ABNORMAL LOW (ref 11–307)

## 2023-06-28 LAB — BRAIN NATRIURETIC PEPTIDE: B Natriuretic Peptide: 731.2 pg/mL — ABNORMAL HIGH (ref 0.0–100.0)

## 2023-06-28 LAB — TROPONIN I (HIGH SENSITIVITY)
Troponin I (High Sensitivity): 198 ng/L (ref <18)
Troponin I (High Sensitivity): 212 ng/L (ref <18)
Troponin I (High Sensitivity): 224 ng/L (ref <18)
Troponin I (High Sensitivity): 235 ng/L (ref <18)

## 2023-06-28 LAB — VITAMIN B12: Vitamin B-12: 121 pg/mL — ABNORMAL LOW (ref 180–914)

## 2023-06-28 LAB — TSH: TSH: 3.235 u[IU]/mL (ref 0.350–4.500)

## 2023-06-28 LAB — GLUCOSE, CAPILLARY: Glucose-Capillary: 142 mg/dL — ABNORMAL HIGH (ref 70–99)

## 2023-06-28 LAB — MAGNESIUM: Magnesium: 1.7 mg/dL (ref 1.7–2.4)

## 2023-06-28 LAB — CBG MONITORING, ED: Glucose-Capillary: 162 mg/dL — ABNORMAL HIGH (ref 70–99)

## 2023-06-28 MED ORDER — SODIUM CHLORIDE 0.9% IV SOLUTION: Freq: Once | INTRAVENOUS | Status: DC

## 2023-06-28 MED ORDER — METOPROLOL SUCCINATE ER 100 MG PO TB24
100.0000 mg | ORAL_TABLET | Freq: Every day | ORAL | Status: DC
  Administered 2023-06-28 – 2023-06-29 (×2): 100 mg via ORAL
  Filled 2023-06-28 (×2): qty 1

## 2023-06-28 MED ORDER — MAGNESIUM SULFATE 2 GM/50ML IV SOLN
2.0000 g | Freq: Once | INTRAVENOUS | Status: AC
  Administered 2023-06-28: 2 g via INTRAVENOUS
  Filled 2023-06-28: qty 50

## 2023-06-28 MED ORDER — TRIAMCINOLONE ACETONIDE 0.1 % EX CREA
1.0000 | TOPICAL_CREAM | Freq: Three times a day (TID) | CUTANEOUS | Status: DC | PRN
  Administered 2023-06-28 – 2023-07-04 (×4): 1 via TOPICAL
  Filled 2023-06-28 (×2): qty 15

## 2023-06-28 MED ORDER — DILTIAZEM HCL-DEXTROSE 125-5 MG/125ML-% IV SOLN (PREMIX)
INTRAVENOUS | Status: AC
  Administered 2023-06-28: 5 mg/h via INTRAVENOUS
  Filled 2023-06-28: qty 125

## 2023-06-28 MED ORDER — ROSUVASTATIN CALCIUM 5 MG PO TABS
10.0000 mg | ORAL_TABLET | Freq: Every day | ORAL | Status: DC
  Administered 2023-06-29 – 2023-07-07 (×9): 10 mg via ORAL
  Filled 2023-06-28 (×9): qty 2

## 2023-06-28 MED ORDER — POTASSIUM CHLORIDE CRYS ER 20 MEQ PO TBCR
40.0000 meq | EXTENDED_RELEASE_TABLET | Freq: Once | ORAL | Status: AC
  Administered 2023-06-28: 40 meq via ORAL
  Filled 2023-06-28: qty 2

## 2023-06-28 MED ORDER — PANTOPRAZOLE SODIUM 40 MG PO TBEC
40.0000 mg | DELAYED_RELEASE_TABLET | Freq: Every day | ORAL | Status: DC
  Administered 2023-06-29 – 2023-07-07 (×9): 40 mg via ORAL
  Filled 2023-06-28 (×10): qty 1

## 2023-06-28 MED ORDER — DILTIAZEM LOAD VIA INFUSION
10.0000 mg | Freq: Once | INTRAVENOUS | Status: DC
  Filled 2023-06-28: qty 10

## 2023-06-28 MED ORDER — ORAL CARE MOUTH RINSE: 15.0000 mL | OROMUCOSAL | Status: DC | PRN

## 2023-06-28 MED ORDER — POTASSIUM CHLORIDE 20 MEQ PO PACK: 40.0000 meq | PACK | Freq: Once | ORAL | Status: DC

## 2023-06-28 MED ORDER — FUROSEMIDE 10 MG/ML IJ SOLN
20.0000 mg | Freq: Once | INTRAMUSCULAR | Status: AC
  Administered 2023-06-28: 20 mg via INTRAVENOUS
  Filled 2023-06-28: qty 2

## 2023-06-28 MED ORDER — DILTIAZEM HCL-DEXTROSE 125-5 MG/125ML-% IV SOLN (PREMIX)
5.0000 mg/h | INTRAVENOUS | Status: DC
  Administered 2023-06-28: 10 mg/h via INTRAVENOUS
  Administered 2023-06-29: 5 mg/h via INTRAVENOUS
  Filled 2023-06-28 (×2): qty 125

## 2023-06-28 MED ORDER — INSULIN ASPART 100 UNIT/ML IJ SOLN
0.0000 [IU] | Freq: Three times a day (TID) | INTRAMUSCULAR | Status: DC
  Administered 2023-06-28: 2 [IU] via SUBCUTANEOUS
  Administered 2023-06-29 (×2): 1 [IU] via SUBCUTANEOUS

## 2023-06-28 MED ORDER — POLYETHYLENE GLYCOL 3350 17 G PO PACK: 17.0000 g | PACK | Freq: Every day | ORAL | Status: DC | PRN

## 2023-06-28 NOTE — Assessment & Plan Note (Signed)
Patient on metformin at home.  Last A1c in 2022 was 6.3. -Hold metformin during hospitalization -Repeat A1c -SSI ordered - CBGs AC, HS

## 2023-06-28 NOTE — ED Provider Notes (Addendum)
[place] EMERGENCY DEPARTMENT AT [facility] Provider Note   CSN: [id number] Arrival date & time: 06/28/23  0254     History  Chief Complaint  Patient presents with   Tachycardia    Jasmin Miller is a 82 y.o. female.  Pt is an 82 y.o. 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  Unclear if this is a prior medical problem for the patient she denies any blood thinners.  Patient does report that she is compliant with her medication.  Patient upon arrival here is guarded in giving me information and reports there is just too many questions.  She states I can read her medical records to see what is going on.  Patient does report she has a rash on her back her arms and her legs that itches and burns.  She was given prednisone several weeks ago and took 4 days which she does think helped a little bit but the medication ran out.  She has also noticed swelling in her legs but does not want to talk about shortness of breath or chest pain.  She initially denies but was seen within the last month complaining of dyspnea on exertion.  There was no report of cough, emesis, diarrhea or fever.  Patient does report being compliant with her medications.  The history is provided by the patient, medical records and the EMS personnel.       Home Medications Prior to Admission medications   Medication Sig Start Date End Date Taking? Authorizing Provider  amLODipine (NORVASC) 5 MG tablet TAKE 1 TABLET(5 MG) BY MOUTH DAILY 12/23/21   Cantwell, Celeste C, PA-C  Ferrous Sulfate Dried (HIGH POTENCY IRON) 65 MG TABS Fusion Plus 130 mg iron-1,250 mcg capsule   Take 1 capsule every day by oral route.    [provider]  furosemide (LASIX) 40 MG tablet Take 1 tablet (40 mg total) by mouth daily. 09/11/21   Cantwell, Celeste C, PA-C  linaclotide (LINZESS) 145 MCG CAPS capsule Linzess 145 mcg capsule  Take 1 capsule every day by oral route.    [provider]  metFORMIN (GLUCOPHAGE-XR) 500 MG 24 hr tablet TAKE 1 TABLET(500 MG) BY MOUTH DAILY WITH BREAKFAST 09/11/21   Cantwell, Celeste C, PA-C  metoprolol succinate (TOPROL-XL) 100 MG 24 hr tablet Take 1 tablet (100 mg total) by mouth at bedtime. Take with or immediately following a meal. 08/23/21   Cantwell, Celeste C, PA-C  Potassium Chloride ER 20 MEQ TBCR Take 20 mEq by mouth daily. 08/23/21   Cantwell, Celeste C, PA-C  predniSONE (DELTASONE) 20 MG tablet 2 tabs po daily x 4 days 06/19/23   Melene Plan, DO  rosuvastatin (CRESTOR) 10 MG tablet Take 1 tablet (10 mg total) by mouth daily. 08/23/21   Cantwell, Celeste C, PA-C  triamcinolone cream (KENALOG) 0.1 % Apply 1 application topically every 8 (eight) hours as needed (itching).     [provider]  valsartan (DIOVAN) 80 MG tablet Take 1 tablet by mouth daily.    [provider]      Allergies    Patient has no known allergies.    Review of  Systems   Review of Systems  Physical Exam Updated Vital Signs BP 102/66   Pulse (!) 121   Temp 98.5 F (36.9 C) (Oral)   Resp 19   Ht 5\' 1"  (1.549 m)   Wt 65.8 kg   SpO2 100%   BMI 27.40 kg/m  Physical Exam Vitals and nursing note reviewed.  Constitutional:      General: She is not in acute distress.    Appearance: She is well-developed.  HENT:     Head: Normocephalic and atraumatic.     Mouth/Throat:     Mouth: Mucous membranes are moist.  Eyes:     Pupils: Pupils are equal, round, and reactive to light.  Cardiovascular:     Rate and Rhythm: Tachycardia present. Rhythm irregularly irregular.     Pulses: Normal pulses.     Heart sounds: Normal heart sounds. No  murmur heard.    No friction rub.  Pulmonary:     Effort: Pulmonary effort is normal.     Breath sounds: Normal breath sounds. No wheezing or rales.  Abdominal:     General: Bowel sounds are normal. There is no distension.     Palpations: Abdomen is soft.     Tenderness: There is no abdominal tenderness. There is no guarding or rebound.  Genitourinary:    Rectum: Guaiac result negative.  Musculoskeletal:        General: No tenderness. Normal range of motion.     Right lower leg: Edema present.     Left lower leg: Edema present.     Comments: No edema  Skin:    General: Skin is warm and dry.     Coloration: Skin is pale.     Findings: Rash present.     Comments: Excoriation present over the upper and lower back.  Minimal sparse papules noted.  No vesicles, erythema or raised plaques at this time  Neurological:     Mental Status: She is alert and oriented to person, place, and time. Mental status is at baseline.     Cranial Nerves: No cranial nerve deficit.  Psychiatric:        Behavior: Behavior normal.     ED Results / Procedures / Treatments   Labs (all labs ordered are listed, but only abnormal results are displayed) Labs Reviewed  CBC WITH DIFFERENTIAL/PLATELET - Abnormal; Notable for the following components:      Result Value   RBC 2.82 (*)    Hemoglobin 4.8 (*)    HCT 17.8 (*)    MCV 63.1 (*)    MCH 17.0 (*)    MCHC 27.0 (*)    RDW 21.2 (*)    nRBC 0.3 (*)    All other components within normal limits  BRAIN NATRIURETIC PEPTIDE - Abnormal; Notable for the following components:   B Natriuretic Peptide 731.2 (*)    All other components within normal limits  COMPREHENSIVE METABOLIC PANEL - Abnormal; Notable for the following components:   Potassium 2.9 (*)    CO2 21 (*)    Glucose, Bld 135 (*)    Creatinine, Ser 1.25 (*)    Calcium 8.0 (*)    Total Protein 5.7 (*)    Albumin 3.0 (*)    GFR, Estimated 43 (*)    All other components within normal limits   TROPONIN I (HIGH SENSITIVITY) - Abnormal; Notable for the following components:   Troponin I (High Sensitivity) 198 (*)    All other components within normal limits  TROPONIN I (HIGH SENSITIVITY) - Abnormal; Notable for the following components:   Troponin I (High Sensitivity) 212 (*)    All other components within normal limits  POC OCCULT BLOOD, ED  TYPE AND SCREEN  PREPARE RBC (CROSSMATCH)    EKG EKG Interpretation Date/Time:  Sunday June 28 2023 07:23:05 EDT Ventricular Rate:  127 PR Interval:    QRS Duration:  82 QT Interval:  335 QTC Calculation: 487 R Axis:   26  Text Interpretation: new Atrial fibrillation Paired ventricular premature complexes Abnormal T, consider ischemia, lateral leads Confirmed by Gwyneth Sprout ([id number]) on 06/28/2023 7:27:40 AM  Radiology DG Chest Port 1 View  Result Date: 06/28/2023 CLINICAL DATA:  doe, tachycardia EXAM: PORTABLE CHEST - 1 VIEW COMPARISON:  05/22/2023 FINDINGS: Lungs are clear. Heart size upper limits normal. Aortic Atherosclerosis (ICD10-170.0). No effusion. Bilateral shoulder DJD. IMPRESSION: No acute findings. Electronically Signed   By: Corlis Leak M.D.   On: 06/28/2023 09:06    Procedures Procedures    Medications Ordered in ED Medications  diltiazem (CARDIZEM) 1 mg/mL load via infusion 10 mg (10 mg Intravenous Not Given 06/28/23 0746)    And  diltiazem (CARDIZEM) 125 mg in dextrose 5% 125 mL (1 mg/mL) infusion (10 mg/hr Intravenous Rate/Dose Change 06/28/23 0820)  0.9 %  sodium chloride infusion (Manually program via Guardrails IV Fluids) (has no administration in time range)  furosemide (LASIX) injection 20 mg (has no administration in time range)  potassium chloride SA (KLOR-CON M) CR tablet 40 mEq (40 mEq Oral Given 06/28/23 1022)    ED Course/ Medical Decision Making/ A&P                             Medical Decision Making Amount and/or Complexity of Data Reviewed Independent Historian: EMS External Data  Reviewed: notes. Labs: ordered. Decision-making details documented in ED Course. Radiology: ordered and independent interpretation performed. Decision-making details documented in ED Course. ECG/medicine tests: ordered and independent interpretation performed. Decision-making details documented in ED Course.  Risk Prescription drug management. Drug therapy requiring intensive monitoring for toxicity. Decision regarding hospitalization.   Pt with multiple medical problems and comorbidities and presenting today with a complaint that caries a high risk for morbidity and mortality.  Here today initially complaining of a rash but based on patient being on continuous cardiac monitoring in the room she appears to be in atrial fibrillation with RVR.  Unclear when this rhythm started as she was seen in the emergency room a few weeks ago and at that time her heart rate was 70.  She was seen at the end of June and had an EKG at that time that showed a sinus rhythm with PVCs.  Reviewing patient's prior cardiology notes from a few years ago and recent doctor's notes do not mention atrial fibrillation as prior medical issue.  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  Concern for fluid overload from atrial fibrillation and worsening CHF which she does have a history of versus worsening anemia versus AKI.  Patient is complaining of a rash and itching and had been on prednisone which this could possibly be a side effect of the prednisone.  She is not currently on it now.  Low suspicion for infectious etiology at this time.  Patient is hemodynamically stable.  She is not a candidate for cardioversion as she is not anticoagulated  and this may be difficult due to her past medical history of anemia.  I independently interpreted patient's EKG which shows atrial fibrillation with RVR today.  Will start on Cardizem for rate control.  10:42 AM I  independently interpreted patient's labs today and patient CBC has a new hemoglobin of 4.8 from her baseline of 9.  Feel like this is most likely adding in to her symptoms of dyspnea on exertion.  Discussed this with the patient and she gives consent to receive blood transfusion she was ordered 2 units but she will require furosemide as she has a prior history of CHF and has findings of fluid overload today.  CMP with hypokalemia of 2.9, creatinine 1.25 and normal LFTs, BNP is 731 which is improved from prior tests which were in the thousands, initial troponin was 198 and repeat is 212 most likely secondary to patient's atrial fibrillation and anemia.  I have independently visualized and interpreted pt's images today. CXR wnl.  Patient's Hemoccult today is negative and unclear what is causing patient's anemia.  Although still may be related to GI based on her past.  Feel that patient requires admission today.  Will consult unassigned medicine for admission.  Patient knows the plan and she is comfortable with this plan. Dr. Odis Hollingshead will consult on the pt.  Hold anticoagulants at this time. CRITICAL CARE Performed by: Yaretsi Humphres Total critical care time: 40 minutes Critical care time was exclusive of separately billable procedures and treating other patients. Critical care was necessary to treat or prevent imminent or life-threatening deterioration. Critical care was time spent personally by me on the following activities: development of treatment plan with patient and/or surrogate as well as nursing, discussions with consultants, evaluation of patient's response to treatment, examination of patient, obtaining history from patient or surrogate, ordering and performing treatments and interventions, ordering and review of laboratory studies, ordering and review of radiographic studies, pulse oximetry and re-evaluation of patient's condition.         Final Clinical Impression(s) / ED Diagnoses Final  diagnoses:  None    Rx / DC Orders ED Discharge Orders     None         Gwyneth Sprout, MD 06/28/23 1044    Gwyneth Sprout, MD 06/28/23 1156

## 2023-06-28 NOTE — Assessment & Plan Note (Addendum)
New onset. - Admit to [facility], attending Dr. Miquel Dunn - Progressive, Vital signs per floor - Regular diet  - PT/OT to treat - VTE prophylaxis: SCDs - AM CBC/BMP  - started on cardizem for rate control  - Cardiology consulted, appreciate recs

## 2023-06-28 NOTE — Consult Note (Signed)
CARDIOLOGY CONSULT NOTE  Patient ID: Jasmin Miller MRN: [id number] DOB/AGE: [birth year] 82 y.o.  Admit date: 06/28/2023 Attending physician: Billey Co, MD Primary Physician:  Lindaann Pascal, PA-C Outpatient Cardiology Provider: Dr. Truett Mainland, [place], [place] Inpatient Cardiologist: Tessa Lerner, DO, [facility]  Reason of consultation: Atrial fibrillation with rapid ventricular rate Referring physician: Billey Co, MD  Chief complaint: Rash and fast heart rate  HPI:  Jasmin Miller is a 82 y.o. 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  Patient states that she was having a rash that was not tolerable and therefore called EMS.  Patient was noted to be tachycardic/fast heart rate rhythm strip illustrated A-fib with RVR and patient was brought to the ED for further evaluation and management.  Upon further evaluation she was noted to be anemic with a hemoglobin of 4.8 g/dL, tachycardic with A-fib with RVR, high sensitive troponins initially at 198, elevated BNP.  Cardiology was asked to see the patient for management of A-fib with RVR.  At the time of evaluation patient denies anginal chest pain or heart failure symptoms.  She feels cold.  Denies near-syncope or syncopal events.  Ankle swelling bilaterally without orthopnea, PND.  Patient states that she had a myocardial infarction while she was in [place] back in 1994.  To the best of her knowledge no coronary interventions were performed.  Review of systems are also positive for shortness of breath.  She denies any overt signs of bleeding but does have history of AVMs.  ALLERGIES: No Known Allergies  PAST MEDICAL HISTORY: Past Medical History:  Diagnosis Date   DM2  (diabetes mellitus, type 2) (HCC)    Hypertension    MI (myocardial infarction) (HCC)    Symptomatic anemia    11/2018    PAST SURGICAL HISTORY: Past Surgical History:  Procedure Laterality Date   ABDOMINAL HYSTERECTOMY     BIOPSY  07/12/2020   Procedure: BIOPSY;  Surgeon: Charlott Rakes, MD;  Location: [facility] ENDOSCOPY;  Service: Endoscopy;;   COLONOSCOPY  07/12/2020   COLONOSCOPY WITH PROPOFOL N/A 07/12/2020   Procedure: COLONOSCOPY WITH PROPOFOL;  Surgeon: Charlott Rakes, MD;  Location: [facility] ENDOSCOPY;  Service: Endoscopy;  Laterality: N/A;   ESOPHAGOGASTRODUODENOSCOPY  07/12/2020   ESOPHAGOGASTRODUODENOSCOPY N/A 07/12/2020   Procedure: ESOPHAGOGASTRODUODENOSCOPY (EGD);  Surgeon: Charlott Rakes, MD;  Location: [facility] ENDOSCOPY;  Service: Endoscopy;  Laterality: N/A;   ESOPHAGOGASTRODUODENOSCOPY N/A 04/19/2021   Procedure: ESOPHAGOGASTRODUODENOSCOPY (EGD);  Surgeon: Charlott Rakes, MD;  Location: [facility] ENDOSCOPY;  Service: Endoscopy;  Laterality: N/A;   HOT HEMOSTASIS N/A 07/12/2020   Procedure: HOT HEMOSTASIS (ARGON PLASMA COAGULATION/BICAP);  Surgeon: Charlott Rakes, MD;  Location: [facility] ENDOSCOPY;  Service: Endoscopy;  Laterality: N/A;  EGD and COLON    FAMILY HISTORY: The patient's family history includes Anemia in her daughter and mother; Stroke in her mother.   SOCIAL HISTORY:  The patient  reports that she quit smoking about 31 years ago. Her smoking use included cigarettes. She started smoking about 60 years ago. She has a 29 pack-year smoking history. She has never used smokeless tobacco. She reports that she does not drink alcohol and does not use drugs.  MEDICATIONS: Current Outpatient Medications  Medication Instructions   amLODipine (NORVASC) 5 MG tablet TAKE 1 TABLET(5 MG) BY MOUTH DAILY   Ferrous Sulfate Dried (  HIGH POTENCY IRON) 65 MG TABS 1 capsule, Oral, Daily   furosemide (LASIX) 40 mg, Oral, Daily   metFORMIN (GLUCOPHAGE-XR) 500 mg, Oral, Every evening   metoprolol  succinate (TOPROL-XL) 100 mg, Oral, Daily at bedtime, Take with or immediately following a meal.   pantoprazole (PROTONIX) 40 mg, Oral, Daily   Potassium Chloride ER 20 MEQ TBCR 20 mEq, Oral, Daily   rosuvastatin (CRESTOR) 10 mg, Oral, Daily   triamcinolone cream (KENALOG) 0.1 % 1 application , Topical, Every 8 hours PRN   valsartan (DIOVAN) 80 mg, Oral, Daily    REVIEW OF SYSTEMS: Review of Systems  Constitutional: Positive for malaise/fatigue and weight loss.       Feels cool   Cardiovascular:  Positive for dyspnea on exertion. Negative for chest pain, claudication, irregular heartbeat, leg swelling, near-syncope, orthopnea, palpitations, paroxysmal nocturnal dyspnea and syncope.  Respiratory:  Positive for shortness of breath.   Hematologic/Lymphatic: Negative for bleeding problem.  Musculoskeletal:  Negative for muscle cramps and myalgias.  Gastrointestinal:  Negative for hematemesis, hematochezia and melena.  Neurological:  Negative for dizziness and light-headedness.    PHYSICAL EXAMINATION: PHYSICAL EXAM: Temp:  [98.1 F (36.7 C)-98.5 F (36.9 C)] 98.5 F (36.9 C) (07/21 1439) Pulse Rate:  [46-158] 103 (07/21 1439) Resp:  [16-31] 16 (07/21 1439) BP: (95-112)/(57-84) 112/66 (07/21 1439) SpO2:  [96 %-100 %] 98 % (07/21 1439) Weight:  [65.8 kg] 65.8 kg (07/21 0727)  Intake/Output:  Intake/Output Summary (Last 24 hours) at 06/28/2023 1744 Last data filed at 06/28/2023 1439 Gross per 24 hour  Intake 315 ml  Output --  Net 315 ml     Net IO Since Admission: 315 mL [06/28/23 1744]  Weights:     06/28/2023    7:27 AM 10/17/2021   12:59 PM 09/11/2021   12:59 PM  Last 3 Weights  Weight (lbs) 145 lb 138 lb 6.4 oz 142 lb  Weight (kg) 65.772 kg 62.778 kg 64.411 kg     Physical Exam  Constitutional:  Chronically ill-appearing, hemodynamically stable, endorses feeling cold  Neck: No JVD present.  Cardiovascular: S1 normal, S2 normal, intact distal pulses and normal  pulses. An irregularly irregular rhythm present. Tachycardia present.  No murmurs rubs or gallops appreciated secondary to tachycardia  Pulmonary/Chest: Effort normal and breath sounds normal. No stridor. She has no wheezes. She has no rales.  Abdominal: Soft. Bowel sounds are normal. She exhibits no distension. There is no abdominal tenderness.  Musculoskeletal:        General: Edema (Bilateral ankle swelling.) present.     Cervical back: Neck supple.  Neurological: She is alert and oriented to person, place, and time. She has intact cranial nerves (2-12).  Skin: Skin is warm and moist. Rash noted.    LAB RESULTS: Chemistry Recent Labs  Lab 06/28/23 0737  NA 137  K 2.9*  CL 104  CO2 21*  GLUCOSE 135*  BUN 18  CREATININE 1.25*  CALCIUM 8.0*  PROT 5.7*  ALBUMIN 3.0*  AST 18  ALT 15  ALKPHOS 69  BILITOT 0.4  GFRNONAA 43*  ANIONGAP 12    Hematology Recent Labs  Lab 06/28/23 0737  WBC 5.9  RBC 2.82*  HGB 4.8*  HCT 17.8*  MCV 63.1*  MCH 17.0*  MCHC 27.0*  RDW 21.2*  PLT 244   High Sensitivity Troponin:   Recent Labs  Lab 06/28/23 0737 06/28/23 0929  TROPONINIHS 198* 212*     Cardiac EnzymesNo results for input(s): "TROPONINI" in the last  168 hours. No results for input(s): "TROPIPOC" in the last 168 hours.  BNP Recent Labs  Lab 06/28/23 0737  BNP 731.2*    DDimer No results for input(s): "DDIMER" in the last 168 hours.  Hemoglobin A1c:  Lab Results  Component Value Date   HGBA1C 6.3 (H) 04/18/2021   MPG 134 04/18/2021   TSH No results for input(s): "TSH" in the last 8760 hours. Lipid Panel No results found for: "CHOL", "HDL", "LDLCALC", "LDLDIRECT", "TRIG", "CHOLHDL" Drugs of Abuse     Component Value Date/Time   LABOPIA NONE DETECTED 10/03/2015 1313   COCAINSCRNUR NONE DETECTED 10/03/2015 1313   LABBENZ NONE DETECTED 10/03/2015 1313   AMPHETMU NONE DETECTED 10/03/2015 1313   THCU NONE DETECTED 10/03/2015 1313   LABBARB NONE DETECTED 10/03/2015  1313      CARDIAC DATABASE: EKG: June 28, 2023: Atrial fibrillation, 127 bpm, poor R wave progression, without underlying injury pattern, frequent PVCs.  Echocardiogram:  04/19/2021:   1. Left ventricular ejection fraction, by estimation, is 50%. The left ventricle has mildly decreased function. The left ventricle demonstrates regional wall motion abnormalities with severe hypokinesis of the basal inferior and basal inferolateral  walls, hypokinesis of the basal anterolateral wall. Left ventricular diastolic parameters are consistent with Grade II diastolic dysfunction (pseudonormalization).  2. Right ventricular systolic function is mildly reduced. The right ventricular size is normal. There is normal pulmonary artery systolic pressure. The estimated right ventricular systolic pressure is 29.3 mmHg.  3. Left atrial size was severely dilated.  4. Right atrial size was mildly dilated.  5. The mitral valve is abnormal. At least moderate to possibly severe mitral valve regurgitation. This may not be fully visualized. Possible infarct-related mitral regurgitation given regional wall motion abnormalities. No evidence of mitral stenosis.  6. The aortic valve is tricuspid. Aortic valve regurgitation is not visualized. Mild aortic valve sclerosis is present, with no evidence of aortic valve stenosis.  7. The inferior vena cava is dilated in size with <50% respiratory variability, suggesting right atrial pressure of 15 mmHg.   Scheduled Meds:  sodium chloride   Intravenous Once   diltiazem  10 mg Intravenous Once   insulin aspart  0-9 Units Subcutaneous TID WC   metoprolol succinate  100 mg Oral Daily   pantoprazole  40 mg Oral Daily   potassium chloride  40 mEq Oral Once   [START ON 06/29/2023] rosuvastatin  10 mg Oral Daily    Continuous Infusions:  diltiazem (CARDIZEM) infusion 10 mg/hr (06/28/23 0820)    PRN Meds: polyethylene glycol, triamcinolone cream  IMPRESSION &  RECOMMENDATIONS: Jasmin Miller is a 82 y.o. African-American female whose past medical history and cardiovascular risk factors include: HTN, HLD, anemia, myocardial infarction (1994), history of GI bleed secondary to AVM, postmenopausal female, advanced age.  Impression:  Atrial fibrillation with rapid ventricular rate. Symptomatic anemia. Hypokalemia. Premature ventricular contractions  Benign essential hypertension Elevated high sensitive troponins Elevated BNP Non-insulin-dependent diabetes mellitus type 2 Former smoker   Plan:  Atrial fibrillation with rapid ventricular rate: Likely brought on by symptomatic anemia. Rate control: Resume home dose metoprolol succinate (hx of low normal LVEF so would like to minimize use of IV Diltiazem if possible) Rhythm control: N/A Thromboembolic prophylaxis: None -in the setting of severe symptomatic anemia with a hemoglobin of 4.8 g/dL on arrival.  Patient needs to be evaluated by either GI or hematology prior to initiating anticoagulation. Focus on rate control strategy. Currently placed on Cardizem drip-wean as tolerated Click Here to  Calculate/Change CHADS2VASc Score The patient's CHADS2-VASc score is 5, indicating a 7.2% annual risk of stroke.  CHF History: No HTN History: Yes Diabetes History: Yes Stroke History: No Vascular Disease History: No Check TSH Correct electrolytes  Symptomatic anemia: Has known history of AVMs. Hemoglobin on arrival 4.8 g/dL. Status post 1 unit of PRBCs. Currently being managed by primary team.  Hypokalemia: Recommend aggressive replacement of electrolytes.  Will check magnesium levels.  Premature ventricular contractions: Treat the underlying anemia and electrolyte abnormalities. Continue to monitor.  Benign essential hypertension: Management per primary team.  High sensitive troponins and elevated BNP:  Likely secondary to A-fib with RVR and symptomatic anemia.   Not in overt heart failure  based on physical examination. Echocardiogram pending further recommendations to follow. Despite hemoglobin of 4.8 g/dL on arrival she does not complain of anginal chest pain or heart failure symptoms. Her lower extremity swelling is isolated to bilateral ankles-recommend holding amlodipine.  Non-insulin-dependent diabetes mellitus type 2: Management per primary.  Hyperlipidemia: Continue home dose statin therapy.  Check fasting lipids.  Patient's questions and concerns were addressed to her satisfaction. She voices understanding of the instructions provided during this encounter.   This note was created using a voice recognition software as a result there may be grammatical errors inadvertently enclosed that do not reflect the nature of this encounter. Every attempt is made to correct such errors.  Delilah Shan [facility]  Pager:  [phone] Office: [phone] 06/28/2023, 5:44 PM

## 2023-06-28 NOTE — ED Notes (Signed)
Amditting doctor a  the bedside

## 2023-06-28 NOTE — Assessment & Plan Note (Signed)
Rash with itching to the patient's upper back/bilateral shoulders. rash has been present for the patient has had several healthcare encounters guarding this issue.  She most recently was given a short course of prednisone which did seem to help some. - kenalog cream ordered PRN

## 2023-06-28 NOTE — H&P (Addendum)
Hospital Admission History and Physical Service Pager: [phone]  Patient name: Jasmin Miller Medical record number: [id number] Date of Birth: 07/10/1941 Age: 82 y.o. Gender: female  Primary Care Provider: Lindaann Pascal, PA-C Consultants: Cardiology Code Status: Full which was confirmed with patient Preferred Emergency Contact:  Contact Information     Name Relation Home Work Cut Off Daughter [phone]     [place] [phone]        Other Contacts   None on File      Chief Complaint: Initial complaint of rash, then found with tachycardia  Assessment and Plan: Jasmin Miller is a 82 y.o. female presenting with complaint of a rash, then found to be tachycardic and with new onset Afib with RVR . Differential for presentation of this includes worsening CHF, chronic anemia, PE, medication side effects/misadventure, electrolyte derangement, infectious etiology. Anemia may be a culprit in this patient's presentation. She is chronically anemic. Mentzer Index suggestive of iron deficiency anemia rather than Thalassemia.  Electrolyte derangement is present (K+ 2.9), likely contributing to A-fib with RVR. There is some question of worsening CHF. Patient takes Lasix at home but at this time is not sure how much. This is the only medication she does not have with her in her purse on arrival to the hospital. She does have some findings of fluid overload including SOB, bilateral LE edema. In conversation with patient, she is very knowledgeable about her medications other than her Lasix, as above. Low suspicion for medication misadventure in this setting. The patient was recently on a course of Prednisone for her rash, but unlikely that such a short course would be to blame for her current presentation. Patient does not have a history of PE/DVT, and denies chest pain. Wells Criteria is 1.5/Low Risk, so low suspicion for PE at this time. No fevers or other  evidence of systemic infection.  [facility]     * (Principal) Atrial fibrillation with RVR (HCC)     New onset, stable, appears to be responding to diltiazem gtt. Will  attempt rate control however defer to cardiology recommendations.  Unfortunately, anemia complicates picture and is not appropriate for  anticoagulation at this time.  Unsure if ACS clouds picture with elevated  trops. - Admit to FMTS, progressive unit, attending Dr. Miquel Dunn - Continuous cardiac monitoring - Cardiology (Dr. Odis Hollingshead) consulted, appreciate recommendations - Cardizem for rate control - PT/OT to treat - AM CBC/BMP  - TSH - Continue to trend tropes - started on cardizem for rate control  - Echocardiogram - Electrolyte goals of K> 4, Mg> 2; replete as necessary        DM2 (diabetes mellitus, type 2) (HCC)     Patient on metformin 500 mg daily at home.  Last A1c in 2022 was 6.3. -Hold metformin during hospitalization -Repeat A1c -Sensitive SSI ordered - CBGs 4 times daily before meals and at bedtime        AKI (acute kidney injury) (HCC)     Creatinine 1.25 on arrival.  Baseline somewhat unclear, appears to be  somewhere around 1.  Complicated in the setting of anemia and A-fib,  receiving IV fluids with other therapies at this time. - Repeat BMP in a.m. - Avoid nephrotoxic drugs        Anemia     This is a chronic problem, ongoing for at least the last 5 years. Hgb  4.8, microcytic with hematocrit 63.1. Patient had endoscopy 2-3  years ago  which revealed bleeding AVMs, thought at the time to be the source of her  anemia.  She has received several IV iron infusions in the past via  oncology clinic, most recently in late 2022.  No obvious active bleeding  on presentation today.  Patient denies hematemesis, blood in stool.   States she is iron deficient and has not been taking iron supplementation.   FOBT negative.  It is likely that extended time of going without IV iron  infusion has allowed  her to slowly drop.  Receiving 1 unit PRBC.  Suspect  will need more. - Posttransfusion H/H - Iron, TIBC, ferritin, B12, folate labs - Repeat CBC in a.m., consider additional transfusion as needed -Consult to GI to evaluate GI bleeding as culprit -Consider IV iron infusion and potential hematology consult        Post-inflammatory hyperpigmentation     Rash with itching to the patient's upper back/bilateral shoulders. Rash  has been present for the patient has had several healthcare encounters  guarding this issue.  She most recently was given a short course of  prednisone which did seem to help some. - kenalog cream ordered PRN        CHF (congestive heart failure) (HCC)     Patient takes Lasix at home, though not immediately sure of the dose,  chart review reflects 20 mg daily. -Lasix 20 mg IV given -Continue to monitor for signs of fluid overload     Chronic, stable conditions: Hyperlipidemia: Continue home rosuvastatin 10 mg daily Chronic constipation: MiraLAX daily as needed GERD: Protonix 40 mg daily  FEN/GI: Regular diet VTE Prophylaxis: SCDs  Disposition: Progressive  History of Present Illness:  Jasmin Miller is a 82 y.o. female presenting via ambulance from home with complaint of a burning/itching sensation on her back with a rash that has been present for several weeks.  She states this is also present on her left elbow and her legs.   Upon arrival to the ED patient was found to be tachycardic and to be in apparent new onset atrial fibrillation with RVR.  She denies chest pain, but does endorse some shortness of breath.  Denies dizziness, lightheadedness, loss of consciousness, falls.  She states she has had an irregular heartbeat for years although this has not bothered her until this past year.  She does states she gets tired when walking from one side of the house to the other.  She also reports a history of anemia.  She had endoscopy sometime in the last few years  that showed AVMs, likely the cause of her anemia previously.  She states her appetite is normal.  She denies bloody emesis, blood in stool.  States her bowel movements are regular every 2 to 3 days and have been light brown in color, not black or red.  Regarding the patient's rash, she states she has had chickenpox before in her life.  She believes she has gotten the shingles vaccine, but she does not know for sure.  She was previously prescribed a short course of prednisone which she states improved the itching.  But this is returned now that the steroid course is over.  In the ED, she was initiated on cardizem gtt and received potassium supplementation in addition lasix IV 20mg .  Further, cardiology was consulted in the ED.   Review Of Systems: Per HPI.  Pertinent Past Medical History: T2DM HTN MI (1994) CHF Anemia - suspected source of blood loss colonic AVMs  Remainder reviewed in history tab.   Pertinent Past Surgical History: Abdominal hysterectomy  Remainder reviewed in history tab.   Pertinent Social History: Tobacco use: quit 40 years ago Alcohol use: seldom Other Substance use: None Lives with grandson  Pertinent Family History: Mother: stroke Daughter: anemia Remainder reviewed in history tab.   Important Outpatient Medications: Amlodipine 5 mg daily Lasix 40 mg (not in bag but claims to take it) Linaclotide 145 mcg Metformin 500 mg daily Metoprolol succinate 100 mg daily Pantoprazole 40mg  daily Potassium chloride 20 mEq (not in bag) Rosuvastatin 10 mg daily Valsartan 80 mg daily Remainder reviewed in medication history.   Objective: BP 112/66   Pulse (!) 103   Temp 98.5 F (36.9 C) (Oral)   Resp 16   Ht 5\' 1"  (1.549 m)   Wt 65.8 kg   SpO2 98%   BMI 27.40 kg/m  Exam: General: No acute distress, somewhat tearful Cardiovascular: Tachycardic.  Rhythm irregularly irregular. Respiratory: CTA bilaterally.  No increased work of breathing.  On room  air. Gastrointestinal: Normoactive bowel sounds.  Soft, nontender, nondistended. Extremities: 1+ pitting edema of BLEs. Moves all extremities equally.  Derm: On bilateral shoulders and upper back postinflammatory hyperpigmentation, trace excoriations.  No evidence of skin breakdown. Neuro: Alert and oriented x 3, speech normal in content. Psych: Cognition and judgment appear intact. Alert, communicative  and cooperative with normal attention span and concentration.  Labs:  CBC BMET  Recent Labs  Lab 06/28/23 0737  WBC 5.9  HGB 4.8*  HCT 17.8*  PLT 244   Recent Labs  Lab 06/28/23 0737  NA 137  K 2.9*  CL 104  CO2 21*  BUN 18  CREATININE 1.25*  GLUCOSE 135*  CALCIUM 8.0*    Troponins 198>>> 212 BNP 731  EKG: Irregularly irregular with significant tachycardia at rate of approximately 125 consistent with A-fib with RVR, no evidence of P waves, normal QRS interval, no evidence of axis deviation, singular PVC, no evidence of ST elevation  Imaging Studies Performed: 06/28/23 CXR: No acute findings. I have independently reviewed this imaging and agree with the radiologist interpretation.  Cyndia Skeeters, DO 06/28/2023, 5:33 PM PGY-1, [facility]  FPTS Intern pager: [phone], text pages welcome Secure chat group [facility] Teaching Service   I was personally present and performed or re-performed the history, physical exam and medical decision making activities of this service and have verified that the service and findings are accurately documented in the resident's note.  Shelby Mattocks, DO                  06/28/2023, 5:33 PM

## 2023-06-28 NOTE — ED Notes (Signed)
Blood consent signed by pt and at bedside.

## 2023-06-28 NOTE — Assessment & Plan Note (Signed)
Patient on metformin 500 mg daily at home.  Last A1c in 2022 was 6.3. -Hold metformin during hospitalization -Repeat A1c -Sensitive SSI ordered - CBGs 4 times daily before meals and at bedtime

## 2023-06-28 NOTE — Progress Notes (Signed)
Patient by RN who advised that patient is wanting to leave.  I went to visit patient for this discussion.  She is cold and notes that she got her blood transfusion and is not being treated for anything.  I thoroughly discussed her treatment plan and that she is being medically worked up for new A-fib and significant anemia and is currently being treated with medication for her abnormal heart rhythm in addition to rechecking blood counts after receiving transfusion.  Further specialists of cardiology and GI have been consulted.  I have made it very clear that she is risking her life should she decide to leave AMA.  After discussion, patient was unhappy but willing to stay in the hospital.  I do suspect she is having anxiety and have offered medication for assistance however she refused stating she does not have any "mental problems."  I have asked nurse to assist with temperature control as her discomfort is main driver for wanting to leave hospital.  Further to check on bed placement.  Patient is asked me to discuss her medical course and treatment with her daughter who would be the one to pick her up from the hospital.

## 2023-06-28 NOTE — Assessment & Plan Note (Signed)
Rash with itching to the patient's upper back/bilateral shoulders. Rash has been present for the patient has had several healthcare encounters guarding this issue.  She most recently was given a short course of prednisone which did seem to help some. - kenalog cream ordered PRN

## 2023-06-28 NOTE — Assessment & Plan Note (Addendum)
This is a chronic problem, ongoing for at least the last 5 years. Hgb today 4.8. 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  No obvious active bleeding on presentation today.  Patient denies hematemesis, blood in stool.  States she is iron deficient and has not been taking iron supplementation.  FOBT negative.  Receiving blood transfusion. -Posttransfusion H/H - Iron, TIBC, ferritin, B12, folate labs -Repeat CBC in a.m., consider additional transfusion as needed -Consult to GI to evaluate GI bleeding as culprit

## 2023-06-28 NOTE — Assessment & Plan Note (Signed)
Patient reports chronic constipation.  BM every 2 to 3 days.  No recent changes to this pattern.  Patient denies black or red stool. -MiraLAX as needed

## 2023-06-28 NOTE — Assessment & Plan Note (Signed)
Patient takes Lasix at home, though not immediately sure of the dose. -Lasix 20 mg given -Continue to monitor for signs of fluid overload

## 2023-06-28 NOTE — ED Notes (Signed)
Report from off going was that the pts trop was sent at 1445

## 2023-06-28 NOTE — ED Notes (Signed)
Pt given turkey sandwich and apple juice

## 2023-06-28 NOTE — ED Triage Notes (Signed)
Pt BIB EMS from home with initial c/o rash. Pt was seen at [facility] for rash and prescribed prednisone. EMS unable to locate rash. On arrival, pt's HR was in the 170s-180s afib with RVR. EMS administered 20mg  cardizem total (10mg  x2) and 250cc NS with some improvement. 20G L AC.

## 2023-06-28 NOTE — ED Notes (Signed)
Phlebotomist to draw repeat troponin, this RN unsuccessful with phlebotomy.

## 2023-06-28 NOTE — Assessment & Plan Note (Signed)
Patient takes Lasix at home, though not immediately sure of the dose, chart review reflects 20 mg daily. -Lasix 20 mg IV given -Continue to monitor for signs of fluid overload

## 2023-06-28 NOTE — ED Notes (Signed)
ED TO INPATIENT HANDOFF REPORT  ED Nurse Name and Phone #: chris [phone]  S Name/Age/Gender Jasmin Miller 82 y.o. female Room/Bed: 030C/030C  Code Status   Code Status: Full Code  Home/SNF/Other Home Patient oriented to: self, place, time, and situation Is this baseline? Yes   Triage Complete: Triage complete  Chief Complaint Atrial fibrillation [facility]) [I48.91]  Triage Note Pt BIB EMS from home with initial c/o rash. Pt was seen at [facility] for rash and prescribed prednisone. EMS unable to locate rash. On arrival, pt's HR was in the 170s-180s afib with RVR. EMS administered 20mg  cardizem total (10mg  x2) and 250cc NS with some improvement. 20G L AC.   Allergies No Known Allergies  Level of Care/Admitting Diagnosis ED Disposition     ED Disposition  Admit   Condition  --   Comment  Hospital Area: [facility] [[id number]]  Level of Care: Progressive [102]  Admit to Progressive based on following criteria: CARDIOVASCULAR & THORACIC of moderate stability with acute coronary syndrome symptoms/low risk myocardial infarction/hypertensive urgency/arrhythmias/heart failure potentially compromising stability and stable post cardiovascular intervention patients.  May admit patient to [facility] or [facility] if equivalent level of care is available:: No  Covid Evaluation: Asymptomatic - no recent exposure (last 10 days) testing not required  Diagnosis: Atrial fibrillation (HCC) [427.31.ICD-9-CM]  Admitting Physician: Cyndia Skeeters [[id number]]  Attending Physician: Billey Co [[id number]]  Certification:: I certify this patient will need inpatient services for at least 2 midnights  Estimated Length of Stay: 3          B Medical/Surgery History Past Medical History:  Diagnosis Date   DM2 (diabetes mellitus, type 2) (HCC)    Hypertension    MI (myocardial infarction) (HCC)    Symptomatic anemia    11/2018   Past Surgical History:  Procedure Laterality Date    ABDOMINAL HYSTERECTOMY     BIOPSY  07/12/2020   Procedure: BIOPSY;  Surgeon: Charlott Rakes, MD;  Location: [facility] ENDOSCOPY;  Service: Endoscopy;;   COLONOSCOPY  07/12/2020   COLONOSCOPY WITH PROPOFOL N/A 07/12/2020   Procedure: COLONOSCOPY WITH PROPOFOL;  Surgeon: Charlott Rakes, MD;  Location: [facility] ENDOSCOPY;  Service: Endoscopy;  Laterality: N/A;   ESOPHAGOGASTRODUODENOSCOPY  07/12/2020   ESOPHAGOGASTRODUODENOSCOPY N/A 07/12/2020   Procedure: ESOPHAGOGASTRODUODENOSCOPY (EGD);  Surgeon: Charlott Rakes, MD;  Location: [facility] ENDOSCOPY;  Service: Endoscopy;  Laterality: N/A;   ESOPHAGOGASTRODUODENOSCOPY N/A 04/19/2021   Procedure: ESOPHAGOGASTRODUODENOSCOPY (EGD);  Surgeon: Charlott Rakes, MD;  Location: [facility] ENDOSCOPY;  Service: Endoscopy;  Laterality: N/A;   HOT HEMOSTASIS N/A 07/12/2020   Procedure: HOT HEMOSTASIS (ARGON PLASMA COAGULATION/BICAP);  Surgeon: Charlott Rakes, MD;  Location: [facility] ENDOSCOPY;  Service: Endoscopy;  Laterality: N/A;  EGD and COLON     A IV Location/Drains/Wounds Patient Lines/Drains/Airways Status     Active Line/Drains/Airways     Name Placement date Placement time Site Days   Peripheral IV 06/28/23 20 G Left Antecubital 06/28/23  --  Antecubital  less than 1   Peripheral IV 06/28/23 20 G 1.88" Right Antecubital 06/28/23  1103  Antecubital  less than 1            Intake/Output Last 24 hours  Intake/Output Summary (Last 24 hours) at 06/28/2023 1721 Last data filed at 06/28/2023 1439 Gross per 24 hour  Intake 315 ml  Output --  Net 315 ml    Labs/Imaging Results for orders placed or performed during the hospital encounter of 06/28/23 (from the past 48 hour(s))  CBC with  Differential/Platelet     Status: Abnormal   Collection Time: 06/28/23  7:37 AM  Result Value Ref Range   WBC 5.9 4.0 - 10.5 K/uL   RBC 2.82 (L) 3.87 - 5.11 MIL/uL   Hemoglobin 4.8 (LL) 12.0 - 15.0 g/dL    Comment: REPEATED TO VERIFY Reticulocyte Hemoglobin testing may be  clinically indicated, consider ordering this additional test [id number] THIS CRITICAL RESULT HAS VERIFIED AND BEEN CALLED TO J. WARD, RN BY HAYLEE HOWARD ON 07 21 2024 AT 0810, AND HAS BEEN READ BACK.     HCT 17.8 (L) 36.0 - 46.0 %   MCV 63.1 (L) 80.0 - 100.0 fL   MCH 17.0 (L) 26.0 - 34.0 pg   MCHC 27.0 (L) 30.0 - 36.0 g/dL   RDW 62.9 (H) 52.8 - 41.3 %   Platelets 244 150 - 400 K/uL   nRBC 0.3 (H) 0.0 - 0.2 %   Neutrophils Relative % 68 %   Neutro Abs 4.0 1.7 - 7.7 K/uL   Lymphocytes Relative 16 %   Lymphs Abs 1.0 0.7 - 4.0 K/uL   Monocytes Relative 12 %   Monocytes Absolute 0.7 0.1 - 1.0 K/uL   Eosinophils Relative 4 %   Eosinophils Absolute 0.2 0.0 - 0.5 K/uL   Basophils Relative 0 %   Basophils Absolute 0.0 0.0 - 0.1 K/uL   Immature Granulocytes 0 %   Abs Immature Granulocytes 0.02 0.00 - 0.07 K/uL   Bite Cells PRESENT    Schistocytes PRESENT    Tear Drop Cells PRESENT     Comment: Performed at [facility], [address] [place], [place] [address]  Brain natriuretic peptide     Status: Abnormal   Collection Time: 06/28/23  7:37 AM  Result Value Ref Range   B Natriuretic Peptide 731.2 (H) 0.0 - 100.0 pg/mL    Comment: Performed at [facility], [address] [place], [place] [address]  Comprehensive metabolic panel     Status: Abnormal   Collection Time: 06/28/23  7:37 AM  Result Value Ref Range   Sodium 137 135 - 145 mmol/L   Potassium 2.9 (L) 3.5 - 5.1 mmol/L   Chloride 104 98 - 111 mmol/L   CO2 21 (L) 22 - 32 mmol/L   Glucose, Bld 135 (H) 70 - 99 mg/dL    Comment: Glucose reference range applies only to samples taken after fasting for at least 8 hours.   BUN 18 8 - 23 mg/dL   Creatinine, Ser 3.66 (H) 0.44 - 1.00 mg/dL   Calcium 8.0 (L) 8.9 - 10.3 mg/dL   Total Protein 5.7 (L) 6.5 - 8.1 g/dL   Albumin 3.0 (L) 3.5 - 5.0 g/dL   AST 18 15 - 41 U/L   ALT 15 0 - 44 U/L   Alkaline Phosphatase 69 38 - 126 U/L   Total Bilirubin 0.4 0.3 - 1.2 mg/dL   GFR,  Estimated 43 (L) >60 mL/min    Comment: (NOTE) Calculated using the CKD-EPI Creatinine Equation (2021)    Anion gap 12 5 - 15    Comment: Performed at [facility], [address], [place], [place] [address]  Troponin I (High Sensitivity)     Status: Abnormal   Collection Time: 06/28/23  7:37 AM  Result Value Ref Range   Troponin I (High Sensitivity) 198 (HH) <18 ng/L    Comment: CRITICAL RESULT CALLED TO, READ BACK BY AND VERIFIED WITH J,WARD RN @0900  06/28/23  E,BENTON (NOTE) Elevated high sensitivity troponin I (hsTnI) values and significant  changes across serial measurements may suggest ACS but many other  chronic and acute conditions are known to elevate hsTnI results.  Refer to the "Links" section for chest pain algorithms and additional  guidance. Performed at [facility], [address] [place], [place] [address]   Prepare RBC (crossmatch)     Status: None   Collection Time: 06/28/23  8:17 AM  Result Value Ref Range   Order Confirmation      ORDER PROCESSED BY BLOOD BANK Performed at [facility], [address] [place], [place] [address]   Type and screen [facility]     Status: None (Preliminary result)   Collection Time: 06/28/23  9:29 AM  Result Value Ref Range   ABO/RH(D) B POS    Antibody Screen POS    Sample Expiration 07/01/2023,2359    Antibody Identification ANTI E ANTI K    Unit Number [id number]    Blood Component Type RED CELLS,LR    Unit division 00    Status of Unit ISSUED    Donor AG Type NEGATIVE FOR E ANTIGEN NEGATIVE FOR KELL ANTIGEN    Transfusion Status OK TO TRANSFUSE    Crossmatch Result COMPATIBLE    Unit Number [id number]    Blood Component Type RED CELLS,LR    Unit division 00    Status of Unit ALLOCATED    Donor AG Type NEGATIVE FOR E ANTIGEN NEGATIVE FOR KELL ANTIGEN    Transfusion Status OK TO TRANSFUSE    Crossmatch Result COMPATIBLE   Troponin I (High Sensitivity)     Status: Abnormal    Collection Time: 06/28/23  9:29 AM  Result Value Ref Range   Troponin I (High Sensitivity) 212 (HH) <18 ng/L    Comment: CRITICAL VALUE NOTED. VALUE IS CONSISTENT WITH PREVIOUSLY REPORTED/CALLED VALUE (NOTE) Elevated high sensitivity troponin I (hsTnI) values and significant  changes across serial measurements may suggest ACS but many other  chronic and acute conditions are known to elevate hsTnI results.  Refer to the "Links" section for chest pain algorithms and additional  guidance. Performed at [facility], [address], [place], [place] [address]   POC occult blood, ED     Status: None   Collection Time: 06/28/23 10:32 AM  Result Value Ref Range   Fecal Occult Bld NEGATIVE NEGATIVE   DG Chest Port 1 View  Result Date: 06/28/2023 CLINICAL DATA:  doe, tachycardia EXAM: PORTABLE CHEST - 1 VIEW COMPARISON:  05/22/2023 FINDINGS: Lungs are clear. Heart size upper limits normal. Aortic Atherosclerosis (ICD10-170.0). No effusion. Bilateral shoulder DJD. IMPRESSION: No acute findings. Electronically Signed   By: Corlis Leak M.D.   On: 06/28/2023 09:06    Pending Labs Unresulted Labs (From admission, onward)     Start     Ordered   06/29/23 0500  Basic metabolic panel  Tomorrow morning,   R        06/28/23 1317   06/29/23 0500  CBC  Tomorrow morning,   R        06/28/23 1317   06/28/23 1700  Basic metabolic panel  Once,   R        06/28/23 1317   06/28/23 1700  Magnesium  Once,   R        06/28/23 1317   06/28/23 1700  Hemoglobin A1c  Once,   R  06/28/23 1317   06/28/23 1700  Ferritin  Once,   R        06/28/23 1317   06/28/23 1700  Iron and TIBC  Once,   R        06/28/23 1317   06/28/23 1700  Vitamin B12  Once,   R        06/28/23 1317   06/28/23 1700  Folate  Once,   R        06/28/23 1317   06/28/23 1700  TSH  Once,   R        06/28/23 1317            Vitals/Pain Today's Vitals   06/28/23 1140 06/28/23 1345 06/28/23 1415 06/28/23 1439  BP: 97/71  110/64 (!) 101/57 112/66  Pulse: (!) 113 (!) 46 78 (!) 103  Resp: 20 20  16   Temp: 98.2 F (36.8 C)   98.5 F (36.9 C)  TempSrc: Oral   Oral  SpO2: 100% 97% 97% 98%  Weight:      Height:      PainSc:        Isolation Precautions No active isolations  Medications Medications  diltiazem (CARDIZEM) 1 mg/mL load via infusion 10 mg (10 mg Intravenous Not Given 06/28/23 0746)    And  diltiazem (CARDIZEM) 125 mg in dextrose 5% 125 mL (1 mg/mL) infusion (10 mg/hr Intravenous Rate/Dose Change 06/28/23 0820)  0.9 %  sodium chloride infusion (Manually program via Guardrails IV Fluids) (0 mLs Intravenous Hold 06/28/23 1128)  rosuvastatin (CRESTOR) tablet 10 mg (has no administration in time range)  pantoprazole (PROTONIX) EC tablet 40 mg (40 mg Oral Not Given 06/28/23 1511)  triamcinolone cream (KENALOG) 0.1 % cream 1 Application (has no administration in time range)  potassium chloride (KLOR-CON) packet 40 mEq (40 mEq Oral Not Given 06/28/23 1510)  polyethylene glycol (MIRALAX / GLYCOLAX) packet 17 g (has no administration in time range)  insulin aspart (novoLOG) injection 0-9 Units (has no administration in time range)  furosemide (LASIX) injection 20 mg (20 mg Intravenous Given 06/28/23 1121)  potassium chloride SA (KLOR-CON M) CR tablet 40 mEq (40 mEq Oral Given 06/28/23 1022)    Mobility walks with device     Focused Assessments Cardiac Assessment Handoff:  Cardiac Rhythm: Atrial fibrillation Lab Results  Component Value Date   TROPONINI <0.03 10/03/2015   No results found for: "DDIMER" Does the Patient currently have chest pain? No    R Recommendations: See Admitting Provider Note  Report given to:   Additional Notes:

## 2023-06-28 NOTE — Assessment & Plan Note (Addendum)
New onset, stable. TSH WNL. Eho showed LVEF 40-45%. - Cardiology (Dr. Odis Hollingshead) consulted, appreciate recs - per Cardiology, wean off Cardizem drip as tolerated, begin metoprolol - awaiting anemia workup before considering anticoagulation - AM CBC/BMP  - Continue to trend tropes - Electrolyte goals of K> 4, Mg> 2; replete as necessary

## 2023-06-28 NOTE — Assessment & Plan Note (Addendum)
This is a chronic problem. Patient received 2U PRBCs yesterday and Hgb this AM is 8.1. - GI on board, plan for endoscopy tomorrow as long as Hgb remains above 7.5 - repeat CBC this evening to monitor - Repeat CBC in a.m., consider additional transfusion as needed - Iron, TIBC, ferritin, B12, folate labs - Consider IV iron infusion and potential hematology consult

## 2023-06-28 NOTE — Assessment & Plan Note (Signed)
Creatinine 1.59.  Baseline somewhat unclear, appears to be somewhere around 1.  Complicated in the setting of anemia and A-fib, receiving IV fluids with other therapies at this time. - AM BMP - Avoid nephrotoxic drugs

## 2023-06-28 NOTE — Progress Notes (Addendum)
FMTS Brief Progress Note  S: Went to see patient at bedside with Dr. Georg Ruddle.  Patient states that most bothersome symptoms currently are the burning/itching sensation on her back.  She states that she has some mild shortness of breath but is saturating well at 100% on room air.  She denies any chest pain or any abdominal pain or pain on her body.  Not diaphoretic, no nausea/vomiting.   I also called and spoke with her daughter per patient request Shaniquia and confirmed DOB. Discussed reasons for admission including significant anemia, A-fib with RVR.  All questions and concerns answered.  Unclear timeline on when patient will be discharged but discussed that she will need to be transition to oral medications and have her hemoglobin stable.  O: BP 108/68 (BP Location: Right Arm)   Pulse 99   Temp (!) 97 F (36.1 C) (Oral)   Resp 19   Ht 5\' 1"  (1.549 m)   Wt 61.8 kg   SpO2 100%   BMI 25.74 kg/m    General: Well appearing, NAD, awake, alert, responsive to questions Head: Normocephalic atraumatic CV: Irregular rhythm, rates 80s-90s Respiratory: Clear to ausculation bilaterally, no wheezes rales or crackles, chest rises symmetrically,  no increased work of breathing on room air saturating at 99-100% Extremities: Moves upper and lower extremities freely, no edema in LE Back: hyperpigmented scaly rash present  A/P: Anemia Significant anemia with hemoglobin to 4.8.  Received 1 unit of blood in ED.  Does have some subjective shortness of breath.  Likely in the setting of anemia.  Lung exam appears normal and she is saturating well on room air without increased work of breathing.  Anticipate needing another unit of blood tonight. -H&H to be obtained which was communicated with nursing at bedside  A-fib with RVR Rates have been controlled currently on diltiazem drip.  Blood pressures have been stable slightly on the lower side.  Telemetry reviewed and rate seems to be around 80s to  90s. -Monitor  Elevated troponin 198 > 212 > 224.  Denies any chest pain or atypical pain.  EKG had shown A-fib with RVR.  Cardiology believes likely demand in the setting of recent RVR and anemia -Repeat EKG if patient develops any chest pain/symptoms.  - Orders reviewed. Labs for AM ordered, which was adjusted as needed.   Rest of plan per H&P  Levin Erp, MD 06/28/2023, 9:12 PM PGY-3, [facility] Night Resident  Please page [phone] with questions.

## 2023-06-28 NOTE — Assessment & Plan Note (Signed)
Creatinine 1.25 on arrival.  Baseline somewhat unclear, appears to be somewhere around 1.  Patient receiving plenty of IV fluids with other therapies at this time. - Repeat BMP in a.m. - Avoid nephrotoxic drugs

## 2023-06-29 ENCOUNTER — Inpatient Hospital Stay (HOSPITAL_COMMUNITY): Payer: 59

## 2023-06-29 DIAGNOSIS — I4891 Unspecified atrial fibrillation: Secondary | ICD-10-CM | POA: Diagnosis not present

## 2023-06-29 DIAGNOSIS — D649 Anemia, unspecified: Secondary | ICD-10-CM | POA: Insufficient documentation

## 2023-06-29 LAB — ECHOCARDIOGRAM COMPLETE
Calc EF: 41 %
Height: 61 in
MV M vel: 4.72 m/s
MV Peak grad: 88.9 mmHg
Radius: 0.85 cm
S' Lateral: 4.6 cm
Single Plane A2C EF: 38.9 %
Single Plane A4C EF: 46.6 %
Weight: 2179.91 oz

## 2023-06-29 LAB — HEMOGLOBIN AND HEMATOCRIT, BLOOD
HCT: 26.8 % — ABNORMAL LOW (ref 36.0–46.0)
HCT: 26.8 % — ABNORMAL LOW (ref 36.0–46.0)
Hemoglobin: 8.1 g/dL — ABNORMAL LOW (ref 12.0–15.0)
Hemoglobin: 8 g/dL — ABNORMAL LOW (ref 12.0–15.0)

## 2023-06-29 LAB — CBC
HCT: 23.2 % — ABNORMAL LOW (ref 36.0–46.0)
Hemoglobin: 6.6 g/dL — CL (ref 12.0–15.0)
MCH: 19 pg — ABNORMAL LOW (ref 26.0–34.0)
MCHC: 28.4 g/dL — ABNORMAL LOW (ref 30.0–36.0)
MCV: 66.9 fL — ABNORMAL LOW (ref 80.0–100.0)
Platelets: 229 10*3/uL (ref 150–400)
RBC: 3.47 MIL/uL — ABNORMAL LOW (ref 3.87–5.11)
RDW: 23.3 % — ABNORMAL HIGH (ref 11.5–15.5)
WBC: 10 10*3/uL (ref 4.0–10.5)
nRBC: 1.5 % — ABNORMAL HIGH (ref 0.0–0.2)

## 2023-06-29 LAB — URINALYSIS, ROUTINE W REFLEX MICROSCOPIC
Bacteria, UA: NONE SEEN
Bilirubin Urine: NEGATIVE
Bilirubin Urine: NEGATIVE
Glucose, UA: NEGATIVE mg/dL
Glucose, UA: NEGATIVE mg/dL
Hgb urine dipstick: NEGATIVE
Hgb urine dipstick: NEGATIVE
Ketones, ur: NEGATIVE mg/dL
Ketones, ur: NEGATIVE mg/dL
Nitrite: NEGATIVE
Nitrite: NEGATIVE
Protein, ur: 30 mg/dL — AB
Protein, ur: 30 mg/dL — AB
Specific Gravity, Urine: 1.013 (ref 1.005–1.030)
Specific Gravity, Urine: 1.015 (ref 1.005–1.030)
pH: 5 (ref 5.0–8.0)
pH: 5 (ref 5.0–8.0)

## 2023-06-29 LAB — GLUCOSE, CAPILLARY
Glucose-Capillary: 121 mg/dL — ABNORMAL HIGH (ref 70–99)
Glucose-Capillary: 127 mg/dL — ABNORMAL HIGH (ref 70–99)
Glucose-Capillary: 129 mg/dL — ABNORMAL HIGH (ref 70–99)
Glucose-Capillary: 124 mg/dL — ABNORMAL HIGH (ref 70–99)

## 2023-06-29 LAB — BASIC METABOLIC PANEL
Anion gap: 11 (ref 5–15)
BUN: 21 mg/dL (ref 8–23)
CO2: 20 mmol/L — ABNORMAL LOW (ref 22–32)
Calcium: 8.5 mg/dL — ABNORMAL LOW (ref 8.9–10.3)
Chloride: 106 mmol/L (ref 98–111)
Creatinine, Ser: 1.59 mg/dL — ABNORMAL HIGH (ref 0.44–1.00)
GFR, Estimated: 32 mL/min — ABNORMAL LOW (ref >60)
Glucose, Bld: 139 mg/dL — ABNORMAL HIGH (ref 70–99)
Potassium: 3.9 mmol/L (ref 3.5–5.1)
Sodium: 137 mmol/L (ref 135–145)

## 2023-06-29 LAB — LIPID PANEL
Cholesterol: 97 mg/dL (ref 0–200)
HDL: 45 mg/dL (ref >40)
LDL Cholesterol: 42 mg/dL (ref 0–99)
Total CHOL/HDL Ratio: 2.2 RATIO
Triglycerides: 51 mg/dL (ref <150)
VLDL: 10 mg/dL (ref 0–40)

## 2023-06-29 LAB — LDL CHOLESTEROL, DIRECT: Direct LDL: 39 mg/dL (ref 0–99)

## 2023-06-29 LAB — BPAM RBC
Blood Product Expiration Date: 202408102359
ISSUE DATE / TIME: 202407211113
Unit Type and Rh: 7300
Unit Type and Rh: 7300

## 2023-06-29 LAB — MAGNESIUM: Magnesium: 2.4 mg/dL (ref 1.7–2.4)

## 2023-06-29 LAB — TROPONIN I (HIGH SENSITIVITY): Troponin I (High Sensitivity): 156 ng/L (ref <18)

## 2023-06-29 NOTE — Evaluation (Signed)
Occupational Therapy Evaluation Patient Details Name: Jasmin Miller MRN: [id number] DOB: June 16, 1941 Today's Date: 06/29/2023   History of Present Illness Pt is an 82 y/o F presenting to ED on 7/21 with tachycardia and new onset A fib RVR. 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   Clinical Impression   Pt independent at baseline with ADLs and uses cane PRN for mobility. Lives with 2 grandsons who can provide PRN assist. Pt currently limited by elevated HR (up to 145bpm during session), needing mod I - supervision for ADLs, mod I for bed mobility and supervision for transfers without AD. Pt reporting SOB with short distance ambulation around bed, SpO2 WNL on RA. Pt presenting with impairments listed below, will follow acutely. Recommend HHOT at d/c pending progression.     Recommendations for follow up therapy are one component of a multi-disciplinary discharge planning process, led by the attending physician.  Recommendations may be updated based on patient status, additional functional criteria and insurance authorization.   Assistance Recommended at Discharge Set up Supervision/Assistance  Patient can return home with the following A little help with walking and/or transfers;A little help with bathing/dressing/bathroom;Assistance with cooking/housework;Assist for transportation    Functional Status Assessment  Patient has had a recent decline in their functional status and demonstrates the ability to make significant improvements in function in a reasonable and predictable amount of time.  Equipment Recommendations  Tub/shower seat    Recommendations for Other Services PT consult     Precautions / Restrictions Precautions Precautions: Fall Restrictions Weight Bearing Restrictions: No      Mobility Bed Mobility Overal bed mobility: Modified Independent                  Transfers Overall transfer level: Needs assistance Equipment  used: None Transfers: Sit to/from Stand Sit to Stand: Supervision                  Balance Overall balance assessment: Mild deficits observed, not formally tested                                         ADL either performed or assessed with clinical judgement   ADL Overall ADL's : Needs assistance/impaired Eating/Feeding: Modified independent   Grooming: Supervision/safety   Upper Body Bathing: Supervision/ safety   Lower Body Bathing: Supervison/ safety   Upper Body Dressing : Supervision/safety   Lower Body Dressing: Supervision/safety   Toilet Transfer: Supervision/safety   Toileting- Clothing Manipulation and Hygiene: Supervision/safety       Functional mobility during ADLs: Supervision/safety       Vision Baseline Vision/History: 1 Wears glasses Vision Assessment?: No apparent visual deficits     Perception Perception Perception Tested?: No   Praxis Praxis Praxis tested?: Not tested    Pertinent Vitals/Pain Pain Assessment Pain Assessment: No/denies pain     Hand Dominance     Extremity/Trunk Assessment Upper Extremity Assessment Upper Extremity Assessment: Overall WFL for tasks assessed   Lower Extremity Assessment Lower Extremity Assessment: Defer to PT evaluation   Cervical / Trunk Assessment Cervical / Trunk Assessment: Normal   Communication Communication Communication: No difficulties   Cognition Arousal/Alertness: Awake/alert Behavior During Therapy: Flat affect, WFL for tasks assessed/performed Overall Cognitive Status: No family/caregiver present to determine baseline cognitive functioning  General Comments: some mild memory deficits noted, overall WFL     General Comments  HR up to 145bpm with short distance ambulation around bed    Exercises     Shoulder Instructions      Home Living Family/patient expects to be discharged to:: Private residence Living  Arrangements: Other relatives (grandsons) Available Help at Discharge: Family;Available PRN/intermittently Type of Home: House Home Access: Stairs to enter;Level entry     Home Layout: One level     Bathroom Shower/Tub: Tub/shower unit         Home Equipment: Cane - single point          Prior Functioning/Environment Prior Level of Function : Needs assist;Driving             Mobility Comments: uses cane PRN ADLs Comments: ind        OT Problem List: Decreased activity tolerance;Impaired balance (sitting and/or standing);Decreased safety awareness;Cardiopulmonary status limiting activity      OT Treatment/Interventions: Self-care/ADL training;Therapeutic exercise;Energy conservation;DME and/or AE instruction;Therapeutic activities;Patient/family education;Balance training    OT Goals(Current goals can be found in the care plan section) Acute Rehab OT Goals Patient Stated Goal: none stated OT Goal Formulation: With patient Time For Goal Achievement: 07/13/23 Potential to Achieve Goals: Good ADL Goals Pt Will Transfer to Toilet: with modified independence;regular height toilet;ambulating Pt Will Perform Tub/Shower Transfer: Tub transfer;Shower transfer;with modified independence;ambulating Additional ADL Goal #1: pt will verbalize x3 energy conservation strategies in order to improve activity tolerance for ADLs Additional ADL Goal #2: pt will be able to perform 2 standing functional tasks in order to improve activity tolerance for ADLs.  OT Frequency: Min 1X/week    Co-evaluation              AM-PAC OT "6 Clicks" Daily Activity     Outcome Measure Help from another person eating meals?: None Help from another person taking care of personal grooming?: None Help from another person toileting, which includes using toliet, bedpan, or urinal?: A Little Help from another person bathing (including washing, rinsing, drying)?: A Little Help from another person to  put on and taking off regular upper body clothing?: A Little Help from another person to put on and taking off regular lower body clothing?: A Little 6 Click Score: 20   End of Session Nurse Communication: Mobility status;Other (comment) (HR)  Activity Tolerance: Other (comment) (tx limited due to elevated HR) Patient left: in bed;with call bell/phone within reach;with bed alarm set  OT Visit Diagnosis: Unsteadiness on feet (R26.81);Other abnormalities of gait and mobility (R26.89);Muscle weakness (generalized) (M62.81)                Time: 1914-7829 OT Time Calculation (min): 18 min Charges:  OT General Charges $OT Visit: 1 Visit OT Evaluation $OT Eval Low Complexity: 1 Low  Carver Fila, OTD, OTR/L SecureChat Preferred Acute Rehab [phone]   Carver Fila Koonce 06/29/2023, 8:36 AM

## 2023-06-29 NOTE — Evaluation (Signed)
Physical Therapy Evaluation Patient Details Name: Jasmin Miller MRN: [id number] DOB: 1941-04-25 Today's Date: 06/29/2023  History of Present Illness  Pt is an 82 y/o F admitted on 06/28/23 after presenting with c/o a rash & found to be in a-fib with RVR. Pt also found to have Hgb of 4.8. PMH: DM2, HTN, HLD, a-fib vs wandering atrial pacemaker, HFpEF, MI  Clinical Impression  Pt seen for PT evaluation with pt agreeable with encouragement, as pt reports she just finished grooming tasks in room. Pt reports prior to admission she was independent with PRN use of QC, denies falls, performs all her cooking & cleaning & mows her own grass. On this date, pt is able to ambulate bed<>door without AD independently, no overt LOB. Pt limited by SOB with short distance gait. Pt declined further mobility at this time. Recommend ongoing PT services to address endurance/activity tolerance.        Assistance Recommended at Discharge PRN  If plan is discharge home, recommend the following:  Can travel by private vehicle  Assistance with cooking/housework        Equipment Recommendations None recommended by PT  Recommendations for Other Services       Functional Status Assessment Patient has had a recent decline in their functional status and demonstrates the ability to make significant improvements in function in a reasonable and predictable amount of time.     Precautions / Restrictions Precautions Precautions: Fall Restrictions Weight Bearing Restrictions: No      Mobility  Bed Mobility Overal bed mobility: Modified Independent             General bed mobility comments: supine<>sit with HOB elevated    Transfers Overall transfer level: Independent Equipment used: None Transfers: Sit to/from Stand Sit to Stand: Independent                Ambulation/Gait Ambulation/Gait assistance: Independent Gait Distance (Feet): 20 Feet Assistive device: None Gait Pattern/deviations:  Decreased stride length       General Gait Details: Pt ambulates bed<>door without AD with PT pushing IV pole. Pt without LOB.  Stairs            Wheelchair Mobility     Tilt Bed    Modified Rankin (Stroke Patients Only)       Balance Overall balance assessment: Mild deficits observed, not formally tested   Sitting balance-Leahy Scale: Normal     Standing balance support: No upper extremity supported, During functional activity Standing balance-Leahy Scale: Good                               Pertinent Vitals/Pain Pain Assessment Pain Assessment: No/denies pain    Home Living Family/patient expects to be discharged to:: Private residence Living Arrangements: Other relatives (grandsons) Available Help at Discharge: Family;Available PRN/intermittently Type of Home: House Home Access: Level entry       Home Layout: One level Home Equipment: Cane - quad      Prior Function Prior Level of Function : Independent/Modified Independent             Mobility Comments: Pt reports PRN use of QD but primarily independent without AD, driving, cleaning, cooking, mows her own yard. Denies falls in the past 6 years. ADLs Comments: ind     Hand Dominance        Extremity/Trunk Assessment   Upper Extremity Assessment Upper Extremity Assessment: Overall WFL for tasks assessed  Lower Extremity Assessment Lower Extremity Assessment: Overall WFL for tasks assessed    Cervical / Trunk Assessment Cervical / Trunk Assessment: Normal  Communication   Communication: No difficulties  Cognition Arousal/Alertness: Awake/alert Behavior During Therapy: Flat affect, WFL for tasks assessed/performed Overall Cognitive Status: Within Functional Limits for tasks assessed                                 General Comments: Pt not open to very much education, somewhat short with PT.        General Comments General comments (skin integrity,  edema, etc.): HR 96-127 bpm (HR elevated once sitting EOB after gait), pt c/o SOB but SpO2 >90% on room air, PT educates pt on pursed lip breathing to help alleviate symptoms.    Exercises     Assessment/Plan    PT Assessment Patient needs continued PT services  PT Problem List Cardiopulmonary status limiting activity;Decreased activity tolerance;Decreased balance;Decreased mobility;Decreased safety awareness       PT Treatment Interventions DME instruction;Therapeutic exercise;Balance training;Gait training;Stair training;Neuromuscular re-education;Functional mobility training;Therapeutic activities;Patient/family education    PT Goals (Current goals can be found in the Care Plan section)  Acute Rehab PT Goals Patient Stated Goal: go home PT Goal Formulation: With patient Time For Goal Achievement: 07/13/23 Potential to Achieve Goals: Good    Frequency Min 1X/week     Co-evaluation               AM-PAC PT "6 Clicks" Mobility  Outcome Measure Help needed turning from your back to your side while in a flat bed without using bedrails?: None Help needed moving from lying on your back to sitting on the side of a flat bed without using bedrails?: None Help needed moving to and from a bed to a chair (including a wheelchair)?: None Help needed standing up from a chair using your arms (e.g., wheelchair or bedside chair)?: None Help needed to walk in hospital room?: None Help needed climbing 3-5 steps with a railing? : None 6 Click Score: 24    End of Session   Activity Tolerance: Patient tolerated treatment well (pt self limiting, limited by SOB with gait) Patient left: in bed;with call bell/phone within reach;with bed alarm set Nurse Communication: Mobility status PT Visit Diagnosis: Other abnormalities of gait and mobility (R26.89)    Time: 9518-8416 PT Time Calculation (min) (ACUTE ONLY): 10 min   Charges:   PT Evaluation $PT Eval Low Complexity: 1 Low   PT  General Charges $$ ACUTE PT VISIT: 1 Visit         Aleda Grana, PT, DPT 06/29/23, 10:16 AM   Sandi Mariscal 06/29/2023, 10:15 AM

## 2023-06-29 NOTE — Progress Notes (Signed)
After blood transfusion in ER, repeat hemoglobin = 6.4. MD notified and order received for 1 unit blood. MD called to discuss with pt and pt agreeable to transfusion.   Pt has diltiazem drip infusing in left AC IV no problem. Right arm AC IV more positional and frequently beeping occluded when pt moves arm while IV magnesium infusing. Pt agitated and expressed frustration with IV beeping and trying to keep arm straight and tele monitor (beeping due to afib).   RN silenced tele monitor in room and consulted IV team for new IV not in [facility] for blood transfusion.   IV team came and attempted x 2 but unsuccessful. 2nd IV team RN came and inserted new IV to left forearm. MD updated on delay in starting blood due to IV.  Once IV in forearm, blood obtained and started without complications and pt appreciative of IV and tele no longer beeping.   Pt later expressing frustration d/t unable to get comfortable in bed. RN frequently helping readjust pillows and blankets.

## 2023-06-29 NOTE — Progress Notes (Signed)
Echocardiogram 2D Echocardiogram has been performed.  Warren Lacy Jezebel Pollet RDCS 06/29/2023, 9:19 AM

## 2023-06-29 NOTE — Plan of Care (Signed)
  Problem: Clinical Measurements: Goal: Ability to maintain clinical measurements within normal limits will improve Outcome: Progressing Goal: Will remain free from infection Outcome: Progressing Goal: Diagnostic test results will improve Outcome: Progressing Goal: Respiratory complications will improve Outcome: Progressing Goal: Cardiovascular complication will be avoided Outcome: Progressing   Problem: Activity: Goal: Risk for activity intolerance will decrease Outcome: Progressing   Problem: Pain Managment: Goal: General experience of comfort will improve Outcome: Progressing   Problem: Safety: Goal: Ability to remain free from injury will improve Outcome: Progressing   Problem: Education: Goal: Knowledge of disease or condition will improve Outcome: Progressing Goal: Understanding of medication regimen will improve Outcome: Progressing Goal: Individualized Educational Video(s) Outcome: Progressing

## 2023-06-29 NOTE — Progress Notes (Signed)
Daily Progress Note Intern Pager: [phone]  Patient name: Jasmin Miller Medical record number: [id number] Date of birth: 05/21/1941 Age: 82 y.o. Gender: female  Primary Care Provider: Lindaann Pascal, PA-C Consultants: Cardiology, GI Code Status: Full  Pt Overview and Major Events to Date:  7/21: Admitted to FMTS, received 2U PRBCs   Assessment and Plan: Jasmin Miller is a 82 y.o. female who presented with complaint of a rash, then found to be tachycardic and with new onset Afib with RVR.  Patient is stable.  Plan for endoscopy tomorrow morning.  [facility]     * (Principal) Atrial fibrillation with RVR (HCC)     New onset, stable. TSH WNL. Eho showed LVEF 40-45%. - Cardiology (Dr. Odis Hollingshead) consulted, appreciate recs - per Cardiology, wean off Cardizem drip as tolerated, begin metoprolol - awaiting anemia workup before considering anticoagulation - AM CBC/BMP  - Continue to trend tropes - Electrolyte goals of K> 4, Mg> 2; replete as necessary        DM2 (diabetes mellitus, type 2) (HCC)     Patient on metformin 500 mg daily at home.  A1c 6.5. -Hold metformin during hospitalization -Sensitive SSI ordered - CBGs 4 times daily before meals and at bedtime        AKI (acute kidney injury) (HCC)     Creatinine 1.59.  Baseline somewhat unclear, appears to be somewhere  around 1.  Complicated in the setting of anemia and A-fib, receiving IV  fluids with other therapies at this time. - AM BMP - Avoid nephrotoxic drugs        Anemia     This is a chronic problem. Patient received 2U PRBCs yesterday and Hgb  this AM is 8.1. - GI on board, plan for endoscopy tomorrow as long as Hgb remains above  7.5 - repeat CBC this evening to monitor - Repeat CBC in a.m., consider additional transfusion as needed - Iron, TIBC, ferritin, B12, folate labs - Consider IV iron infusion and potential hematology consult        CHF (congestive heart failure) (HCC)     Patient takes  Lasix at home, though not immediately sure of the dose,  chart review reflects 20 mg daily. - given one time Lasix 20 mg IV - Continue to monitor for signs of fluid overload      Chronic, stable conditions: Hyperlipidemia: Continue home rosuvastatin 10 mg daily Chronic constipation: MiraLAX daily as needed GERD: Protonix 40 mg daily Postinflammatory hyperpigmentation: Continue Kenalog cream as needed  FEN/GI: Regular diet PPx: SCDs Dispo:Home tomorrow. Barriers include clinical stability.   Subjective:  Patient seen resting in bed this morning prior to working with PT.  Her primary concern is her rash which she states is improving.  She denies headache, dizziness, shortness of breath, abdominal pain.  Objective: Temp:  [97 F (36.1 C)-98.9 F (37.2 C)] 98.2 F (36.8 C) (07/22 1130) Pulse Rate:  [46-108] 106 (07/22 1130) Resp:  [16-20] 18 (07/22 1130) BP: (94-120)/(57-89) 120/89 (07/22 1130) SpO2:  [93 %-100 %] 93 % (07/22 1130) Weight:  [61.8 kg] 61.8 kg (07/21 1828) Physical Exam: General: Pleasant, no acute distress Cardiovascular: RRR Respiratory: CTA bilaterally.  On room air. Abdomen: Soft, nontender, nondistended Extremities: Moves all extremities equally. Neuro: Alert and oriented Psych: Cooperative and communicative  Laboratory: Most recent CBC Lab Results  Component Value Date   WBC 10.0 06/29/2023   HGB 8.1 (L) 06/29/2023   HCT 26.8 (L)  06/29/2023   MCV 66.9 (L) 06/29/2023   PLT 229 06/29/2023   Most recent BMP    Latest Ref Rng & Units 06/29/2023   12:46 AM  BMP  Glucose 70 - 99 mg/dL 962   BUN 8 - 23 mg/dL 21   Creatinine 9.52 - 1.00 mg/dL 8.41   Sodium 324 - 401 mmol/L 137   Potassium 3.5 - 5.1 mmol/L 3.9   Chloride 98 - 111 mmol/L 106   CO2 22 - 32 mmol/L 20   Calcium 8.9 - 10.3 mg/dL 8.5    Magnesium 2.4  Echocardiogram: Tachycardic, irregularly irregular rhythm, no discernible P waves, normal QRS interval, no evidence of axis deviation  no evidence of ST elevation  Cyndia Skeeters, DO 06/29/2023, 1:11 PM  PGY-1, [facility] Family [facility] FPTS Intern pager: [phone], text pages welcome Secure chat group [facility] Teaching Service

## 2023-06-29 NOTE — Discharge Instructions (Addendum)
Dear Jasmin Miller,   Thank you for letting us participate in your care! In this section, you will find a brief hospital admission summary of why you were admitted to the hospital, what happened during your admission, your diagnosis/diagnoses, and recommended follow up.  Primary diagnosis: Atrial Fibrillation with RVR Treatment plan: You were started on new medications to help control your heart rate. These medications are outlined in your discharged paper. Please follow up with your cardiologist Secondary diagnosis: Anemia Treatment plan: You were seen by GI and underwent a capsule endoscopy. They did not find any active bleeding. We gave you IV iron prior to discharge and discharged you on iron supplementation.  You were also found to have a superficial blood clot in your arm. You should elevate this arm on pillows at home and you can ice it for pain. If the pain or swelling gets worse, please call your PCP to get a follow-up appointment as you may need a follow-up ultrasound.    POST-HOSPITAL & CARE INSTRUCTIONS We recommend following up with your PCP within 1 week from being discharged from the hospital. Please follow closely with your Cardiologist and Gastroenterologist as needed  Please let PCP/Specialists know of any changes in medications that were made which you will be able to see in the medications section of this packet  DOCTOR'S APPOINTMENTS & FOLLOW UP Future Appointments  Date Time Provider Department Center  07/20/2023  3:45 PM Tessa Lerner, DO [facility] None    RETURN PRECAUTIONS: If you experience bleeding, chest pain, difficulty breathing please contact your PCP or return to the emergency department immediately for evaluation.  [facility] you for choosing [facility]! Take care and be well!  Family Medicine Teaching Service Inpatient Team [place]  [facility]  [address] [place], [place] [address] [phone]  Information on my medicine -  ELIQUIS (apixaban)  Why was Eliquis prescribed for you? Eliquis was prescribed for you to reduce the risk of a blood clot forming that can cause a stroke if you have a medical condition called atrial fibrillation (a type of irregular heartbeat).  What do You need to know about Eliquis ? Take your Eliquis TWICE DAILY - one tablet in the morning and one tablet in the evening with or without food. If you have difficulty swallowing the tablet whole please discuss with your pharmacist how to take the medication safely.  Take Eliquis exactly as prescribed by your doctor and DO NOT stop taking Eliquis without talking to the doctor who prescribed the medication.  Stopping may increase your risk of developing a stroke.  Refill your prescription before you run out.  After discharge, you should have regular check-up appointments with your healthcare provider that is prescribing your Eliquis.  In the future your dose may need to be changed if your kidney function or weight changes by a significant amount or as you get older.  What do you do if you miss a dose? If you miss a dose, take it as soon as you remember on the same day and resume taking twice daily.  Do not take more than one dose of ELIQUIS at the same time to make up a missed dose.  Important Safety Information A possible side effect of Eliquis is bleeding. You should call your healthcare provider right away if you experience any of the following: Bleeding from an injury or your nose that does not stop. Unusual colored urine (red or dark brown) or unusual colored  stools (red or black). Unusual bruising for unknown reasons. A serious fall or if you hit your head (even if there is no bleeding).  Some medicines may interact with Eliquis and might increase your risk of bleeding or clotting while on Eliquis. To help avoid this, consult your healthcare provider or pharmacist prior to using any new prescription or non-prescription  medications, including herbals, vitamins, non-steroidal anti-inflammatory drugs (NSAIDs) and supplements.  This website has more information on Eliquis (apixaban): http://www.eliquis.com/eliquis/home

## 2023-06-29 NOTE — Plan of Care (Signed)
Problem: Education: Goal: Ability to describe self-care measures that may prevent or decrease complications (Diabetes Survival Skills Education) will improve Outcome: Progressing Goal: Individualized Educational Video(s) Outcome: Progressing   Problem: Coping: Goal: Ability to adjust to condition or change in health will improve Outcome: Progressing   Problem: Fluid Volume: Goal: Ability to maintain a balanced intake and output will improve Outcome: Progressing   Problem: Health Behavior/Discharge Planning: Goal: Ability to identify and utilize available resources and services will improve Outcome: Progressing Goal: Ability to manage health-related needs will improve Outcome: Progressing   Problem: Metabolic: Goal: Ability to maintain appropriate glucose levels will improve Outcome: Progressing   Problem: Nutritional: Goal: Maintenance of adequate nutrition will improve Outcome: Progressing Goal: Progress toward achieving an optimal weight will improve Outcome: Progressing   Problem: Skin Integrity: Goal: Risk for impaired skin integrity will decrease Outcome: Progressing   Problem: Tissue Perfusion: Goal: Adequacy of tissue perfusion will improve Outcome: Progressing   Problem: Education: Goal: Knowledge of General Education information will improve Description: Including pain rating scale, medication(s)/side effects and non-pharmacologic comfort measures Outcome: Progressing   Problem: Health Behavior/Discharge Planning: Goal: Ability to manage health-related needs will improve Outcome: Progressing   Problem: Clinical Measurements: Goal: Ability to maintain clinical measurements within normal limits will improve Outcome: Progressing Goal: Will remain free from infection Outcome: Progressing Goal: Diagnostic test results will improve Outcome: Progressing Goal: Respiratory complications will improve Outcome: Progressing Goal: Cardiovascular complication will  be avoided Outcome: Progressing   Problem: Activity: Goal: Risk for activity intolerance will decrease Outcome: Progressing   Problem: Nutrition: Goal: Adequate nutrition will be maintained Outcome: Progressing   Problem: Coping: Goal: Level of anxiety will decrease Outcome: Progressing   Problem: Elimination: Goal: Will not experience complications related to bowel motility Outcome: Progressing Goal: Will not experience complications related to urinary retention Outcome: Progressing   Problem: Pain Managment: Goal: General experience of comfort will improve Outcome: Progressing   Problem: Safety: Goal: Ability to remain free from injury will improve Outcome: Progressing   Problem: Skin Integrity: Goal: Risk for impaired skin integrity will decrease Outcome: Progressing   Problem: Education: Goal: Knowledge of disease or condition will improve Outcome: Progressing Goal: Understanding of medication regimen will improve Outcome: Progressing Goal: Individualized Educational Video(s) Outcome: Progressing   Problem: Activity: Goal: Ability to tolerate increased activity will improve Outcome: Progressing   Problem: Education: Goal: Knowledge of General Education information will improve Description: Including pain rating scale, medication(s)/side effects and non-pharmacologic comfort measures Outcome: Progressing   Problem: Health Behavior/Discharge Planning: Goal: Ability to manage health-related needs will improve Outcome: Progressing   Problem: Clinical Measurements: Goal: Ability to maintain clinical measurements within normal limits will improve Outcome: Progressing Goal: Will remain free from infection Outcome: Progressing Goal: Diagnostic test results will improve Outcome: Progressing Goal: Respiratory complications will improve Outcome: Progressing Goal: Cardiovascular complication will be avoided Outcome: Progressing   Problem: Activity: Goal:  Risk for activity intolerance will decrease Outcome: Progressing   Problem: Nutrition: Goal: Adequate nutrition will be maintained Outcome: Progressing   Problem: Coping: Goal: Level of anxiety will decrease Outcome: Progressing   Problem: Elimination: Goal: Will not experience complications related to bowel motility Outcome: Progressing Goal: Will not experience complications related to urinary retention Outcome: Progressing   Problem: Pain Managment: Goal: General experience of comfort will improve Outcome: Progressing   Problem: Safety: Goal: Ability to remain free from injury will improve Outcome: Progressing   Problem: Skin Integrity: Goal: Risk for impaired skin integrity will decrease Outcome:  Progressing

## 2023-06-29 NOTE — Progress Notes (Signed)
Progress Note  Patient Name: Jasmin Miller MRN: [id number] DOB: [birth year] Date of Encounter: 06/29/2023  Attending physician: Billey Co, MD Primary care provider: Lindaann Pascal, PA-C Primary Cardiologist: Dr. Truett Mainland  Subjective: Jasmin Miller is a 82 y.o. African-American female who was seen and examined at bedside  Denies anginal chest pain. Shortness of breath improved Ventricular rate improved. Status post blood transfusion. No family at bedside  Objective: Vital Signs in the last 24 hours: Temp:  [97 F (36.1 C)-98.9 F (37.2 C)] 98.6 F (37 C) (07/22 0807) Pulse Rate:  [46-142] 108 (07/22 0807) Resp:  [16-23] 16 (07/22 0807) BP: (94-115)/(57-89) 115/89 (07/22 0807) SpO2:  [94 %-100 %] 97 % (07/22 0807) Weight:  [61.8 kg] 61.8 kg (07/21 1828)  Intake/Output:  Intake/Output Summary (Last 24 hours) at 06/29/2023 0952 Last data filed at 06/29/2023 0826 Gross per 24 hour  Intake 1184.9 ml  Output 100 ml  Net 1084.9 ml    Net IO Since Admission: 1,084.9 mL [06/29/23 0952]  Weights:     06/28/2023    6:28 PM 06/28/2023    7:27 AM 10/17/2021   12:59 PM  Last 3 Weights  Weight (lbs) 136 lb 3.9 oz 145 lb 138 lb 6.4 oz  Weight (kg) 61.8 kg 65.772 kg 62.778 kg      Telemetry:  Overnight telemetry shows AFib rate better controlled , which I personally reviewed.   Physical examination: PHYSICAL EXAM: Vitals:   06/29/23 0234 06/29/23 0340 06/29/23 0725 06/29/23 0807  BP: 95/70 94/61 104/66 115/89  Pulse: 79 73 (!) 105 (!) 108  Resp:  18 17 16   Temp:  97.9 F (36.6 C) 98.9 F (37.2 C) 98.6 F (37 C)  TempSrc:  Oral Oral Oral  SpO2: 97% 94% 96% 97%  Weight:      Height:        Physical Exam  Constitutional: No distress.  Age appropriate, hemodynamically stable.   Neck: No JVD present.  Cardiovascular: Normal rate, S1 normal, S2 normal, intact distal pulses and normal pulses. An irregularly irregular rhythm present. Exam reveals no gallop,  no S3 and no S4.  Murmur heard. Systolic murmur is present with a grade of 3/6. Pulmonary/Chest: Effort normal and breath sounds normal. No stridor. She has no wheezes. She has no rales.  Abdominal: Soft. Bowel sounds are normal. She exhibits no distension. There is no abdominal tenderness.  Musculoskeletal:        General: No edema.     Cervical back: Neck supple.  Neurological: She is alert and oriented to person, place, and time. She has intact cranial nerves (2-12).  Skin: Skin is warm and moist.   Lab Results: Chemistry Recent Labs  Lab 06/28/23 0737 06/28/23 1703 06/29/23 0046  NA 137 140 137  K 2.9* 3.9 3.9  CL 104 102 106  CO2 21* 20* 20*  GLUCOSE 135* 182* 139*  BUN 18 18 21   CREATININE 1.25* 1.40* 1.59*  CALCIUM 8.0* 8.6* 8.5*  PROT 5.7*  --   --   ALBUMIN 3.0*  --   --   AST 18  --   --   ALT 15  --   --   ALKPHOS 69  --   --   BILITOT 0.4  --   --   GFRNONAA 43* 38* 32*  ANIONGAP 12 18* 11    Hematology Recent Labs  Lab 06/28/23 0737 06/28/23 2103 06/29/23 0130 06/29/23 0616  WBC 5.9  --  10.0  --  RBC 2.82*  --  3.47*  --   HGB 4.8* 6.4* 6.6* 8.1*  HCT 17.8* 22.5* 23.2* 26.8*  MCV 63.1*  --  66.9*  --   MCH 17.0*  --  19.0*  --   MCHC 27.0*  --  28.4*  --   RDW 21.2*  --  23.3*  --   PLT 244  --  229  --    High Sensitivity Troponin:   Recent Labs  Lab 06/28/23 0737 06/28/23 0929 06/28/23 1703 06/28/23 2102  TROPONINIHS 198* 212* 224* 235*     Cardiac EnzymesNo results for input(s): "TROPONINI" in the last 168 hours. No results for input(s): "TROPIPOC" in the last 168 hours.  BNP Recent Labs  Lab 06/28/23 0737  BNP 731.2*    DDimer No results for input(s): "DDIMER" in the last 168 hours.  Hemoglobin A1c:  Lab Results  Component Value Date   HGBA1C 6.5 (H) 06/28/2023   MPG 139.85 06/28/2023   TSH  Recent Labs    06/28/23 1839  TSH 3.235   Lipid Panel  Lab Results  Component Value Date   CHOL 97 06/29/2023   HDL 45  06/29/2023   LDLCALC 42 06/29/2023   LDLDIRECT 39 06/29/2023   TRIG 51 06/29/2023   CHOLHDL 2.2 06/29/2023   Drugs of Abuse     Component Value Date/Time   LABOPIA NONE DETECTED 10/03/2015 1313   COCAINSCRNUR NONE DETECTED 10/03/2015 1313   LABBENZ NONE DETECTED 10/03/2015 1313   AMPHETMU NONE DETECTED 10/03/2015 1313   THCU NONE DETECTED 10/03/2015 1313   LABBARB NONE DETECTED 10/03/2015 1313      Imaging: DG Chest Port 1 View  Result Date: 06/28/2023 CLINICAL DATA:  doe, tachycardia EXAM: PORTABLE CHEST - 1 VIEW COMPARISON:  05/22/2023 FINDINGS: Lungs are clear. Heart size upper limits normal. Aortic Atherosclerosis (ICD10-170.0). No effusion. Bilateral shoulder DJD. IMPRESSION: No acute findings. Electronically Signed   By: Corlis Leak M.D.   On: 06/28/2023 09:06    CARDIAC DATABASE: EKG: June 28, 2023: Atrial fibrillation, 127 bpm, poor R wave progression, without underlying injury pattern, frequent PVCs.   Echocardiogram:  04/19/2021:   1. Left ventricular ejection fraction, by estimation, is 50%. The left ventricle has mildly decreased function. The left ventricle demonstrates regional wall motion abnormalities with severe hypokinesis of the basal inferior and basal inferolateral  walls, hypokinesis of the basal anterolateral wall. Left ventricular diastolic parameters are consistent with Grade II diastolic dysfunction (pseudonormalization).  2. Right ventricular systolic function is mildly reduced. The right ventricular size is normal. There is normal pulmonary artery systolic pressure. The estimated right ventricular systolic pressure is 29.3 mmHg.  3. Left atrial size was severely dilated.  4. Right atrial size was mildly dilated.  5. The mitral valve is abnormal. At least moderate to possibly severe mitral valve regurgitation. This may not be fully visualized. Possible infarct-related mitral regurgitation given regional wall motion abnormalities. No evidence of mitral  stenosis.  6. The aortic valve is tricuspid. Aortic valve regurgitation is not visualized. Mild aortic valve sclerosis is present, with no evidence of aortic valve stenosis.  7. The inferior vena cava is dilated in size with <50% respiratory variability, suggesting right atrial pressure of 15 mmHg.  Scheduled Meds:  sodium chloride   Intravenous Once   sodium chloride   Intravenous Once   diltiazem  10 mg Intravenous Once   insulin aspart  0-9 Units Subcutaneous TID WC   metoprolol succinate  100 mg Oral  Daily   pantoprazole  40 mg Oral Daily   potassium chloride  40 mEq Oral Once   rosuvastatin  10 mg Oral Daily    Continuous Infusions:  diltiazem (CARDIZEM) infusion 5 mg/hr (06/29/23 0342)    PRN Meds: mouth rinse, polyethylene glycol, triamcinolone cream   IMPRESSION & RECOMMENDATIONS: Jasmin Miller is a 82 y.o. 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   Impression: Atrial fibrillation, newly discovered Symptomatic anemia status post blood transfusion. Hypokalemia-resolved. Premature ventricular contractions. Known history of grade 2 diastolic dysfunction History of myocardial infarction. Hypertension. Elevated high-sensitivity troponins. Elevated BNP. Non-insulin-dependent diabetes mellitus type 2. Former smoker  Recommendations: Newly discovered atrial fibrillation: In A-fib with RVR on presentation with correcting her hemoglobin the ventricular rates have improved. Chronicity of A-fib is unknown.  However based on prior echocardiogram results left atrium size is severely dilated. Wean off Cardizem drip as tolerated. Rate control: Metoprolol. Rhythm control: N/A. Thromboembolic prophylaxis none in the setting of severe symptomatic anemia with a hemoglobin of 4.8 g/dL on arrival. [id number] SCORE is  5 which correlates to 7.2% risk of stroke per year (HTN, diabetes, age, gender).  TSH within normal limits. Electrolytes have been corrected. Will focus on rate control strategy until anemia workup is complete and she is cleared to be on anticoagulation. Echocardiogram pending  Symptomatic anemia: Hemoglobin on arrival 4.8 g/dL Status post blood transfusions. History of AVM Recommend GI consult to evaluate the source of the current bleed until then would not recommend anticoagulation in the setting of severe symptomatic anemia. Would be a great candidate for left atrial appendage occlusion device, i.e. Watchman -evaluation as outpatient  Premature ventricular contractions: Improving. Continue telemetry  History of myocardial infarction. Known history of grade 2 diastolic dysfunction. Elevated high sensitive troponins. Elevated BNP Patient is at risk of developing acute heart failure for reasons mentioned above. However, the recent elevation in high sensitive troponins are likely secondary to supply demand ischemia due to severe symptomatic anemia of 4.8 g/dL and A-fib with RVR.  Patient does not have any active chest pain to suggest angina.  In the setting of her anemia she is not a great candidate for antiplatelets as well.  Would recommend complete GI evaluation prior to initiating ischemic workup in an asymptomatic patient.  Was given IV Lasix.  For now we will hold diuretics given soft blood pressures. Echo pending  Patient's questions and concerns were addressed to her satisfaction. She voices understanding of the instructions provided during this encounter.   This note was created using a voice recognition software as a result there may be grammatical errors inadvertently enclosed that do not reflect the nature of this encounter. Every attempt is made to correct such errors.  Delilah Shan [facility] [address]  Pager:  [phone] Office: [phone] 06/29/2023, 9:52 AM

## 2023-06-29 NOTE — H&P (View-Only) (Signed)
[facility] Consultation Note  Referring Provider: [facility] Primary Care Physician:  Lindaann Pascal, PA-C Primary Gastroenterologist:  Dr. Bosie Clos  Reason for Consultation:  Anemia  HPI: Jasmin Miller is a 82 y.o. female admitted weakness, shortness of breath.  Profound anemia.  FOBT negative, no melena, no hematemesis, no hematochezia.  No blood thinners.  New atrial fibrillation with RVR.  Prior colonoscopy 2021 and endoscopy x 2 (2021, 2022), all for anemia.  No abdominal pain, change in bowel habits, unintentional weight loss.  Saw Dr. Leonides Schanz couple years ago for IDA and consideration of iron infusion, doesn't seem like patient was compliant with this.   Past Medical History:  Diagnosis Date   DM2 (diabetes mellitus, type 2) (HCC)    Hypertension    MI (myocardial infarction) (HCC)    Symptomatic anemia    11/2018    Past Surgical History:  Procedure Laterality Date   ABDOMINAL HYSTERECTOMY     BIOPSY  07/12/2020   Procedure: BIOPSY;  Surgeon: Charlott Rakes, MD;  Location: [facility] ENDOSCOPY;  Service: Endoscopy;;   COLONOSCOPY  07/12/2020   COLONOSCOPY WITH PROPOFOL N/A 07/12/2020   Procedure: COLONOSCOPY WITH PROPOFOL;  Surgeon: Charlott Rakes, MD;  Location: [facility] ENDOSCOPY;  Service: Endoscopy;  Laterality: N/A;   ESOPHAGOGASTRODUODENOSCOPY  07/12/2020   ESOPHAGOGASTRODUODENOSCOPY N/A 07/12/2020   Procedure: ESOPHAGOGASTRODUODENOSCOPY (EGD);  Surgeon: Charlott Rakes, MD;  Location: [facility] ENDOSCOPY;  Service: Endoscopy;  Laterality: N/A;   ESOPHAGOGASTRODUODENOSCOPY N/A 04/19/2021   Procedure: ESOPHAGOGASTRODUODENOSCOPY (EGD);  Surgeon: Charlott Rakes, MD;  Location: [facility] ENDOSCOPY;  Service: Endoscopy;  Laterality: N/A;   HOT HEMOSTASIS N/A 07/12/2020   Procedure: HOT HEMOSTASIS (ARGON PLASMA COAGULATION/BICAP);  Surgeon: Charlott Rakes, MD;  Location: [facility] ENDOSCOPY;  Service: Endoscopy;  Laterality: N/A;  EGD and COLON    Prior to Admission medications    Medication Sig Start Date End Date Taking? Authorizing Provider  amLODipine (NORVASC) 5 MG tablet TAKE 1 TABLET(5 MG) BY MOUTH DAILY Patient taking differently: Take 5 mg by mouth every evening. 12/23/21  Yes Cantwell, Celeste C, PA-C  Ferrous Sulfate Dried (HIGH POTENCY IRON) 65 MG TABS Take 1 capsule by mouth daily.   Yes [provider]  furosemide (LASIX) 40 MG tablet Take 1 tablet (40 mg total) by mouth daily. Patient taking differently: Take 40 mg by mouth as needed for fluid or edema. 09/11/21  Yes Cantwell, Celeste C, PA-C  metFORMIN (GLUCOPHAGE-XR) 500 MG 24 hr tablet Take 500 mg by mouth every evening. 04/02/23  Yes [provider]  metoprolol succinate (TOPROL-XL) 100 MG 24 hr tablet Take 1 tablet (100 mg total) by mouth at bedtime. Take with or immediately following a meal. Patient taking differently: Take 100 mg by mouth daily. Take with or immediately following a meal. 08/23/21  Yes Cantwell, Celeste C, PA-C  pantoprazole (PROTONIX) 40 MG tablet Take 40 mg by mouth daily.   Yes [provider]  Potassium Chloride ER 20 MEQ TBCR Take 20 mEq by mouth daily. 08/23/21  Yes Cantwell, Celeste C, PA-C  rosuvastatin (CRESTOR) 10 MG tablet Take 1 tablet (10 mg total) by mouth daily. 08/23/21  Yes Cantwell, Celeste C, PA-C  triamcinolone cream (KENALOG) 0.1 % Apply 1 application topically every 8 (eight) hours as needed (itching).    Yes [provider]  valsartan (DIOVAN) 80 MG tablet Take 80 mg by mouth daily.   Yes [provider]    Current Facility-Administered Medications  Medication Dose Route Frequency Provider Last Rate Last Admin  0.9 %  sodium chloride infusion (Manually program via Guardrails IV Fluids)   Intravenous Once Cyndia Skeeters, DO   Held at 06/28/23 1128   0.9 %  sodium chloride infusion (Manually program via Guardrails IV Fluids)   Intravenous Once Levin Erp, MD       diltiazem (CARDIZEM) 1 mg/mL load via infusion 10 mg   10 mg Intravenous Once Spence, Sarah, DO       And   diltiazem (CARDIZEM) 125 mg in dextrose 5% 125 mL (1 mg/mL) infusion  5-15 mg/hr Intravenous Continuous Cyndia Skeeters, DO 5 mL/hr at 06/29/23 0342 5 mg/hr at 06/29/23 0342   insulin aspart (novoLOG) injection 0-9 Units  0-9 Units Subcutaneous TID WC Cyndia Skeeters, DO   2 Units at 06/28/23 1849   metoprolol succinate (TOPROL-XL) 24 hr tablet 100 mg  100 mg Oral Daily Tolia, Sunit, DO   100 mg at 06/29/23 1191   Oral care mouth rinse  15 mL Mouth Rinse PRN Billey Co, MD       pantoprazole (PROTONIX) EC tablet 40 mg  40 mg Oral Daily Cyndia Skeeters, DO   40 mg at 06/29/23 0802   polyethylene glycol (MIRALAX / GLYCOLAX) packet 17 g  17 g Oral Daily PRN Cyndia Skeeters, DO       potassium chloride (KLOR-CON) packet 40 mEq  40 mEq Oral Once Spence, Sarah, DO       rosuvastatin (CRESTOR) tablet 10 mg  10 mg Oral Daily Cyndia Skeeters, DO   10 mg at 06/29/23 4782   triamcinolone cream (KENALOG) 0.1 % cream 1 Application  1 Application Topical Q8H PRN Cyndia Skeeters, DO   1 Application at 06/28/23 2324    Allergies as of 06/28/2023   (No Known Allergies)    Family History  Problem Relation Age of Onset   Anemia Mother    Stroke Mother    Anemia Daughter     Social History   Socioeconomic History   Marital status: Widowed    Spouse name: Not on file   Number of children: 4   Years of education: Not on file   Highest education level: Not on file  Occupational History   Not on file  Tobacco Use   Smoking status: Former    Current packs/day: 0.00    Average packs/day: 1 pack/day for 29.0 years (29.0 ttl pk-yrs)    Types: Cigarettes    Start date: 82    Quit date: 10    Years since quitting: 31.5   Smokeless tobacco: Never   Tobacco comments:    SMOKED FOR 29 YEARS   Vaping Use   Vaping status: Never Used  Substance and Sexual Activity   Alcohol use: No   Drug use: No   Sexual activity: Not on file  Other Topics Concern    Not on file  Social History Narrative   Not on file   Social Determinants of Health   Financial Resource Strain: Not on file  Food Insecurity: No Food Insecurity (06/28/2023)   Hunger Vital Sign    Worried About Running Out of Food in the Last Year: Never true    Ran Out of Food in the Last Year: Never true  Transportation Needs: No Transportation Needs (06/28/2023)   PRAPARE - Administrator, Civil Service (Medical): No    Lack of Transportation (Non-Medical): No  Physical Activity: Not on file  Stress: Not on file  Social Connections: Not on file  Intimate Partner Violence: Patient Declined (06/28/2023)   Humiliation, Afraid, Rape, and Kick questionnaire    Fear of Current or Ex-Partner: Patient declined    Emotionally Abused: Patient declined    Physically Abused: Patient declined    Sexually Abused: Patient declined    Review of Systems: As per HPI, all others negative  Physical Exam: Vital signs in last 24 hours: Temp:  [97 F (36.1 C)-98.9 F (37.2 C)] 98.6 F (37 C) (07/22 0807) Pulse Rate:  [46-142] 108 (07/22 0807) Resp:  [16-23] 16 (07/22 0807) BP: (94-115)/(57-89) 115/89 (07/22 0807) SpO2:  [94 %-100 %] 97 % (07/22 0807) Weight:  [61.8 kg] 61.8 kg (07/21 1828) Last BM Date : 06/27/23 General:   Alert,  weak-appearing, deconditioned-appearing, otherwise cooperative in NAD Head:  Normocephalic and atraumatic. Eyes:  Sclera clear, no icterus.   Conjunctiva pale Ears:  Normal auditory acuity. Nose:  No deformity, discharge,  or lesions. Mouth:  No deformity or lesions.  Oropharynx pale and dry Neck:  Supple; no masses or thyromegaly. Lungs:  Dyspneic at rest CV:  Modestly tachycardic Abdomen:  Soft, nontender and nondistended. No masses, hepatosplenomegaly or hernias noted. Normal bowel sounds, without guarding, and without rebound.     Msk:  Symmetrical without gross deformities. Normal posture. Pulses:  Normal pulses noted. Extremities:  Without  clubbing or edema. Neurologic:  Alert and  oriented x4; diffusely weak otherwise grossly normal neurologically. Skin:  Intact without significant lesions or rashes. Psych:  Alert and cooperative. Normal mood and affect.   Lab Results: Recent Labs    06/28/23 0737 06/28/23 2103 06/29/23 0130 06/29/23 0616  WBC 5.9  --  10.0  --   HGB 4.8* 6.4* 6.6* 8.1*  HCT 17.8* 22.5* 23.2* 26.8*  PLT 244  --  229  --    BMET Recent Labs    06/28/23 0737 06/28/23 1703 06/29/23 0046  NA 137 140 137  K 2.9* 3.9 3.9  CL 104 102 106  CO2 21* 20* 20*  GLUCOSE 135* 182* 139*  BUN 18 18 21   CREATININE 1.25* 1.40* 1.59*  CALCIUM 8.0* 8.6* 8.5*   LFT Recent Labs    06/28/23 0737  PROT 5.7*  ALBUMIN 3.0*  AST 18  ALT 15  ALKPHOS 69  BILITOT 0.4   PT/INR No results for input(s): "LABPROT", "INR" in the last 72 hours.  Studies/Results: DG Chest Port 1 View  Result Date: 06/28/2023 CLINICAL DATA:  doe, tachycardia EXAM: PORTABLE CHEST - 1 VIEW COMPARISON:  05/22/2023 FINDINGS: Lungs are clear. Heart size upper limits normal. Aortic Atherosclerosis (ICD10-170.0). No effusion. Bilateral shoulder DJD. IMPRESSION: No acute findings. Electronically Signed   By: Corlis Leak M.D.   On: 06/28/2023 09:06    Impression:   Profound iron-deficiency anemia, recurrent.  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 Leonides Schanz.  Patient has had endoscopy with apc treatment to AVMs in both colon and duodenum.  Follow-up endoscopy two years ago no bleeding site. No overt GI bleeding, FOBT negative. Afib with RVR, troponin leak.  Plan:  Needs Hgb at least 7.5 for [place] to pursue endoscopic evaluation. Pending Hgb level and heart rate, we will tentatively plan on repeating endoscopy tomorrow. Risks (bleeding, infection, bowel perforation that could require surgery, sedation-related changes in cardiopulmonary systems), benefits (identification and  possible treatment of source of symptoms, exclusion of certain causes of symptoms), and alternatives (watchful waiting, radiographic imaging studies, empiric medical treatment) of upper endoscopy (  EGD) were explained to patient/family in detail and patient wishes to proceed.  Pending endoscopy findings, we can better assess safety/feasibility of anticoagulation. [facility] will follow.   LOS: 1 day   Arjay Jaskiewicz M  06/29/2023, 9:59 AM  Cell [phone] If no answer or after 5 PM call [phone]

## 2023-06-29 NOTE — Consult Note (Signed)
[facility] Consultation Note  Referring Provider: [facility] Primary Care Physician:  Lindaann Pascal, PA-C Primary Gastroenterologist:  Dr. Bosie Clos  Reason for Consultation:  Anemia  HPI: Jasmin Miller is a 82 y.o. female admitted weakness, shortness of breath.  Profound anemia.  FOBT negative, no melena, no hematemesis, no hematochezia.  No blood thinners.  New atrial fibrillation with RVR.  Prior colonoscopy 2021 and endoscopy x 2 (2021, 2022), all for anemia.  No abdominal pain, change in bowel habits, unintentional weight loss.  Saw Dr. Leonides Schanz couple years ago for IDA and consideration of iron infusion, doesn't seem like patient was compliant with this.   Past Medical History:  Diagnosis Date   DM2 (diabetes mellitus, type 2) (HCC)    Hypertension    MI (myocardial infarction) (HCC)    Symptomatic anemia    11/2018    Past Surgical History:  Procedure Laterality Date   ABDOMINAL HYSTERECTOMY     BIOPSY  07/12/2020   Procedure: BIOPSY;  Surgeon: Charlott Rakes, MD;  Location: [facility] ENDOSCOPY;  Service: Endoscopy;;   COLONOSCOPY  07/12/2020   COLONOSCOPY WITH PROPOFOL N/A 07/12/2020   Procedure: COLONOSCOPY WITH PROPOFOL;  Surgeon: Charlott Rakes, MD;  Location: [facility] ENDOSCOPY;  Service: Endoscopy;  Laterality: N/A;   ESOPHAGOGASTRODUODENOSCOPY  07/12/2020   ESOPHAGOGASTRODUODENOSCOPY N/A 07/12/2020   Procedure: ESOPHAGOGASTRODUODENOSCOPY (EGD);  Surgeon: Charlott Rakes, MD;  Location: [facility] ENDOSCOPY;  Service: Endoscopy;  Laterality: N/A;   ESOPHAGOGASTRODUODENOSCOPY N/A 04/19/2021   Procedure: ESOPHAGOGASTRODUODENOSCOPY (EGD);  Surgeon: Charlott Rakes, MD;  Location: [facility] ENDOSCOPY;  Service: Endoscopy;  Laterality: N/A;   HOT HEMOSTASIS N/A 07/12/2020   Procedure: HOT HEMOSTASIS (ARGON PLASMA COAGULATION/BICAP);  Surgeon: Charlott Rakes, MD;  Location: [facility] ENDOSCOPY;  Service: Endoscopy;  Laterality: N/A;  EGD and COLON    Prior to Admission medications    Medication Sig Start Date End Date Taking? Authorizing Provider  amLODipine (NORVASC) 5 MG tablet TAKE 1 TABLET(5 MG) BY MOUTH DAILY Patient taking differently: Take 5 mg by mouth every evening. 12/23/21  Yes Cantwell, Celeste C, PA-C  Ferrous Sulfate Dried (HIGH POTENCY IRON) 65 MG TABS Take 1 capsule by mouth daily.   Yes [provider]  furosemide (LASIX) 40 MG tablet Take 1 tablet (40 mg total) by mouth daily. Patient taking differently: Take 40 mg by mouth as needed for fluid or edema. 09/11/21  Yes Cantwell, Celeste C, PA-C  metFORMIN (GLUCOPHAGE-XR) 500 MG 24 hr tablet Take 500 mg by mouth every evening. 04/02/23  Yes [provider]  metoprolol succinate (TOPROL-XL) 100 MG 24 hr tablet Take 1 tablet (100 mg total) by mouth at bedtime. Take with or immediately following a meal. Patient taking differently: Take 100 mg by mouth daily. Take with or immediately following a meal. 08/23/21  Yes Cantwell, Celeste C, PA-C  pantoprazole (PROTONIX) 40 MG tablet Take 40 mg by mouth daily.   Yes [provider]  Potassium Chloride ER 20 MEQ TBCR Take 20 mEq by mouth daily. 08/23/21  Yes Cantwell, Celeste C, PA-C  rosuvastatin (CRESTOR) 10 MG tablet Take 1 tablet (10 mg total) by mouth daily. 08/23/21  Yes Cantwell, Celeste C, PA-C  triamcinolone cream (KENALOG) 0.1 % Apply 1 application topically every 8 (eight) hours as needed (itching).    Yes [provider]  valsartan (DIOVAN) 80 MG tablet Take 80 mg by mouth daily.   Yes [provider]    Current Facility-Administered Medications  Medication Dose Route Frequency Provider Last Rate Last Admin  0.9 %  sodium chloride infusion (Manually program via Guardrails IV Fluids)   Intravenous Once Cyndia Skeeters, DO   Held at 06/28/23 1128   0.9 %  sodium chloride infusion (Manually program via Guardrails IV Fluids)   Intravenous Once Levin Erp, MD       diltiazem (CARDIZEM) 1 mg/mL load via infusion 10 mg   10 mg Intravenous Once Spence, Sarah, DO       And   diltiazem (CARDIZEM) 125 mg in dextrose 5% 125 mL (1 mg/mL) infusion  5-15 mg/hr Intravenous Continuous Cyndia Skeeters, DO 5 mL/hr at 06/29/23 0342 5 mg/hr at 06/29/23 0342   insulin aspart (novoLOG) injection 0-9 Units  0-9 Units Subcutaneous TID WC Cyndia Skeeters, DO   2 Units at 06/28/23 1849   metoprolol succinate (TOPROL-XL) 24 hr tablet 100 mg  100 mg Oral Daily Tolia, Sunit, DO   100 mg at 06/29/23 1610   Oral care mouth rinse  15 mL Mouth Rinse PRN Billey Co, MD       pantoprazole (PROTONIX) EC tablet 40 mg  40 mg Oral Daily Cyndia Skeeters, DO   40 mg at 06/29/23 0802   polyethylene glycol (MIRALAX / GLYCOLAX) packet 17 g  17 g Oral Daily PRN Cyndia Skeeters, DO       potassium chloride (KLOR-CON) packet 40 mEq  40 mEq Oral Once Spence, Sarah, DO       rosuvastatin (CRESTOR) tablet 10 mg  10 mg Oral Daily Cyndia Skeeters, DO   10 mg at 06/29/23 9604   triamcinolone cream (KENALOG) 0.1 % cream 1 Application  1 Application Topical Q8H PRN Cyndia Skeeters, DO   1 Application at 06/28/23 2324    Allergies as of 06/28/2023   (No Known Allergies)    Family History  Problem Relation Age of Onset   Anemia Mother    Stroke Mother    Anemia Daughter     Social History   Socioeconomic History   Marital status: Widowed    Spouse name: Not on file   Number of children: 4   Years of education: Not on file   Highest education level: Not on file  Occupational History   Not on file  Tobacco Use   Smoking status: Former    Current packs/day: 0.00    Average packs/day: 1 pack/day for 29.0 years (29.0 ttl pk-yrs)    Types: Cigarettes    Start date: 61    Quit date: 77    Years since quitting: 31.5   Smokeless tobacco: Never   Tobacco comments:    SMOKED FOR 29 YEARS   Vaping Use   Vaping status: Never Used  Substance and Sexual Activity   Alcohol use: No   Drug use: No   Sexual activity: Not on file  Other Topics Concern    Not on file  Social History Narrative   Not on file   Social Determinants of Health   Financial Resource Strain: Not on file  Food Insecurity: No Food Insecurity (06/28/2023)   Hunger Vital Sign    Worried About Running Out of Food in the Last Year: Never true    Ran Out of Food in the Last Year: Never true  Transportation Needs: No Transportation Needs (06/28/2023)   PRAPARE - Administrator, Civil Service (Medical): No    Lack of Transportation (Non-Medical): No  Physical Activity: Not on file  Stress: Not on file  Social Connections: Not on file  Intimate Partner Violence: Patient Declined (06/28/2023)   Humiliation, Afraid, Rape, and Kick questionnaire    Fear of Current or Ex-Partner: Patient declined    Emotionally Abused: Patient declined    Physically Abused: Patient declined    Sexually Abused: Patient declined    Review of Systems: As per HPI, all others negative  Physical Exam: Vital signs in last 24 hours: Temp:  [97 F (36.1 C)-98.9 F (37.2 C)] 98.6 F (37 C) (07/22 0807) Pulse Rate:  [46-142] 108 (07/22 0807) Resp:  [16-23] 16 (07/22 0807) BP: (94-115)/(57-89) 115/89 (07/22 0807) SpO2:  [94 %-100 %] 97 % (07/22 0807) Weight:  [61.8 kg] 61.8 kg (07/21 1828) Last BM Date : 06/27/23 General:   Alert,  weak-appearing, deconditioned-appearing, otherwise cooperative in NAD Head:  Normocephalic and atraumatic. Eyes:  Sclera clear, no icterus.   Conjunctiva pale Ears:  Normal auditory acuity. Nose:  No deformity, discharge,  or lesions. Mouth:  No deformity or lesions.  Oropharynx pale and dry Neck:  Supple; no masses or thyromegaly. Lungs:  Dyspneic at rest CV:  Modestly tachycardic Abdomen:  Soft, nontender and nondistended. No masses, hepatosplenomegaly or hernias noted. Normal bowel sounds, without guarding, and without rebound.     Msk:  Symmetrical without gross deformities. Normal posture. Pulses:  Normal pulses noted. Extremities:  Without  clubbing or edema. Neurologic:  Alert and  oriented x4; diffusely weak otherwise grossly normal neurologically. Skin:  Intact without significant lesions or rashes. Psych:  Alert and cooperative. Normal mood and affect.   Lab Results: Recent Labs    06/28/23 0737 06/28/23 2103 06/29/23 0130 06/29/23 0616  WBC 5.9  --  10.0  --   HGB 4.8* 6.4* 6.6* 8.1*  HCT 17.8* 22.5* 23.2* 26.8*  PLT 244  --  229  --    BMET Recent Labs    06/28/23 0737 06/28/23 1703 06/29/23 0046  NA 137 140 137  K 2.9* 3.9 3.9  CL 104 102 106  CO2 21* 20* 20*  GLUCOSE 135* 182* 139*  BUN 18 18 21   CREATININE 1.25* 1.40* 1.59*  CALCIUM 8.0* 8.6* 8.5*   LFT Recent Labs    06/28/23 0737  PROT 5.7*  ALBUMIN 3.0*  AST 18  ALT 15  ALKPHOS 69  BILITOT 0.4   PT/INR No results for input(s): "LABPROT", "INR" in the last 72 hours.  Studies/Results: DG Chest Port 1 View  Result Date: 06/28/2023 CLINICAL DATA:  doe, tachycardia EXAM: PORTABLE CHEST - 1 VIEW COMPARISON:  05/22/2023 FINDINGS: Lungs are clear. Heart size upper limits normal. Aortic Atherosclerosis (ICD10-170.0). No effusion. Bilateral shoulder DJD. IMPRESSION: No acute findings. Electronically Signed   By: Corlis Leak M.D.   On: 06/28/2023 09:06    Impression:   Profound iron-deficiency anemia, recurrent.  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 Leonides Schanz.  Patient has had endoscopy with apc treatment to AVMs in both colon and duodenum.  Follow-up endoscopy two years ago no bleeding site. No overt GI bleeding, FOBT negative. Afib with RVR, troponin leak.  Plan:  Needs Hgb at least 7.5 for [place] to pursue endoscopic evaluation. Pending Hgb level and heart rate, we will tentatively plan on repeating endoscopy tomorrow. Risks (bleeding, infection, bowel perforation that could require surgery, sedation-related changes in cardiopulmonary systems), benefits (identification and  possible treatment of source of symptoms, exclusion of certain causes of symptoms), and alternatives (watchful waiting, radiographic imaging studies, empiric medical treatment) of upper endoscopy (  EGD) were explained to patient/family in detail and patient wishes to proceed.  Pending endoscopy findings, we can better assess safety/feasibility of anticoagulation. [facility] will follow.   LOS: 1 day   Shawonda Kerce M  06/29/2023, 9:59 AM  Cell [phone] If no answer or after 5 PM call [phone]

## 2023-06-29 NOTE — Progress Notes (Signed)
Received order to wean dilt gtt. Failed to wean dilt gtt with patient heart rates in the 120s-140s. Resumed at 5mg /hr. Notified Family Medicine Resident and Dr. Odis Hollingshead. Received verbal order from Dr. Odis Hollingshead to give 50mg  lopressor. Order belayed by [facility] Medicine team opting to hold lopressor and continue gtt until morning.

## 2023-06-29 NOTE — Hospital Course (Addendum)
Jasmin Miller is a 82 y.o.female with a history of DM, HTN, MI (1994), CHF, chronic anemia who was admitted to the [facility] Medicine Teaching Service at [facility] for new onset A-fib with RVR.   Her hospital course is detailed below:  Atrial fibrillation with RVR Patient diagnosed with new A Fib during this admission. Cardiology was consulted and started the patient on a diltiazem drip for rate control. Troponins were initially rising, stabilized and then were downtrending. TSH within normal limits.  Echo showed LVEF 40 to 45%.  Patient was ultimately weaned off of Cardizem drip and began metoprolol 100 mg daily. Tolerated this well. Per cardiology recs, patient was discharged on 180mg  diltiazem, 25 mg losartan, 100 mg metoprolol tartrate  BID, and continued on eliquis for now pending follow up with cardiology outpatient for a watchman device.   AKI On admission patient's creatinine was 1.25; this rose to 1.59 during the course of her admission and trended down during admission. Patient Cr was 1.05 on discharge. AKI resolved.  Anemia At time of presentation patient's hemoglobin was found to be 4.8. She received 2 units PRBCs and her hemoglobin rose to 8.1. Gastroenterology consulted as the patient had previously had endoscopy some years ago which revealed bleeding AVMs. Patient was taken for repeat endoscopy which revealed no active bleeding. GI okay'd patient to continue on anticoagulation. Patient received iron infusion on discharge and was contributing on her home iron supplement.   CHF Patient was given one-time dose of IV Lasix 20 mg. Patient discharged on lasix 40mg  daily, losartan 25 mg daily, spironolactone 12.5 mg, and jardiance 10mg .   DM Patient's metformin was held during hospitalization.  Sliding scale insulin was ordered and CBGs checked regularly.  No hypo- or hyperglycemic episodes. At time of discharge patient's home metformin dose of 500 mg daily was restarted.  Postinflammatory  hyperpigmentation Patient was very concerned about this issue during hospitalization. She stated that her rash, which was across her shoulders and upper back, was very itchy.  She used Kenalog cream as needed and this improved. Patient discharged on home Kenalog cream.   Other chronic conditions were medically managed with home medications and formulary alternatives as necessary: Hyperlipidemia: Continue home rosuvastatin 10 mg daily Chronic constipation: MiraLAX daily as needed GERD: Protonix 40 mg daily  PCP Follow-up Recommendations: Patient anemic during admission, consider frequent Hg checks d/t hx of AVMs and requirement of transfusion during this admission. Continue iron supplementation Patient on K sparing diuertics and K supplements, consider BMPs to trend K  Patient started on Eliquis for A fib despite anemia, assess fall risk  Strongly encourage close outpatient follow-up with cardiology, gastroenterology Consider referral to hematology for chronic anemia

## 2023-06-29 NOTE — Progress Notes (Signed)
Messaged by nursing about weaning diltiazem drip. Pt failed to wean when titrating down from 5. RN messaged Dr. Odis Hollingshead with cardiology who recommended lopressor 50 mg (order not placed by cardiology) but it was unclear from their conversation whether to continue diltiazem drip or discontinue drip with this order. Rates currently 120s to 130s on 5 of dilt. If not improved will titrate dilt drip up given plan with lopressor unclear. Plan for day team to communicate with cardiology about transition to orals tomorrow AM as tolerated. If vital sign changes occur overnight will contact on call cardiology provider.

## 2023-06-30 ENCOUNTER — Encounter (HOSPITAL_COMMUNITY)
Admission: EM | Disposition: A | Discharge status: Home / Self Care | Payer: Self-pay | Source: Home / Self Care | Attending: Family Medicine

## 2023-06-30 DIAGNOSIS — I5022 Chronic systolic (congestive) heart failure: Secondary | ICD-10-CM | POA: Insufficient documentation

## 2023-06-30 DIAGNOSIS — I493 Ventricular premature depolarization: Secondary | ICD-10-CM | POA: Insufficient documentation

## 2023-06-30 DIAGNOSIS — E119 Type 2 diabetes mellitus without complications: Secondary | ICD-10-CM | POA: Insufficient documentation

## 2023-06-30 DIAGNOSIS — I4891 Unspecified atrial fibrillation: Secondary | ICD-10-CM | POA: Diagnosis not present

## 2023-06-30 DIAGNOSIS — R7989 Other specified abnormal findings of blood chemistry: Secondary | ICD-10-CM | POA: Insufficient documentation

## 2023-06-30 LAB — GLUCOSE, CAPILLARY
Glucose-Capillary: 120 mg/dL — ABNORMAL HIGH (ref 70–99)
Glucose-Capillary: 211 mg/dL — ABNORMAL HIGH (ref 70–99)
Glucose-Capillary: 158 mg/dL — ABNORMAL HIGH (ref 70–99)
Glucose-Capillary: 157 mg/dL — ABNORMAL HIGH (ref 70–99)

## 2023-06-30 LAB — TYPE AND SCREEN
ABO/RH(D): B POS
Antibody Screen: POSITIVE
Donor AG Type: NEGATIVE
Donor AG Type: NEGATIVE
Unit division: 0
Unit division: 0

## 2023-06-30 LAB — BPAM RBC
Blood Product Expiration Date: 202408082359
ISSUE DATE / TIME: 202407220039

## 2023-06-30 LAB — BASIC METABOLIC PANEL
Anion gap: 11 (ref 5–15)
BUN: 21 mg/dL (ref 8–23)
CO2: 22 mmol/L (ref 22–32)
Calcium: 8.6 mg/dL — ABNORMAL LOW (ref 8.9–10.3)
Chloride: 105 mmol/L (ref 98–111)
Creatinine, Ser: 1.42 mg/dL — ABNORMAL HIGH (ref 0.44–1.00)
GFR, Estimated: 37 mL/min — ABNORMAL LOW (ref >60)
Glucose, Bld: 129 mg/dL — ABNORMAL HIGH (ref 70–99)
Potassium: 4.1 mmol/L (ref 3.5–5.1)
Sodium: 138 mmol/L (ref 135–145)

## 2023-06-30 LAB — CBC
HCT: 27.3 % — ABNORMAL LOW (ref 36.0–46.0)
Hemoglobin: 8.2 g/dL — ABNORMAL LOW (ref 12.0–15.0)
MCH: 20.9 pg — ABNORMAL LOW (ref 26.0–34.0)
MCHC: 30 g/dL (ref 30.0–36.0)
MCV: 69.6 fL — ABNORMAL LOW (ref 80.0–100.0)
Platelets: 192 10*3/uL (ref 150–400)
RBC: 3.92 MIL/uL (ref 3.87–5.11)
RDW: 24.3 % — ABNORMAL HIGH (ref 11.5–15.5)
WBC: 13.1 10*3/uL — ABNORMAL HIGH (ref 4.0–10.5)
nRBC: 0.3 % — ABNORMAL HIGH (ref 0.0–0.2)

## 2023-06-30 LAB — MAGNESIUM: Magnesium: 2.2 mg/dL (ref 1.7–2.4)

## 2023-06-30 SURGERY — ESOPHAGOGASTRODUODENOSCOPY (EGD) WITH PROPOFOL
Anesthesia: Monitor Anesthesia Care | Laterality: Left

## 2023-06-30 MED ORDER — FUROSEMIDE 10 MG/ML IJ SOLN
20.0000 mg | Freq: Every day | INTRAMUSCULAR | Status: DC
  Administered 2023-06-30 – 2023-07-02 (×3): 20 mg via INTRAVENOUS
  Filled 2023-06-30 (×5): qty 2

## 2023-06-30 MED ORDER — HYDROCORTISONE 1 % EX CREA
TOPICAL_CREAM | Freq: Three times a day (TID) | CUTANEOUS | Status: DC
  Administered 2023-06-30 – 2023-07-04 (×3): 1 via TOPICAL
  Filled 2023-06-30 (×2): qty 28

## 2023-06-30 MED ORDER — CYANOCOBALAMIN 1000 MCG/ML IJ SOLN
1000.0000 ug | Freq: Once | INTRAMUSCULAR | Status: AC
  Administered 2023-06-30: 1000 ug via INTRAMUSCULAR
  Filled 2023-06-30: qty 1

## 2023-06-30 MED ORDER — SODIUM CHLORIDE 0.9 % IV BOLUS
500.0000 mL | Freq: Once | INTRAVENOUS | Status: AC
  Administered 2023-06-30: 500 mL via INTRAVENOUS

## 2023-06-30 MED ORDER — METOPROLOL TARTRATE 100 MG PO TABS
100.0000 mg | ORAL_TABLET | Freq: Two times a day (BID) | ORAL | Status: DC
  Administered 2023-06-30 – 2023-07-07 (×15): 100 mg via ORAL
  Filled 2023-06-30 (×15): qty 1

## 2023-06-30 NOTE — TOC Initial Note (Addendum)
Transition of Care [facility]) - Initial/Assessment Note    Patient Details  Name: Jasmin Miller MRN: [id number] Date of Birth: 08-07-1941  Transition of Care [facility]) CM/SW Contact:    Jasmin Haven, RN Phone Number: 06/30/2023, 1:57 PM  Clinical Narrative:                 From home with her two grandson's one 82 yo and one 7 yo.  She states her PCP is [facility], she has Occupational psychologist.  She states she does not currently have any HH services in place at this time, Per pt/ot eval rec HHPT, HHOT,  NCM offered choice. Patient states she will call [facility] to see who they use for physical therapy, NCM informed her [facility] is for aide services.  She states to talk to her daughter , Jasmin Miller [phone].  NCM spoke with her she states she has no preference of the agency.  NCM made referral to [facility] with Frances Furbish, he is able to take referral . Soc will begin 24 to 48 hrs post dc. Patient states she does not want HH services and she does not want a tub shower seat, This NCM canceled the referral for HHPT and HHOT.   She has a cane that she uses at home. Patient states Jasmin Miller is her support system. Jasmin Miller will transport patient home at dc.  She gets her medications from [address] and [place].  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 Patient wanted to do her advance directives and make her daughter her HPOA.  The Staff RN paged the Chaplain to do this for patient.  Expected Discharge Plan: Home w Home Health Services Barriers to Discharge: Continued Medical Work up   Patient Goals and CMS Choice Patient states their goals for this hospitalization and ongoing recovery are:: return home   Choice offered to / list presented to : Patient, Adult Children      Expected Discharge Plan and Services In-house Referral: NA Discharge Planning Services: CM  Consult Post Acute Care Choice: Home Health Living arrangements for the past 2 months: Single Family Home                   DME Agency: NA       HH Arranged: PT, OT HH Agency: [facility] Date [facility] Agency Contacted: 06/30/23 Time HH Agency Contacted: [id number] Representative spoke with at [facility] Agency: Kandee Keen  Prior Living Arrangements/Services Living arrangements for the past 2 months: Single Family Home Lives with:: Adult Children (two grand children) Patient language and need for interpreter reviewed:: Yes Do you feel safe going back to the place where you live?: Yes      Need for Family Participation in Patient Care: Yes (Comment) Care giver support system in place?: Yes (comment) Current home services: DME (cane) Criminal Activity/Legal Involvement Pertinent to Current Situation/Hospitalization: No - Comment as needed  Activities of Daily Living Home Assistive Devices/Equipment: Cane (specify quad or straight), Eyeglasses, Dentures (specify type) ADL Screening (condition at time of admission) Patient's cognitive ability adequate to safely complete daily activities?: Yes Is the patient deaf or have difficulty hearing?: No Does the patient have difficulty seeing, even when wearing glasses/contacts?: No Does the patient have difficulty concentrating, remembering, or making decisions?: No Patient able to express need for assistance with ADLs?:  Yes Does the patient have difficulty dressing or bathing?: No Independently performs ADLs?: Yes (appropriate for developmental age) Does the patient have difficulty walking or climbing stairs?: Yes Weakness of Legs: None Weakness of Arms/Hands: None  Permission Sought/Granted Permission sought to share information with : Case Manager Permission granted to share information with : Yes, Verbal Permission Granted     Permission granted to share info w AGENCY: [facility]        Emotional Assessment Appearance:: Appears stated  age Attitude/Demeanor/Rapport: Engaged Affect (typically observed): Appropriate Orientation: : Oriented to Self, Oriented to Place, Oriented to  Time, Oriented to Situation Alcohol / Substance Use: Not Applicable Psych Involvement: No (comment)  Admission diagnosis:  Atrial fibrillation (HCC) [I48.91] Hypokalemia [E87.6] Atrial fibrillation with rapid ventricular response (HCC) [I48.91] Symptomatic anemia [D64.9] Patient Active Problem List   Diagnosis Date Noted   Chronic HFrEF (heart failure with reduced ejection fraction) (HCC) 06/30/2023   Non-insulin dependent type 2 diabetes mellitus (HCC) 06/30/2023   Elevated troponin level not due to acute coronary syndrome 06/30/2023   Premature ventricular contractions 06/30/2023   Severe anemia 06/29/2023   Atrial fibrillation with controlled ventricular response (HCC) 06/29/2023   Anemia 06/28/2023   Atrial fibrillation with RVR (HCC) 06/28/2023   Post-inflammatory hyperpigmentation 06/28/2023   CHF (congestive heart failure) (HCC) 06/28/2023   Constipation 06/28/2023   Hypokalemia 06/28/2023   PVC's (premature ventricular contractions) 06/28/2023   Elevated troponin level not due myocardial infarction 06/28/2023   Elevated brain natriuretic peptide (BNP) level 06/28/2023   Former smoker 06/28/2023   Diabetes mellitus type 2, noninsulin dependent (HCC) 06/28/2023   Benign hypertension 06/28/2023   Iron deficiency anemia due to chronic blood loss 10/17/2021   Hyperlipidemia associated with type 2 diabetes mellitus (HCC) 04/18/2021   AVM (arteriovenous malformation) of duodenum, 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   Acute gastritis without bleeding 07/12/2020   Acute blood loss anemia 07/11/2020   Microcytic anemia 07/10/2020   AKI (acute kidney injury) (HCC) 07/10/2020   Hyperlipidemia 02/14/2019   Murmur 02/14/2019   Exertional dyspnea 02/14/2019   Screening cholesterol level 02/14/2019    Macrocytic anemia 11/17/2018   Symptomatic anemia 11/17/2018   Unintentional weight loss 11/17/2018   Hypertension associated with diabetes (HCC) 11/17/2018   DM2 (diabetes mellitus, type 2) (HCC) 11/17/2018   PCP:  Lindaann Pascal, PA-C Pharmacy:   [id number] [place], [place] - [address] AT [facility] [address] [place] [address] Phone: [phone] Fax: [phone]     Social Determinants of Health (SDOH) Social History: SDOH Screenings   Food Insecurity: No Food Insecurity (06/28/2023)  Housing: Low Risk  (06/28/2023)  Transportation Needs: No Transportation Needs (06/28/2023)  Utilities: Not At Risk (06/28/2023)  Tobacco Use: Medium Risk (06/28/2023)   SDOH Interventions:     Readmission Risk Interventions     No data to display

## 2023-06-30 NOTE — Plan of Care (Signed)
  Problem: Education: Goal: Ability to describe self-care measures that may prevent or decrease complications (Diabetes Survival Skills Education) will improve Outcome: Progressing   Problem: Skin Integrity: Goal: Risk for impaired skin integrity will decrease Outcome: Progressing   Problem: Activity: Goal: Risk for activity intolerance will decrease Outcome: Progressing   Problem: Pain Managment: Goal: General experience of comfort will improve Outcome: Progressing   Problem: Skin Integrity: Goal: Risk for impaired skin integrity will decrease Outcome: Progressing

## 2023-06-30 NOTE — Progress Notes (Signed)
Heart Failure Navigator Progress Note  Assessed for Heart & Vascular TOC clinic readiness.  Patient does not meet criteria due to [facility] patient.   Navigator will sign off at this time.    Dawn Fields, BSN, RN Heart Failure Nurse Navigator Secure Chat Only   

## 2023-06-30 NOTE — Progress Notes (Signed)
Subjective: No abdominal pain. No melena or hematemesis or hematochezia.  Objective: Vital signs in last 24 hours: Temp:  [98 F (36.7 C)-98.2 F (36.8 C)] 98.2 F (36.8 C) (07/23 0819) Pulse Rate:  [94-133] 130 (07/23 0652) Resp:  [16-18] 18 (07/23 0819) BP: (105-136)/(55-90) 128/90 (07/23 0819) SpO2:  [92 %-99 %] 95 % (07/23 0819) Weight change:  Last BM Date : 06/27/23  PE: GEN:  Alert, NAD  Lab Results: CBC    Component Value Date/Time   WBC 13.1 (H) 06/30/2023 0125   RBC 3.92 06/30/2023 0125   HGB 8.2 (L) 06/30/2023 0125   HGB 9.0 (L) 10/17/2021 1347   HCT 27.3 (L) 06/30/2023 0125   PLT 192 06/30/2023 0125   PLT 258 10/17/2021 1347   MCV 69.6 (L) 06/30/2023 0125   MCH 20.9 (L) 06/30/2023 0125   MCHC 30.0 06/30/2023 0125   RDW 24.3 (H) 06/30/2023 0125   LYMPHSABS 1.0 06/28/2023 0737   MONOABS 0.7 06/28/2023 0737   EOSABS 0.2 06/28/2023 0737   BASOSABS 0.0 06/28/2023 0737  CMP     Component Value Date/Time   NA 138 06/30/2023 0125   K 4.1 06/30/2023 0125   CL 105 06/30/2023 0125   CO2 22 06/30/2023 0125   GLUCOSE 129 (H) 06/30/2023 0125   BUN 21 06/30/2023 0125   CREATININE 1.42 (H) 06/30/2023 0125   CREATININE 0.95 10/17/2021 1347   CALCIUM 8.6 (L) 06/30/2023 0125   PROT 5.7 (L) 06/28/2023 0737   ALBUMIN 3.0 (L) 06/28/2023 0737   AST 18 06/28/2023 0737   AST 20 10/17/2021 1347   ALT 15 06/28/2023 0737   ALT 14 10/17/2021 1347   ALKPHOS 69 06/28/2023 0737   BILITOT 0.4 06/28/2023 0737   BILITOT 0.3 10/17/2021 1347   GFRNONAA 37 (L) 06/30/2023 0125   GFRNONAA >60 10/17/2021 1347   Assessment:   Profound iron-deficiency anemia, recurrent.  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 Leonides Schanz.  Patient has had endoscopy with apc treatment to AVMs in both colon and duodenum.  Follow-up endoscopy two years ago no bleeding site. No overt GI bleeding, FOBT negative. Afib with RVR, troponin  leak  Plan:   HR 100-140s despite diltiazem drip. Patient's HR needs to be better optimized prior to pursuing non-emergent endoscopy. Endoscopy today is canceled; [facility] will revisit tomorrow to decide appropriateness of, and timing for, potential endoscopy.   Jasmin Miller 06/30/2023, 9:25 AM   Cell [phone] If no answer or after 5 PM call [phone]

## 2023-06-30 NOTE — Assessment & Plan Note (Signed)
New onset, stable. TSH WNL. Echo showed LVEF 40-45%. Troponins downtrending. - Cardiology (Dr. Odis Hollingshead) consulted, appreciate recs - per Cardiology, wean off Cardizem drip as tolerated - continue metoprolol tartrate 100mg  BID - awaiting anemia workup before considering anticoagulation - AM CBC/BMP  - Electrolyte goals of K> 4, Mg> 2; replete as necessary

## 2023-06-30 NOTE — Assessment & Plan Note (Signed)
Patient on metformin 500 mg daily at home.  A1c 6.5. CBGs appropriate, somewhat low PO intake due to lack of hunger and planned procedures. - Hold metformin during hospitalization - discontinue SSI - d/c scheduled CBGs

## 2023-06-30 NOTE — Assessment & Plan Note (Signed)
Creatinine 1.59.  Baseline somewhat unclear, appears to be somewhere around 1.  Complicated in the setting of anemia and A-fib, receiving IV fluids with other therapies at this time. - AM BMP - Avoid nephrotoxic drugs

## 2023-06-30 NOTE — Progress Notes (Signed)
   06/30/23 1304  Spiritual Encounters  Type of Visit Initial  Care provided to: Patient  Conversation partners present during encounter Nurse  Referral source Patient request  Reason for visit Advance directives  OnCall Visit No   Chaplain responded to call to visit Pt in regard to an Advance Directive.  There were actually 2 spiritual care consults submitted by this Pt indicating to me that she is in a hurry to get this paperwork completed.  As I visited the Pt I discovered that her desire did not line up with what we at [facility] can provide for her.  What Pt. Is really desiring is a Engineer, agricultural which would give her daughter authority to manage her home and property as well as disperse her money and pay bills.  I informed Pt that the HCPOA would not provide her all that she was looking for.  Initially she disagree with me claiming that she understood what a power of attorney is and what it does, but I was able to help clarify for her that the HCPOA is ONLY a POA over health care.  At this point, Pt declined the Advance Care Directive.  Chaplain said he would communicate with the RN about what Pt was really seeking and perhaps Social Work might be able to provide her some resources.    Chaplain asked Pt if there was anything else I could do to help her while I was present and she stated "just pray I can get better."  Chaplain offered to pray in that moment and Pt accepted my invitation.  Chaplain prayed and Pt was moved.  Chaplain explained to Pt how to reach out and seek Spiritual Care again in the future.

## 2023-06-30 NOTE — Assessment & Plan Note (Signed)
Patient on metformin 500 mg daily at home.  A1c 6.5. -Hold metformin during hospitalization -Sensitive SSI ordered - CBGs 4 times daily before meals and at bedtime

## 2023-06-30 NOTE — Assessment & Plan Note (Addendum)
This is a chronic problem. Patient received 2U PRBCs and Hgb is stable. Folate 16.5, B12 121, Ferritin 3, Iron 26, TIBC 533. - GI on board, planned for endoscopy this AM but this was cancelled. - consider outpatient follow up with GI for endoscopy  - Repeat CBC in AM, consider additional transfusion as needed - Consider IV iron infusion and potential hematology consult

## 2023-06-30 NOTE — Progress Notes (Addendum)
Daily Progress Note Intern Pager: [phone]  Patient name: Jasmin Miller Medical record number: [id number] Date of birth: Aug 19, 1941 Age: 81 y.o. Gender: female  Primary Care Provider: Lindaann Pascal, PA-C Consultants: Cardiology, GI Code Status: Full  Pt Overview and Major Events to Date:  7/21: Admitted to FMTS, received 2U PRBCs   Assessment and Plan: Jasmin Miller is a 82 y.o. female who presented with complaint of a rash, then found to be tachycardic and with new onset Afib with RVR.  Per patient request, I have made inquiry into assistance with HCPOA paperwork on patient's behalf.  [facility]     * (Principal) Atrial fibrillation with RVR (HCC)     New onset, stable. TSH WNL. Echo showed LVEF 40-45%. Troponins  downtrending. - Cardiology (Dr. Odis Hollingshead) consulted, appreciate recs - per Cardiology, wean off Cardizem drip as tolerated - continue metoprolol tartrate 100mg  BID - awaiting anemia workup before considering anticoagulation - AM CBC/BMP  - Electrolyte goals of K> 4, Mg> 2; replete as necessary        DM2 (diabetes mellitus, type 2) (HCC)     Patient on metformin 500 mg daily at home.  A1c 6.5. CBGs appropriate,  somewhat low PO intake due to lack of hunger and planned procedures. - Hold metformin during hospitalization - discontinue SSI - d/c scheduled CBGs        AKI (acute kidney injury) (HCC)     Creatinine 1.59.  Baseline somewhat unclear, appears to be somewhere  around 1.  Complicated in the setting of anemia and A-fib, receiving IV  fluids with other therapies at this time. - AM BMP - Avoid nephrotoxic drugs        Anemia     This is a chronic problem. Patient received 2U PRBCs and Hgb is stable.  Folate 16.5, B12 121, Ferritin 3, Iron 26, TIBC 533. - give 1 time B12 IM injection - GI on board, planned for endoscopy this AM but this was cancelled. - consider outpatient follow up with GI for endoscopy  - Repeat CBC in AM, consider  additional transfusion as needed - Consider IV iron infusion and potential hematology consult        Chronic HFrEF (heart failure with reduced ejection fraction) (HCC)     Non-insulin dependent type 2 diabetes mellitus (HCC)     Elevated troponin level not due to acute coronary syndrome     Premature ventricular contractions  Chronic, stable conditions: Hyperlipidemia: Continue home rosuvastatin 10 mg daily Chronic constipation: MiraLAX daily as needed GERD: Protonix 40 mg daily Postinflammatory hyperpigmentation: Continue Kenalog cream as needed CHF: 20mg  Lasix IV daily   FEN/GI: Regular diet PPx: SCDs Dispo:Home tomorrow. Barriers include clinical stability.   Subjective:  Patient seen this morning sitting up in bed comfortably.  She reports she would like help making her daughter Jasmin Miller her healthcare power of attorney.  She complains of hemorrhoids some bright red blood in her bowel movement yesterday and this morning.  She does have a history of hemorrhoids.  She reports itching to her back is continuing to improve.  She would like to repeat her endoscopy so she can go home.  She does have several bananas and unopened peanut butter at the bedside.  When asked if she had eaten anything last night/this morning, she denies.  Objective: Temp:  [98 F (36.7 C)-98.2 F (36.8 C)] 98.2 F (36.8 C) (07/23 0819) Pulse Rate:  [77-165] 77 (07/23  1100) Resp:  [16-18] 18 (07/23 0819) BP: (105-136)/(55-90) 128/90 (07/23 0819) SpO2:  [90 %-99 %] 96 % (07/23 1100) Physical Exam: General: Pleasant, no acute distress Cardiovascular: RRR Respiratory: CTA bilaterally.  On room air. Abdomen: Soft, nontender, nondistended Extremities: Moves all extremities equally Neuro: Alert and oriented Psych communicative, somewhat tangential  Laboratory: Most recent CBC Lab Results  Component Value Date   WBC 13.1 (H) 06/30/2023   HGB 8.2 (L) 06/30/2023   HCT 27.3 (L) 06/30/2023   MCV  69.6 (L) 06/30/2023   PLT 192 06/30/2023   Most recent BMP    Latest Ref Rng & Units 06/30/2023    1:25 AM  BMP  Glucose 70 - 99 mg/dL 865   BUN 8 - 23 mg/dL 21   Creatinine 7.84 - 1.00 mg/dL 6.96   Sodium 295 - 284 mmol/L 138   Potassium 3.5 - 5.1 mmol/L 4.1   Chloride 98 - 111 mmol/L 105   CO2 22 - 32 mmol/L 22   Calcium 8.9 - 10.3 mg/dL 8.6    Magnesium 2.2  Cyndia Skeeters, DO 06/30/2023, 12:33 PM  PGY-1, [facility] FPTS Intern pager: [phone], text pages welcome Secure chat group [facility] Teaching Service

## 2023-06-30 NOTE — Assessment & Plan Note (Signed)
New onset, stable. TSH WNL. Echo showed LVEF 40-45%. Troponins downtrending. - Cardiology (Dr. Odis Hollingshead) consulted, appreciate recs - per Cardiology, wean off Cardizem drip as tolerated - continue metoprolol 100mg  BID - awaiting anemia workup before considering anticoagulation - AM CBC/BMP  - Electrolyte goals of K> 4, Mg> 2; replete as necessary

## 2023-06-30 NOTE — Progress Notes (Signed)
Mobility Specialist Progress Note:   06/30/23 1003  Mobility  Activity Refused mobility   Pt refused mobility stating she is independent and "doesn't need to move w/ therapy" also pt was worried about her HR increasing. Will f/u as able.  Thompson Grayer Mobility Specialist  Please contact vis Secure Chat or  Rehab Office [phone]

## 2023-06-30 NOTE — Assessment & Plan Note (Signed)
This is a chronic problem. Patient received 2U PRBCs and Hgb is stable. - GI on board, plan for endoscopy this AM  - Repeat CBC in AM, consider additional transfusion as needed - Consider IV iron infusion and potential hematology consult

## 2023-06-30 NOTE — Progress Notes (Signed)
Progress Note  Patient Name: Jasmin Miller MRN: [id number] DOB: [birth year] Date of Encounter: 06/30/2023  Attending physician: Billey Co, MD Primary care provider: Lindaann Pascal, PA-C Primary Cardiologist: Dr. Truett Mainland  Subjective: Yaiza Palazzola is a 82 y.o. African-American female who was seen and examined at bedside  Resting in bed comfortably. Continues to be on Cardizem drip. Tentatively plan for endoscopy later today per nursing staff.  Objective: Vital Signs in the last 24 hours: Temp:  [98 F (36.7 C)-98.2 F (36.8 C)] 98.2 F (36.8 C) (07/23 0819) Pulse Rate:  [94-133] 130 (07/23 0652) Resp:  [16-18] 18 (07/23 0819) BP: (105-136)/(55-90) 128/90 (07/23 0819) SpO2:  [92 %-99 %] 95 % (07/23 0819)  Intake/Output:  Intake/Output Summary (Last 24 hours) at 06/30/2023 0935 Last data filed at 06/29/2023 1700 Gross per 24 hour  Intake 154.46 ml  Output 100 ml  Net 54.46 ml    Net IO Since Admission: 1,139.36 mL [06/30/23 0935]  Weights:     06/28/2023    6:28 PM 06/28/2023    7:27 AM 10/17/2021   12:59 PM  Last 3 Weights  Weight (lbs) 136 lb 3.9 oz 145 lb 138 lb 6.4 oz  Weight (kg) 61.8 kg 65.772 kg 62.778 kg      Telemetry:  Overnight telemetry shows AFib w/ RVR and controlled rate without significant ectopy, which I personally reviewed.   Physical examination: PHYSICAL EXAM: Vitals:   06/30/23 0105 06/30/23 0650 06/30/23 0652 06/30/23 0819  BP: (!) 105/55 135/85 136/82 (!) 128/90  Pulse: (!) 103 (!) 133 (!) 130   Resp: 16   18  Temp: 98.2 F (36.8 C)   98.2 F (36.8 C)  TempSrc: Oral   Oral  SpO2: 92% 95% 94% 95%  Weight:      Height:        Physical Exam  Constitutional: No distress.  Age appropriate, hemodynamically stable.   Neck: No JVD present.  Cardiovascular: Normal rate, S1 normal, S2 normal, intact distal pulses and normal pulses. An irregularly irregular rhythm present. Exam reveals no gallop, no S3 and no S4.  Murmur  heard. Systolic murmur is present with a grade of 3/6. Pulmonary/Chest: Effort normal and breath sounds normal. No stridor. She has no wheezes. She has no rales.  Abdominal: Soft. Bowel sounds are normal. She exhibits no distension. There is no abdominal tenderness.  Musculoskeletal:        General: No edema.     Cervical back: Neck supple.  Neurological: She is alert and oriented to person, place, and time. She has intact cranial nerves (2-12).  Skin: Skin is warm and moist.   Lab Results: Chemistry Recent Labs  Lab 06/28/23 0737 06/28/23 1703 06/29/23 0046 06/30/23 0125  NA 137 140 137 138  K 2.9* 3.9 3.9 4.1  CL 104 102 106 105  CO2 21* 20* 20* 22  GLUCOSE 135* 182* 139* 129*  BUN 18 18 21 21   CREATININE 1.25* 1.40* 1.59* 1.42*  CALCIUM 8.0* 8.6* 8.5* 8.6*  PROT 5.7*  --   --   --   ALBUMIN 3.0*  --   --   --   AST 18  --   --   --   ALT 15  --   --   --   ALKPHOS 69  --   --   --   BILITOT 0.4  --   --   --   GFRNONAA 43* 38* 32* 37*  ANIONGAP 12 18* 11  11    Hematology Recent Labs  Lab 06/28/23 0737 06/28/23 2103 06/29/23 0130 06/29/23 0616 06/29/23 1802 06/30/23 0125  WBC 5.9  --  10.0  --   --  13.1*  RBC 2.82*  --  3.47*  --   --  3.92  HGB 4.8*   < > 6.6* 8.1* 8.0* 8.2*  HCT 17.8*   < > 23.2* 26.8* 26.8* 27.3*  MCV 63.1*  --  66.9*  --   --  69.6*  MCH 17.0*  --  19.0*  --   --  20.9*  MCHC 27.0*  --  28.4*  --   --  30.0  RDW 21.2*  --  23.3*  --   --  24.3*  PLT 244  --  229  --   --  192   < > = values in this interval not displayed.   High Sensitivity Troponin:   Recent Labs  Lab 06/28/23 0737 06/28/23 0929 06/28/23 1703 06/28/23 2102 06/29/23 1206  TROPONINIHS 198* 212* 224* 235* 156*     Cardiac EnzymesNo results for input(s): "TROPONINI" in the last 168 hours. No results for input(s): "TROPIPOC" in the last 168 hours.  BNP Recent Labs  Lab 06/28/23 0737  BNP 731.2*    DDimer No results for input(s): "DDIMER" in the last 168  hours.  Hemoglobin A1c:  Lab Results  Component Value Date   HGBA1C 6.5 (H) 06/28/2023   MPG 139.85 06/28/2023   TSH  Recent Labs    06/28/23 1839  TSH 3.235   Lipid Panel  Lab Results  Component Value Date   CHOL 97 06/29/2023   HDL 45 06/29/2023   LDLCALC 42 06/29/2023   LDLDIRECT 39 06/29/2023   TRIG 51 06/29/2023   CHOLHDL 2.2 06/29/2023   Drugs of Abuse     Component Value Date/Time   LABOPIA NONE DETECTED 10/03/2015 1313   COCAINSCRNUR NONE DETECTED 10/03/2015 1313   LABBENZ NONE DETECTED 10/03/2015 1313   AMPHETMU NONE DETECTED 10/03/2015 1313   THCU NONE DETECTED 10/03/2015 1313   LABBARB NONE DETECTED 10/03/2015 1313      Imaging: ECHOCARDIOGRAM COMPLETE  Result Date: 06/29/2023    ECHOCARDIOGRAM REPORT   Patient Name:   LEATRICE PARILLA Date of Exam: 06/29/2023 Medical Rec #:  [id number]      Height:       61.0 in Accession #:    [id number]     Weight:       136.2 lb Date of Birth:  [birth year]      BSA:          1.604 m Patient Age:    82 years       BP:           94/61 mmHg Patient Gender: F              HR:           100 bpm. Exam Location:  Inpatient Procedure: 2D Echo, Color Doppler and Cardiac Doppler Indications:    I48.91* Unspecified atrial fibrillation  History:        Patient has prior history of Echocardiogram examinations, most                 recent 04/19/2021. CHF; Risk Factors:Hypertension, Diabetes and                 Dyslipidemia.  Sonographer:    Irving Burton Senior RDCS Referring Phys: [id number] MARGARET E PRAY IMPRESSIONS  1. Left  ventricular ejection fraction, by estimation, is 40 to 45%. The left ventricle has mildly decreased function. The left ventricle demonstrates global hypokinesis. The left ventricular internal cavity size was mildly dilated. There is mild left ventricular hypertrophy. Left ventricular diastolic parameters are indeterminate.  2. Right ventricular systolic function is mildly reduced. The right ventricular size is normal. There is mildly  elevated pulmonary artery systolic pressure. The estimated right ventricular systolic pressure is 42.9 mmHg.  3. Left atrial size was severely dilated.  4. The mitral valve is abnormal. Severe mitral valve regurgitation.  5. Tricuspid valve regurgitation is mild to moderate.  6. The aortic valve is tricuspid. Aortic valve regurgitation is not visualized. Aortic valve sclerosis/calcification is present, without any evidence of aortic stenosis.  7. The inferior vena cava is dilated in size with <50% respiratory variability, suggesting right atrial pressure of 15 mmHg. FINDINGS  Left Ventricle: Left ventricular ejection fraction, by estimation, is 40 to 45%. The left ventricle has mildly decreased function. The left ventricle demonstrates global hypokinesis. The left ventricular internal cavity size was mildly dilated. There is  mild left ventricular hypertrophy. Left ventricular diastolic parameters are indeterminate. Right Ventricle: The right ventricular size is normal. Right vetricular wall thickness was not well visualized. Right ventricular systolic function is mildly reduced. There is mildly elevated pulmonary artery systolic pressure. The tricuspid regurgitant velocity is 2.64 m/s, and with an assumed right atrial pressure of 15 mmHg, the estimated right ventricular systolic pressure is 42.9 mmHg. Left Atrium: Left atrial size was severely dilated. Right Atrium: Right atrial size was normal in size. Pericardium: There is no evidence of pericardial effusion. Mitral Valve: The mitral valve is abnormal. Severe mitral valve regurgitation. Tricuspid Valve: The tricuspid valve is normal in structure. Tricuspid valve regurgitation is mild to moderate. Aortic Valve: The aortic valve is tricuspid. Aortic valve regurgitation is not visualized. Aortic valve sclerosis/calcification is present, without any evidence of aortic stenosis. Pulmonic Valve: The pulmonic valve was not well visualized. Pulmonic valve regurgitation  is trivial. Aorta: The aortic root and ascending aorta are structurally normal, with no evidence of dilitation. Venous: The inferior vena cava is dilated in size with less than 50% respiratory variability, suggesting right atrial pressure of 15 mmHg. IAS/Shunts: The interatrial septum was not well visualized.  LEFT VENTRICLE PLAX 2D LVIDd:         5.50 cm LVIDs:         4.60 cm LV PW:         0.80 cm LV IVS:        1.00 cm LVOT diam:     1.80 cm LV SV:         29 LV SV Index:   18 LVOT Area:     2.54 cm  LV Volumes (MOD) LV vol d, MOD A2C: 138.0 ml LV vol d, MOD A4C: 95.3 ml LV vol s, MOD A2C: 84.3 ml LV vol s, MOD A4C: 50.9 ml LV SV MOD A2C:     53.7 ml LV SV MOD A4C:     95.3 ml LV SV MOD BP:      48.1 ml RIGHT VENTRICLE RV S prime:     8.27 cm/s TAPSE (M-mode): 1.0 cm LEFT ATRIUM              Index        RIGHT ATRIUM           Index LA diam:        4.60 cm  2.87 cm/m   RA Area:     15.70 cm LA Vol (A2C):   96.6 ml  60.21 ml/m  RA Volume:   31.40 ml  19.57 ml/m LA Vol (A4C):   119.0 ml 74.17 ml/m LA Biplane Vol: 107.0 ml 66.69 ml/m  AORTIC VALVE LVOT Vmax:   83.23 cm/s LVOT Vmean:  60.267 cm/s LVOT VTI:    0.113 m  AORTA Ao Root diam: 2.60 cm Ao Asc diam:  3.20 cm MR Peak grad:    88.9 mmHg    TRICUSPID VALVE MR Mean grad:    54.0 mmHg    TR Peak grad:   27.9 mmHg MR Vmax:         471.50 cm/s  TR Vmax:        264.00 cm/s MR Vmean:        339.0 cm/s MR PISA:         4.54 cm     SHUNTS MR PISA Eff ROA: 38 mm       Systemic VTI:  0.11 m MR PISA Radius:  0.85 cm      Systemic Diam: 1.80 cm Epifanio Lesches MD Electronically signed by Epifanio Lesches MD Signature Date/Time: 06/29/2023/11:55:46 AM    Final     CARDIAC DATABASE: EKG: June 28, 2023: Atrial fibrillation, 127 bpm, poor R wave progression, without underlying injury pattern, frequent PVCs.   Echocardiogram:  04/19/2021:   1. Left ventricular ejection fraction, by estimation, is 50%. The left ventricle has mildly decreased function.  The left ventricle demonstrates regional wall motion abnormalities with severe hypokinesis of the basal inferior and basal inferolateral  walls, hypokinesis of the basal anterolateral wall. Left ventricular diastolic parameters are consistent with Grade II diastolic dysfunction (pseudonormalization).  2. Right ventricular systolic function is mildly reduced. The right ventricular size is normal. There is normal pulmonary artery systolic pressure. The estimated right ventricular systolic pressure is 29.3 mmHg.  3. Left atrial size was severely dilated.  4. Right atrial size was mildly dilated.  5. The mitral valve is abnormal. At least moderate to possibly severe mitral valve regurgitation. This may not be fully visualized. Possible infarct-related mitral regurgitation given regional wall motion abnormalities. No evidence of mitral stenosis.  6. The aortic valve is tricuspid. Aortic valve regurgitation is not visualized. Mild aortic valve sclerosis is present, with no evidence of aortic valve stenosis.  7. The inferior vena cava is dilated in size with <50% respiratory variability, suggesting right atrial pressure of 15 mmHg.  Scheduled Meds:  furosemide  20 mg Intravenous Daily   insulin aspart  0-9 Units Subcutaneous TID WC   metoprolol tartrate  100 mg Oral BID   pantoprazole  40 mg Oral Daily   rosuvastatin  10 mg Oral Daily    Continuous Infusions:  diltiazem (CARDIZEM) infusion 7.5 mg/hr (06/30/23 0640)    PRN Meds: mouth rinse, polyethylene glycol, triamcinolone cream   IMPRESSION & RECOMMENDATIONS: Jeffery Gammell is a 82 y.o. 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   Impression: Atrial fibrillation, newly discovered Symptomatic anemia status post blood transfusion. Chronic HFrEF Premature ventricular  contractions. History of myocardial infarction. Hypertension. Elevated high-sensitivity troponins. Elevated BNP. Non-insulin-dependent diabetes mellitus type 2. Former smoker  Recommendations: Newly discovered atrial fibrillation: On presentation Over the last 24 hours has had episodes of RVR and controlled ventricular rate. Not a candidate for  IV amiodarone in the setting of reduced LVEF she is not on anticoagulation given her severe symptomatic anemia Chronicity of A-fib is unknown.  However based on prior echocardiogram results left atrium size is severely dilated. Wean off Cardizem drip as tolerated. Discontinue Toprol-XL. Start Lopressor 100 mg p.o. twice daily with holding parameters. Rate control: Metoprolol. Rhythm control: N/A. Thromboembolic prophylaxis none in the setting of severe symptomatic anemia with a hemoglobin of 4.8 g/dL on arrival.  Will await GI clearance.  Scheduled for endoscopy later today. CHA2DS2-VASc SCORE is 5 which correlates to 7.2% risk of stroke per year (HTN, diabetes, age, gender).  TSH within normal limits. Will focus on rate control strategy until anemia workup is complete and she is cleared to be on anticoagulation.  Symptomatic anemia: Hemoglobin on arrival 4.8 g/dL Status post blood transfusions. Hemoglobin relatively stable GI following-appreciate recommendations.  Will await their clearance prior to trial of anticoagulation. History of AVM Would be a great candidate for left atrial appendage occlusion device, i.e. Watchman -evaluation as outpatient  Premature ventricular contractions: Improving. Continue telemetry  Chronic HFpEF History of myocardial infarction. Not in overt heart failure. Troponin leak likely secondary to supply demand ischemia in the setting of severe symptomatic anemia and A-fib with RVR. Denies anginal chest pain. Echocardiogram results reviewed. Will uptitrate GDMT after anemia workup is complete. Started on  low-dose Lasix. Check BNP in the morning. Ischemic workup as outpatient once anemia is corrected.  Sooner if change in clinical status.  Patient's questions and concerns were addressed to her satisfaction. She voices understanding of the instructions provided during this encounter.   This note was created using a voice recognition software as a result there may be grammatical errors inadvertently enclosed that do not reflect the nature of this encounter. Every attempt is made to correct such errors.  Delilah Shan [facility]  Pager:  [phone] Office: [phone] 06/30/2023, 9:35 AM

## 2023-07-01 DIAGNOSIS — I4891 Unspecified atrial fibrillation: Secondary | ICD-10-CM | POA: Diagnosis not present

## 2023-07-01 LAB — BPAM RBC
Blood Product Expiration Date: 202408252359
ISSUE DATE / TIME: 202407201620
ISSUE DATE / TIME: 202407241145

## 2023-07-01 LAB — BASIC METABOLIC PANEL
Anion gap: 9 (ref 5–15)
BUN: 25 mg/dL — ABNORMAL HIGH (ref 8–23)
CO2: 21 mmol/L — ABNORMAL LOW (ref 22–32)
Calcium: 8.1 mg/dL — ABNORMAL LOW (ref 8.9–10.3)
Chloride: 107 mmol/L (ref 98–111)
Creatinine, Ser: 1.55 mg/dL — ABNORMAL HIGH (ref 0.44–1.00)
GFR, Estimated: 33 mL/min — ABNORMAL LOW (ref >60)
Glucose, Bld: 138 mg/dL — ABNORMAL HIGH (ref 70–99)
Potassium: 3.6 mmol/L (ref 3.5–5.1)
Sodium: 137 mmol/L (ref 135–145)

## 2023-07-01 LAB — PREPARE RBC (CROSSMATCH)

## 2023-07-01 LAB — TYPE AND SCREEN
ABO/RH(D): B POS
Donor AG Type: NEGATIVE
Unit division: 0
Unit division: 0

## 2023-07-01 LAB — CBC
HCT: 25.6 % — ABNORMAL LOW (ref 36.0–46.0)
Hemoglobin: 7.6 g/dL — ABNORMAL LOW (ref 12.0–15.0)
MCH: 20.5 pg — ABNORMAL LOW (ref 26.0–34.0)
MCHC: 29.7 g/dL — ABNORMAL LOW (ref 30.0–36.0)
MCV: 69.2 fL — ABNORMAL LOW (ref 80.0–100.0)
Platelets: 171 10*3/uL (ref 150–400)
RBC: 3.7 MIL/uL — ABNORMAL LOW (ref 3.87–5.11)
RDW: 25.6 % — ABNORMAL HIGH (ref 11.5–15.5)
WBC: 11.5 10*3/uL — ABNORMAL HIGH (ref 4.0–10.5)
nRBC: 0.3 % — ABNORMAL HIGH (ref 0.0–0.2)

## 2023-07-01 LAB — HEMOGLOBIN AND HEMATOCRIT, BLOOD
HCT: 30.6 % — ABNORMAL LOW (ref 36.0–46.0)
Hemoglobin: 9.2 g/dL — ABNORMAL LOW (ref 12.0–15.0)

## 2023-07-01 LAB — MAGNESIUM: Magnesium: 2.1 mg/dL (ref 1.7–2.4)

## 2023-07-01 MED ORDER — POTASSIUM CHLORIDE 10 MEQ/100ML IV SOLN
10.0000 meq | INTRAVENOUS | Status: AC
  Administered 2023-07-01 (×2): 10 meq via INTRAVENOUS
  Filled 2023-07-01 (×4): qty 100

## 2023-07-01 MED ORDER — SODIUM CHLORIDE 0.9% IV SOLUTION: Freq: Once | INTRAVENOUS | Status: AC

## 2023-07-01 MED ORDER — POTASSIUM CHLORIDE 10 MEQ/100ML IV SOLN
10.0000 meq | INTRAVENOUS | Status: AC
  Administered 2023-07-01 (×2): 10 meq via INTRAVENOUS

## 2023-07-01 NOTE — Progress Notes (Signed)
I visited Jasmin Miller early this afternoon to discuss current plans.  She is understandably frustrated that for 2 days endoscopies have been planned and then canceled.  Now her endoscopy is scheduled for this Friday 7/26.  She stated she was planning to leave [facility] tomorrow.  We had a long discussion about this.  I explained to her that it was important to make sure that she would be safe during an endoscopy procedure, and part of that decision was made based on her heart rate and the medications that she was taking for her atrial fibrillation to try to control her heart rate.  We discussed that ultimately it is very important that she has this endoscopy to rule out any bleeding has she does need anticoagulation due to her atrial fibrillation.  She stated that she does understand, and after giving it some thought she stated she would stay and wait for endoscopy on Friday.  I encouraged her to ask any questions that she had and answered them to the best of my ability.  I encouraged her to let her nurse know if she had any further questions, and that we would answer those promptly. I also reminded her that a diet had been ordered, she no longer had to remain NPO, and I encouraged her to eat and drink what ever she would like.  Cyndia Skeeters, DO PGY-1, [facility]

## 2023-07-01 NOTE — Progress Notes (Addendum)
PT Cancellation Note  Patient Details Name: Seniyah Esker MRN: [id number] DOB: 1940-12-18   Cancelled Treatment:    Reason Eval/Treat Not Completed: (P) Patient declined, no reason specified (Pt reports, " I can move just fine.  I'm mad and I would just like to eat my breakfast."  Pt continues to decline despite education and encouragement.)   Rebeccah Ivins Artis Delay 07/01/2023, 1:15 PM  Bonney Leitz , PTA Acute Rehabilitation Services Office [phone]

## 2023-07-01 NOTE — Plan of Care (Signed)
Problem: Education: Goal: Ability to describe self-care measures that may prevent or decrease complications (Diabetes Survival Skills Education) will improve Outcome: Progressing Goal: Individualized Educational Video(s) Outcome: Progressing   Problem: Coping: Goal: Ability to adjust to condition or change in health will improve Outcome: Progressing   Problem: Fluid Volume: Goal: Ability to maintain a balanced intake and output will improve Outcome: Progressing   Problem: Health Behavior/Discharge Planning: Goal: Ability to identify and utilize available resources and services will improve Outcome: Progressing Goal: Ability to manage health-related needs will improve Outcome: Progressing   Problem: Metabolic: Goal: Ability to maintain appropriate glucose levels will improve Outcome: Progressing   Problem: Nutritional: Goal: Maintenance of adequate nutrition will improve Outcome: Progressing Goal: Progress toward achieving an optimal weight will improve Outcome: Progressing   Problem: Skin Integrity: Goal: Risk for impaired skin integrity will decrease Outcome: Progressing   Problem: Tissue Perfusion: Goal: Adequacy of tissue perfusion will improve Outcome: Progressing   Problem: Education: Goal: Knowledge of General Education information will improve Description: Including pain rating scale, medication(s)/side effects and non-pharmacologic comfort measures Outcome: Progressing   Problem: Health Behavior/Discharge Planning: Goal: Ability to manage health-related needs will improve Outcome: Progressing   Problem: Clinical Measurements: Goal: Ability to maintain clinical measurements within normal limits will improve Outcome: Progressing Goal: Will remain free from infection Outcome: Progressing Goal: Diagnostic test results will improve Outcome: Progressing Goal: Respiratory complications will improve Outcome: Progressing Goal: Cardiovascular complication will  be avoided Outcome: Progressing   Problem: Activity: Goal: Risk for activity intolerance will decrease Outcome: Progressing   Problem: Nutrition: Goal: Adequate nutrition will be maintained Outcome: Progressing   Problem: Coping: Goal: Level of anxiety will decrease Outcome: Progressing   Problem: Elimination: Goal: Will not experience complications related to bowel motility Outcome: Progressing Goal: Will not experience complications related to urinary retention Outcome: Progressing   Problem: Pain Managment: Goal: General experience of comfort will improve Outcome: Progressing   Problem: Safety: Goal: Ability to remain free from injury will improve Outcome: Progressing   Problem: Skin Integrity: Goal: Risk for impaired skin integrity will decrease Outcome: Progressing   Problem: Education: Goal: Knowledge of disease or condition will improve Outcome: Progressing Goal: Understanding of medication regimen will improve Outcome: Progressing Goal: Individualized Educational Video(s) Outcome: Progressing   Problem: Activity: Goal: Ability to tolerate increased activity will improve Outcome: Progressing   Problem: Education: Goal: Knowledge of General Education information will improve Description: Including pain rating scale, medication(s)/side effects and non-pharmacologic comfort measures Outcome: Progressing   Problem: Health Behavior/Discharge Planning: Goal: Ability to manage health-related needs will improve Outcome: Progressing   Problem: Clinical Measurements: Goal: Ability to maintain clinical measurements within normal limits will improve Outcome: Progressing Goal: Will remain free from infection Outcome: Progressing Goal: Diagnostic test results will improve Outcome: Progressing Goal: Respiratory complications will improve Outcome: Progressing Goal: Cardiovascular complication will be avoided Outcome: Progressing   Problem: Activity: Goal:  Risk for activity intolerance will decrease Outcome: Progressing   Problem: Nutrition: Goal: Adequate nutrition will be maintained Outcome: Progressing   Problem: Coping: Goal: Level of anxiety will decrease Outcome: Progressing   Problem: Elimination: Goal: Will not experience complications related to bowel motility Outcome: Progressing Goal: Will not experience complications related to urinary retention Outcome: Progressing   Problem: Pain Managment: Goal: General experience of comfort will improve Outcome: Progressing   Problem: Safety: Goal: Ability to remain free from injury will improve Outcome: Progressing   Problem: Skin Integrity: Goal: Risk for impaired skin integrity will decrease Outcome:  Progressing

## 2023-07-01 NOTE — Plan of Care (Signed)
HR fluctuating 90s-100s afib with PVCs. Pt denies feeling any dizziness or shortness of breath. Pt irritable and expresses frustration with RN or NT getting vitals or doing care each time staff in room. Plan of care explained to pt and nursing care bundled and trying to limit interruptions in sleep.   Problem: Fluid Volume: Goal: Ability to maintain a balanced intake and output will improve Outcome: Progressing   Problem: Nutritional: Goal: Maintenance of adequate nutrition will improve Outcome: Progressing Goal: Progress toward achieving an optimal weight will improve Outcome: Progressing   Problem: Metabolic: Goal: Ability to maintain appropriate glucose levels will improve Outcome: Progressing   Problem: Clinical Measurements: Goal: Ability to maintain clinical measurements within normal limits will improve Outcome: Progressing Goal: Will remain free from infection Outcome: Progressing Goal: Diagnostic test results will improve Outcome: Progressing Goal: Respiratory complications will improve Outcome: Progressing Goal: Cardiovascular complication will be avoided Outcome: Progressing

## 2023-07-01 NOTE — Progress Notes (Signed)
Progress Note  Patient Name: Jasmin Miller MRN: [id number] DOB: 09-03-1941 Date of Encounter: 07/01/2023  Attending physician: Billey Co, MD Primary care provider: Lindaann Pascal, PA-C Primary Cardiologist: Dr. Truett Mainland  Subjective: Jasmin Miller is a 82 y.o. African-American female who was seen and examined at bedside  Ventricular rate has improved.  Daughter at bedside. Denies any chest pain or shortness of breath.  Objective: Vital Signs in the last 24 hours: Temp:  [97.9 F (36.6 C)-99.1 F (37.3 C)] 97.9 F (36.6 C) (07/24 1305) Pulse Rate:  [87-124] 105 (07/24 1305) Resp:  [16-17] 16 (07/24 1305) BP: (95-119)/(56-77) 95/65 (07/24 1305) SpO2:  [92 %-96 %] 96 % (07/24 1305) Weight:  [62.5 kg] 62.5 kg (07/24 0534)  Intake/Output:  Intake/Output Summary (Last 24 hours) at 07/01/2023 1405 Last data filed at 07/01/2023 0537 Gross per 24 hour  Intake 100 ml  Output 100 ml  Net 0 ml    Net IO Since Admission: 1,139.36 mL [07/01/23 1405]  Weights:     07/01/2023    5:34 AM 06/28/2023    6:28 PM 06/28/2023    7:27 AM  Last 3 Weights  Weight (lbs) 137 lb 11.2 oz 136 lb 3.9 oz 145 lb  Weight (kg) 62.46 kg 61.8 kg 65.772 kg      Telemetry:  Overnight telemetry shows AFib with predominantly controlled rate without significant ectopy, which I personally reviewed.   Physical examination: PHYSICAL EXAM: Vitals:   07/01/23 1135 07/01/23 1152 07/01/23 1208 07/01/23 1305  BP: 96/67 100/68 98/70 95/65   Pulse: (!) 120 (!) 102 (!) 115 (!) 105  Resp: 17 16 16 16   Temp: 99.1 F (37.3 C) 98.4 F (36.9 C) 98.4 F (36.9 C) 97.9 F (36.6 C)  TempSrc: Oral Oral Oral Oral  SpO2: 96% 93% 96% 96%  Weight:      Height:        Physical Exam  Constitutional: No distress.  Age appropriate, hemodynamically stable.   Neck: No JVD present.  Cardiovascular: Normal rate, S1 normal, S2 normal, intact distal pulses and normal pulses. An irregularly irregular rhythm  present. Exam reveals no gallop, no S3 and no S4.  Murmur heard. Systolic murmur is present with a grade of 3/6. Pulmonary/Chest: Effort normal and breath sounds normal. No stridor. She has no wheezes. She has no rales.  Abdominal: Soft. Bowel sounds are normal. She exhibits no distension. There is no abdominal tenderness.  Musculoskeletal:        General: No edema.     Cervical back: Neck supple.  Neurological: She is alert and oriented to person, place, and time. She has intact cranial nerves (2-12).  Skin: Skin is warm and moist.   Lab Results: Chemistry Recent Labs  Lab 06/28/23 0737 06/28/23 1703 06/29/23 0046 06/30/23 0125 06/30/23 2341  NA 137   < > 137 138 137  K 2.9*   < > 3.9 4.1 3.6  CL 104   < > 106 105 107  CO2 21*   < > 20* 22 21*  GLUCOSE 135*   < > 139* 129* 138*  BUN 18   < > 21 21 25*  CREATININE 1.25*   < > 1.59* 1.42* 1.55*  CALCIUM 8.0*   < > 8.5* 8.6* 8.1*  PROT 5.7*  --   --   --   --   ALBUMIN 3.0*  --   --   --   --   AST 18  --   --   --   --  ALT 15  --   --   --   --   ALKPHOS 69  --   --   --   --   BILITOT 0.4  --   --   --   --   GFRNONAA 43*   < > 32* 37* 33*  ANIONGAP 12   < > 11 11 9    < > = values in this interval not displayed.    Hematology Recent Labs  Lab 06/29/23 0130 06/29/23 0616 06/29/23 1802 06/30/23 0125 06/30/23 2341  WBC 10.0  --   --  13.1* 11.5*  RBC 3.47*  --   --  3.92 3.70*  HGB 6.6*   < > 8.0* 8.2* 7.6*  HCT 23.2*   < > 26.8* 27.3* 25.6*  MCV 66.9*  --   --  69.6* 69.2*  MCH 19.0*  --   --  20.9* 20.5*  MCHC 28.4*  --   --  30.0 29.7*  RDW 23.3*  --   --  24.3* 25.6*  PLT 229  --   --  192 171   < > = values in this interval not displayed.   High Sensitivity Troponin:   Recent Labs  Lab 06/28/23 0737 06/28/23 0929 06/28/23 1703 06/28/23 2102 06/29/23 1206  TROPONINIHS 198* 212* 224* 235* 156*     Cardiac EnzymesNo results for input(s): "TROPONINI" in the last 168 hours. No results for input(s):  "TROPIPOC" in the last 168 hours.  BNP Recent Labs  Lab 06/28/23 0737  BNP 731.2*    DDimer No results for input(s): "DDIMER" in the last 168 hours.  Hemoglobin A1c:  Lab Results  Component Value Date   HGBA1C 6.5 (H) 06/28/2023   MPG 139.85 06/28/2023   TSH  Recent Labs    06/28/23 1839  TSH 3.235   Lipid Panel  Lab Results  Component Value Date   CHOL 97 06/29/2023   HDL 45 06/29/2023   LDLCALC 42 06/29/2023   LDLDIRECT 39 06/29/2023   TRIG 51 06/29/2023   CHOLHDL 2.2 06/29/2023   Drugs of Abuse     Component Value Date/Time   LABOPIA NONE DETECTED 10/03/2015 1313   COCAINSCRNUR NONE DETECTED 10/03/2015 1313   LABBENZ NONE DETECTED 10/03/2015 1313   AMPHETMU NONE DETECTED 10/03/2015 1313   THCU NONE DETECTED 10/03/2015 1313   LABBARB NONE DETECTED 10/03/2015 1313      Imaging: No results found.  CARDIAC DATABASE: EKG: June 28, 2023: Atrial fibrillation, 127 bpm, poor R wave progression, without underlying injury pattern, frequent PVCs.   Echocardiogram:  04/19/2021:   1. Left ventricular ejection fraction, by estimation, is 50%. The left ventricle has mildly decreased function. The left ventricle demonstrates regional wall motion abnormalities with severe hypokinesis of the basal inferior and basal inferolateral  walls, hypokinesis of the basal anterolateral wall. Left ventricular diastolic parameters are consistent with Grade II diastolic dysfunction (pseudonormalization).  2. Right ventricular systolic function is mildly reduced. The right ventricular size is normal. There is normal pulmonary artery systolic pressure. The estimated right ventricular systolic pressure is 29.3 mmHg.  3. Left atrial size was severely dilated.  4. Right atrial size was mildly dilated.  5. The mitral valve is abnormal. At least moderate to possibly severe mitral valve regurgitation. This may not be fully visualized. Possible infarct-related mitral regurgitation given regional  wall motion abnormalities. No evidence of mitral stenosis.  6. The aortic valve is tricuspid. Aortic valve regurgitation is not visualized. Mild aortic  valve sclerosis is present, with no evidence of aortic valve stenosis.  7. The inferior vena cava is dilated in size with <50% respiratory variability, suggesting right atrial pressure of 15 mmHg.  Scheduled Meds:  furosemide  20 mg Intravenous Daily   hydrocortisone cream   Topical TID   metoprolol tartrate  100 mg Oral BID   pantoprazole  40 mg Oral Daily   rosuvastatin  10 mg Oral Daily    Continuous Infusions:    PRN Meds: mouth rinse, polyethylene glycol, triamcinolone cream   IMPRESSION & RECOMMENDATIONS: Jasmin Miller is a 82 y.o. 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   Impression: Atrial fibrillation, newly discovered Symptomatic anemia status post blood transfusion. Chronic HFrEF Premature ventricular contractions. History of myocardial infarction. Hypertension. Elevated high-sensitivity troponins. Elevated BNP. Non-insulin-dependent diabetes mellitus type 2. Former smoker  Recommendations: Newly discovered atrial fibrillation: Present on arrival, duration unknown. Ventricular rate has improved with last 24 hours. Rate control: Lopressor 100 mg p.o. twice daily with holding parameters. Rhythm control: N/A. Thromboembolic prophylaxis: None the setting of severe symptomatic anemia with a hemoglobin of 4.8 on arrival. I have not started amiodarone for rate/rhythm control as she is not on anticoagulation given her anemia. Initially was on IV Cardizem drip for rate control strategy but has been weaned off in the setting of mildly reduced LVEF We will await GI clearance prior to starting anticoagulation.   Ideally would recommend watchman as outpatient  if patient and family agrees. CHA2DS2-VASc SCORE is 5 which correlates to 7.2% risk of stroke per year (HTN, diabetes, age, gender).  TSH within normal limits. Continue rate control strategy.  Symptomatic anemia: Hemoglobin on arrival 4.8 g/dL Status post blood transfusion(s). GI following-appreciate recommendations.  Will await their clearance prior to trial of anticoagulation. History of AVM Would be a great candidate for left atrial appendage occlusion device, i.e. Watchman -evaluation as outpatient  Premature ventricular contractions: Improving. Continue telemetry  Chronic HFpEF History of myocardial infarction. Denies anginal chest pain. Not in florid heart failure. Troponin leak is likely secondary to supply demand ischemia in the setting of mildly reduced LVEF, A-fib with RVR, severe symptomatic anemia. Rate control strategy for reasons mentioned above for her A-fib. Will uptitrate GDMT after the anemia workup is done and blood pressures have improved. From a cardiovascular standpoint she is acceptable risk for upcoming endoscopy.  Patient's questions and concerns were addressed to her and her daughter's satisfaction. She and her daughter voices understanding of the instructions provided during this encounter.   This note was created using a voice recognition software as a result there may be grammatical errors inadvertently enclosed that do not reflect the nature of this encounter. Every attempt is made to correct such errors.  Delilah Shan [facility]  Pager:  [phone] Office: [phone] 07/01/2023, 2:05 PM

## 2023-07-01 NOTE — Progress Notes (Signed)
Daily Progress Note Intern Pager: [phone]  Patient name: Jasmin Miller Medical record number: [id number] Date of birth: April 05, 1941 Age: 82 y.o. Gender: female  Primary Care Provider: Lindaann Pascal, PA-C Consultants: Cardiology, GI Code Status: Full   Pt Overview and Major Events to Date:  7/21: Admitted to FMTS, received 2U PRBCs 7/24: Endoscopy   Assessment and Plan: Jasmin Miller is a 82 y.o. female who presented with complaint of a rash, then found to be tachycardic and with new onset Afib with RVR.  Patient is stable at this time. [facility]     * (Principal) Atrial fibrillation with RVR (HCC)     New onset, stable. TSH WNL. Echo showed LVEF 40-45%. Troponins  downtrending. - Cardiology (Dr. Odis Hollingshead) consulted, appreciate recs - per Cardiology, wean off Cardizem drip as tolerated - continue metoprolol tartrate 100mg  BID - awaiting anemia workup before considering anticoagulation - AM CBC/BMP  - Electrolyte goals of K> 4, Mg> 2; replete as necessary        DM2 (diabetes mellitus, type 2) (HCC)     Patient on metformin 500 mg daily at home.  A1c 6.5. CBGs appropriate,  somewhat low PO intake due to lack of hunger and planned procedures. - Hold metformin during hospitalization        AKI (acute kidney injury) (HCC)     Creatinine 1.55.  Baseline somewhat unclear, appears to be somewhere  around 1.  Complicated in the setting of anemia and A-fib, receiving IV  fluids with other therapies at this time. - AM BMP - Avoid nephrotoxic drugs        Anemia     This is a chronic problem. Patient received 2U PRBCs and Hgb is stable. - GI on board, plan for endoscopy this AM  - Repeat CBC in AM, consider additional transfusion as needed - Consider IV iron infusion and potential hematology consult        Chronic HFrEF (heart failure with reduced ejection fraction) (HCC)     Non-insulin dependent type 2 diabetes mellitus (HCC)     Elevated troponin level not due  to acute coronary syndrome     Premature ventricular contractions    Chronic, stable conditions: Hyperlipidemia: Continue home rosuvastatin 10 mg daily Chronic constipation: MiraLAX daily as needed GERD: Protonix 40 mg daily Postinflammatory hyperpigmentation: Continue Kenalog cream as needed CHF: 20mg  Lasix IV daily   FEN/GI: Regular diet PPx: SCDs Dispo:Home today/tomorrow. Barriers include clinical stability.   Subjective:  Patient seen this morning.  She doing well, and without complaint.  She states her rash is improving.  She denies chest pain, shortness of breath, abdominal pain.  Denies problems with hemorrhoids or hemorrhoidal bleeding this morning.  She is looking forward to endoscopy this morning.  Objective: Temp:  [98.1 F (36.7 C)-98.5 F (36.9 C)] 98.1 F (36.7 C) (07/24 0739) Pulse Rate:  [51-131] 95 (07/24 0739) Resp:  [16-18] 17 (07/24 0739) BP: (95-119)/(56-77) 119/75 (07/24 0739) SpO2:  [92 %-100 %] 96 % (07/24 0739) Weight:  [62.5 kg] 62.5 kg (07/24 0534) Physical Exam: General: Pleasant, no acute distress Cardiovascular: RRR Respiratory: CTA bilaterally.  On room air. Abdomen: Soft, nontender, nondistended. Extremities: Moves all extremities equally.  Laboratory: Most recent CBC Lab Results  Component Value Date   WBC 11.5 (H) 06/30/2023   HGB 7.6 (L) 06/30/2023   HCT 25.6 (L) 06/30/2023   MCV 69.2 (L) 06/30/2023   PLT 171 06/30/2023   Most  recent BMP    Latest Ref Rng & Units 06/30/2023   11:41 PM  BMP  Glucose 70 - 99 mg/dL 161   BUN 8 - 23 mg/dL 25   Creatinine 0.96 - 1.00 mg/dL 0.45   Sodium 409 - 811 mmol/L 137   Potassium 3.5 - 5.1 mmol/L 3.6   Chloride 98 - 111 mmol/L 107   CO2 22 - 32 mmol/L 21   Calcium 8.9 - 10.3 mg/dL 8.1   Magnesium 2.1   Cyndia Skeeters, DO 07/01/2023, 9:08 AM  PGY-1, [facility] FPTS Intern pager: [phone], text pages welcome Secure chat group [facility] Teaching  Service

## 2023-07-01 NOTE — Assessment & Plan Note (Signed)
Creatinine 1.55.  Baseline somewhat unclear, appears to be somewhere around 1.  Complicated in the setting of anemia and A-fib, receiving IV fluids with other therapies at this time. - AM BMP - Avoid nephrotoxic drugs

## 2023-07-01 NOTE — Care Management Important Message (Signed)
Important Message  Patient Details  Name: Jasmin Miller MRN: [id number] Date of Birth: [birth year]   Medicare Important Message Given:  Yes     Jasmin Miller 07/01/2023, 9:15 AM

## 2023-07-01 NOTE — Progress Notes (Signed)
Occupational Therapy Treatment Patient Details Name: Jasmin Miller MRN: [id number] DOB: May 05, 1941 Today's Date: 07/01/2023   History of present illness Pt is an 82 y/o F admitted on 06/28/23 after presenting with c/o a rash & found to be in a-fib with RVR. Pt also found to have Hgb of 4.8. PMH: DM2, HTN, HLD, a-fib vs wandering atrial pacemaker, HFpEF, MI   OT comments  Pt progressing towards goals, able to complete ambulation in room with supervision, mod I for bed mobility, reports performing ADLs this AM without assist. Pt with elevated HR to 143bpm with ambulation in room, RN notified. Pt provided with energy conservation strategy handout and began education, encouraged use of shower seat for home and pt verbalized understanding. Pt presenting with impairments listed below, will follow acutely. Continue to recommend HHOT at d/c pending progression.   Recommendations for follow up therapy are one component of a multi-disciplinary discharge planning process, led by the attending physician.  Recommendations may be updated based on patient status, additional functional criteria and insurance authorization.    Assistance Recommended at Discharge Set up Supervision/Assistance  Patient can return home with the following  A little help with walking and/or transfers;A little help with bathing/dressing/bathroom;Assistance with cooking/housework;Assist for transportation   Equipment Recommendations  Tub/shower seat    Recommendations for Other Services PT consult    Precautions / Restrictions Precautions Precautions: None Restrictions Weight Bearing Restrictions: No       Mobility Bed Mobility Overal bed mobility: Modified Independent                  Transfers Overall transfer level: Needs assistance Equipment used: None Transfers: Sit to/from Stand Sit to Stand: Supervision                 Balance Overall balance assessment: Mild deficits observed, not formally  tested   Sitting balance-Leahy Scale: Normal     Standing balance support: No upper extremity supported, During functional activity Standing balance-Leahy Scale: Poor                             ADL either performed or assessed with clinical judgement   ADL Overall ADL's : Needs assistance/impaired                         Toilet Transfer: Supervision/safety;Ambulation                  Extremity/Trunk Assessment Upper Extremity Assessment Upper Extremity Assessment: Overall WFL for tasks assessed   Lower Extremity Assessment Lower Extremity Assessment: Defer to PT evaluation        Vision   Vision Assessment?: No apparent visual deficits   Perception Perception Perception: Not tested   Praxis Praxis Praxis: Not tested    Cognition Arousal/Alertness: Awake/alert Behavior During Therapy: Flat affect, WFL for tasks assessed/performed Overall Cognitive Status: Within Functional Limits for tasks assessed                                          Exercises      Shoulder Instructions       General Comments HR up to 143 with short distance ambulation in room    Pertinent Vitals/ Pain       Pain Assessment Pain Assessment: No/denies pain  Home Living  Prior Functioning/Environment              Frequency  Min 1X/week        Progress Toward Goals  OT Goals(current goals can now be found in the care plan section)  Progress towards OT goals: Progressing toward goals  Acute Rehab OT Goals Patient Stated Goal: none stated OT Goal Formulation: With patient Time For Goal Achievement: 07/13/23 Potential to Achieve Goals: Good ADL Goals Pt Will Transfer to Toilet: with modified independence;regular height toilet;ambulating Pt Will Perform Tub/Shower Transfer: Tub transfer;Shower transfer;with modified independence;ambulating Additional ADL Goal #1: pt  will verbalize x3 energy conservation strategies in order to improve activity tolerance for ADLs Additional ADL Goal #2: pt will be able to perform 2 standing functional tasks in order to improve activity tolerance for ADLs.  Plan Discharge plan remains appropriate;Frequency remains appropriate    Co-evaluation                 AM-PAC OT "6 Clicks" Daily Activity     Outcome Measure   Help from another person eating meals?: None Help from another person taking care of personal grooming?: None Help from another person toileting, which includes using toliet, bedpan, or urinal?: A Little Help from another person bathing (including washing, rinsing, drying)?: A Little Help from another person to put on and taking off regular upper body clothing?: A Little Help from another person to put on and taking off regular lower body clothing?: A Little 6 Click Score: 20    End of Session    OT Visit Diagnosis: Unsteadiness on feet (R26.81);Other abnormalities of gait and mobility (R26.89);Muscle weakness (generalized) (M62.81)   Activity Tolerance Patient tolerated treatment well   Patient Left in bed;with call bell/phone within reach;with family/visitor present   Nurse Communication Mobility status;Other (comment) (HR)        Time: 1610-9604 OT Time Calculation (min): 15 min  Charges: OT General Charges $OT Visit: 1 Visit OT Treatments $Therapeutic Activity: 8-22 mins  Carver Fila, OTD, OTR/L SecureChat Preferred Acute Rehab [phone]   Dalphine Handing 07/01/2023, 9:35 AM

## 2023-07-02 ENCOUNTER — Other Ambulatory Visit (HOSPITAL_COMMUNITY): Payer: Self-pay

## 2023-07-02 ENCOUNTER — Encounter: Payer: Self-pay | Admitting: Hematology and Oncology

## 2023-07-02 DIAGNOSIS — I4891 Unspecified atrial fibrillation: Secondary | ICD-10-CM | POA: Diagnosis not present

## 2023-07-02 LAB — MAGNESIUM: Magnesium: 2 mg/dL (ref 1.7–2.4)

## 2023-07-02 LAB — BPAM RBC
Blood Product Expiration Date: 202408172359
Unit Type and Rh: 7300
Unit Type and Rh: 7300

## 2023-07-02 LAB — CBC
HCT: 28.8 % — ABNORMAL LOW (ref 36.0–46.0)
Hemoglobin: 8.7 g/dL — ABNORMAL LOW (ref 12.0–15.0)
MCH: 21.3 pg — ABNORMAL LOW (ref 26.0–34.0)
MCHC: 30.2 g/dL (ref 30.0–36.0)
MCV: 70.6 fL — ABNORMAL LOW (ref 80.0–100.0)
Platelets: 154 10*3/uL (ref 150–400)
RBC: 4.08 MIL/uL (ref 3.87–5.11)
RDW: 26.1 % — ABNORMAL HIGH (ref 11.5–15.5)
WBC: 10.3 10*3/uL (ref 4.0–10.5)
nRBC: 0.2 % (ref 0.0–0.2)

## 2023-07-02 LAB — BASIC METABOLIC PANEL
Anion gap: 11 (ref 5–15)
BUN: 25 mg/dL — ABNORMAL HIGH (ref 8–23)
Calcium: 7.9 mg/dL — ABNORMAL LOW (ref 8.9–10.3)
Chloride: 107 mmol/L (ref 98–111)
Creatinine, Ser: 1.39 mg/dL — ABNORMAL HIGH (ref 0.44–1.00)
GFR, Estimated: 38 mL/min — ABNORMAL LOW (ref >60)
Glucose, Bld: 129 mg/dL — ABNORMAL HIGH (ref 70–99)
Potassium: 3.6 mmol/L (ref 3.5–5.1)
Sodium: 138 mmol/L (ref 135–145)

## 2023-07-02 LAB — TYPE AND SCREEN
Antibody Screen: POSITIVE
Donor AG Type: NEGATIVE

## 2023-07-02 MED ORDER — IRON SUCROSE 500 MG IVPB - SIMPLE MED
500.0000 mg | Freq: Once | INTRAVENOUS | Status: AC
  Administered 2023-07-02: 500 mg via INTRAVENOUS
  Filled 2023-07-02: qty 275

## 2023-07-02 MED ORDER — DILTIAZEM HCL-DEXTROSE 125-5 MG/125ML-% IV SOLN (PREMIX)
5.0000 mg/h | INTRAVENOUS | Status: DC
  Administered 2023-07-03 (×2): 5 mg/h via INTRAVENOUS
  Filled 2023-07-02 (×2): qty 125

## 2023-07-02 MED ORDER — POTASSIUM CHLORIDE CRYS ER 20 MEQ PO TBCR
40.0000 meq | EXTENDED_RELEASE_TABLET | Freq: Once | ORAL | Status: AC
  Administered 2023-07-02: 40 meq via ORAL
  Filled 2023-07-02: qty 2

## 2023-07-02 NOTE — Progress Notes (Signed)
There was consult for PIV access. Patient was having dinner and wanted to come back later. Informed patient's RN regarding this. HS [employer]

## 2023-07-02 NOTE — Progress Notes (Signed)
PT Cancellation Note  Patient Details Name: Jasmin Miller MRN: [id number] DOB: October 07, 1941   Cancelled Treatment:    Reason Eval/Treat Not Completed: Patient declined, no reason specified Attempted to see pt for PT tx but pt lying in bed declining participation, not very open to educaiton re: PT in the acute setting. Pt reports she is able to walk & bathe & dress herself & declines participation at this time. Will f/u 1 more time and per protocol, if pt refuses, will d/c from PT caseload.   At this time, pt does not require f/u therapy at d/c.   Aleda Grana, PT, DPT 07/02/23, 11:49 AM    Sandi Mariscal 07/02/2023, 11:49 AM

## 2023-07-02 NOTE — Plan of Care (Signed)
Problem: Education: Goal: Ability to describe self-care measures that may prevent or decrease complications (Diabetes Survival Skills Education) will improve Outcome: Progressing Goal: Individualized Educational Video(s) Outcome: Progressing   Problem: Coping: Goal: Ability to adjust to condition or change in health will improve Outcome: Progressing   Problem: Fluid Volume: Goal: Ability to maintain a balanced intake and output will improve Outcome: Progressing   Problem: Health Behavior/Discharge Planning: Goal: Ability to identify and utilize available resources and services will improve Outcome: Progressing Goal: Ability to manage health-related needs will improve Outcome: Progressing   Problem: Metabolic: Goal: Ability to maintain appropriate glucose levels will improve Outcome: Progressing   Problem: Nutritional: Goal: Maintenance of adequate nutrition will improve Outcome: Progressing Goal: Progress toward achieving an optimal weight will improve Outcome: Progressing   Problem: Skin Integrity: Goal: Risk for impaired skin integrity will decrease Outcome: Progressing   Problem: Tissue Perfusion: Goal: Adequacy of tissue perfusion will improve Outcome: Progressing   Problem: Education: Goal: Knowledge of General Education information will improve Description: Including pain rating scale, medication(s)/side effects and non-pharmacologic comfort measures Outcome: Progressing   Problem: Health Behavior/Discharge Planning: Goal: Ability to manage health-related needs will improve Outcome: Progressing   Problem: Clinical Measurements: Goal: Ability to maintain clinical measurements within normal limits will improve Outcome: Progressing Goal: Will remain free from infection Outcome: Progressing Goal: Diagnostic test results will improve Outcome: Progressing Goal: Respiratory complications will improve Outcome: Progressing Goal: Cardiovascular complication will  be avoided Outcome: Progressing   Problem: Activity: Goal: Risk for activity intolerance will decrease Outcome: Progressing   Problem: Nutrition: Goal: Adequate nutrition will be maintained Outcome: Progressing   Problem: Coping: Goal: Level of anxiety will decrease Outcome: Progressing   Problem: Elimination: Goal: Will not experience complications related to bowel motility Outcome: Progressing Goal: Will not experience complications related to urinary retention Outcome: Progressing   Problem: Pain Managment: Goal: General experience of comfort will improve Outcome: Progressing   Problem: Safety: Goal: Ability to remain free from injury will improve Outcome: Progressing   Problem: Skin Integrity: Goal: Risk for impaired skin integrity will decrease Outcome: Progressing   Problem: Education: Goal: Knowledge of disease or condition will improve Outcome: Progressing Goal: Understanding of medication regimen will improve Outcome: Progressing Goal: Individualized Educational Video(s) Outcome: Progressing   Problem: Activity: Goal: Ability to tolerate increased activity will improve Outcome: Progressing   Problem: Education: Goal: Knowledge of General Education information will improve Description: Including pain rating scale, medication(s)/side effects and non-pharmacologic comfort measures Outcome: Progressing   Problem: Health Behavior/Discharge Planning: Goal: Ability to manage health-related needs will improve Outcome: Progressing   Problem: Clinical Measurements: Goal: Ability to maintain clinical measurements within normal limits will improve Outcome: Progressing Goal: Will remain free from infection Outcome: Progressing Goal: Diagnostic test results will improve Outcome: Progressing Goal: Respiratory complications will improve Outcome: Progressing Goal: Cardiovascular complication will be avoided Outcome: Progressing   Problem: Activity: Goal:  Risk for activity intolerance will decrease Outcome: Progressing   Problem: Nutrition: Goal: Adequate nutrition will be maintained Outcome: Progressing   Problem: Coping: Goal: Level of anxiety will decrease Outcome: Progressing   Problem: Elimination: Goal: Will not experience complications related to bowel motility Outcome: Progressing Goal: Will not experience complications related to urinary retention Outcome: Progressing   Problem: Pain Managment: Goal: General experience of comfort will improve Outcome: Progressing   Problem: Safety: Goal: Ability to remain free from injury will improve Outcome: Progressing   Problem: Skin Integrity: Goal: Risk for impaired skin integrity will decrease Outcome:  Progressing

## 2023-07-02 NOTE — Assessment & Plan Note (Signed)
Creatinine 1.39.  Baseline somewhat unclear, appears to be somewhere around 1.  Complicated in the setting of anemia and A-fib, receiving IV fluids with other therapies at this time. - AM BMP - Avoid nephrotoxic drugs

## 2023-07-02 NOTE — Progress Notes (Signed)
Confirmed with Dr. Laroy Apple that pt does NOT need to be NPO tonight and EGD currently planned for Friday. Pt notified and states understanding.

## 2023-07-02 NOTE — Progress Notes (Signed)
Progress Note  Patient Name: Jasmin Miller MRN: [id number] DOB: [birth year] Date of Encounter: 07/02/2023  Attending physician: Westley Chandler, MD Primary care provider: Lindaann Pascal, PA-C Primary Cardiologist: Dr. Truett Mainland  Subjective: Jasmin Miller is a 82 y.o. African-American female who was seen and examined at bedside  No events overnight  HR was better controlled yesterday - but back up.  Morning meds pending.  Shortness of breath w/ effort - present but stable.  No chest pain.   Objective: Vital Signs in the last 24 hours: Temp:  [97.7 F (36.5 C)-99.1 F (37.3 C)] 97.7 F (36.5 C) (07/25 0509) Pulse Rate:  [47-120] 47 (07/24 2337) Resp:  [16-18] 16 (07/25 0509) BP: (95-119)/(64-85) 113/76 (07/25 0509) SpO2:  [93 %-100 %] 97 % (07/25 0509) Weight:  [62.6 kg] 62.6 kg (07/25 0502)  Intake/Output:  Intake/Output Summary (Last 24 hours) at 07/02/2023 0630 Last data filed at 07/02/2023 0515 Gross per 24 hour  Intake 628 ml  Output --  Net 628 ml    Net IO Since Admission: 1,767.36 mL [07/02/23 0630]  Weights:     07/02/2023    5:02 AM 07/01/2023    5:34 AM 06/28/2023    6:28 PM  Last 3 Weights  Weight (lbs) 138 lb 137 lb 11.2 oz 136 lb 3.9 oz  Weight (kg) 62.596 kg 62.46 kg 61.8 kg      Telemetry:  Overnight telemetry shows Afib w/ both CVR and RVR, which I personally reviewed.   Physical examination: PHYSICAL EXAM: Vitals:   07/01/23 1950 07/01/23 2337 07/02/23 0502 07/02/23 0509  BP: 110/70 113/84  113/76  Pulse: 60 (!) 47    Resp: 18 17  16   Temp: 98.7 F (37.1 C) 98.7 F (37.1 C)  97.7 F (36.5 C)  TempSrc: Oral Oral  Oral  SpO2: 97% 93%  97%  Weight:   62.6 kg   Height:        Physical Exam  Constitutional: No distress.  Age appropriate, hemodynamically stable.   Neck: No JVD present.  Cardiovascular: Normal rate, S1 normal, S2 normal, intact distal pulses and normal pulses. An irregularly irregular rhythm present. Exam reveals no  gallop, no S3 and no S4.  Murmur heard. Systolic murmur is present with a grade of 3/6. Pulmonary/Chest: Effort normal and breath sounds normal. No stridor. She has no wheezes. She has no rales.  Abdominal: Soft. Bowel sounds are normal. She exhibits no distension. There is no abdominal tenderness.  Musculoskeletal:        General: No edema.     Cervical back: Neck supple.  Neurological: She is alert and oriented to person, place, and time. She has intact cranial nerves (2-12).  Skin: Skin is warm and moist.   Lab Results: Chemistry Recent Labs  Lab 06/28/23 0737 06/28/23 1703 06/30/23 0125 06/30/23 2341 07/02/23 0053  NA 137   < > 138 137 138  K 2.9*   < > 4.1 3.6 3.6  CL 104   < > 105 107 107  CO2 21*   < > 22 21* 20*  GLUCOSE 135*   < > 129* 138* 129*  BUN 18   < > 21 25* 25*  CREATININE 1.25*   < > 1.42* 1.55* 1.39*  CALCIUM 8.0*   < > 8.6* 8.1* 7.9*  PROT 5.7*  --   --   --   --   ALBUMIN 3.0*  --   --   --   --  AST 18  --   --   --   --   ALT 15  --   --   --   --   ALKPHOS 69  --   --   --   --   BILITOT 0.4  --   --   --   --   GFRNONAA 43*   < > 37* 33* 38*  ANIONGAP 12   < > 11 9 11    < > = values in this interval not displayed.    Hematology Recent Labs  Lab 06/30/23 0125 06/30/23 2341 07/01/23 2043 07/02/23 0053  WBC 13.1* 11.5*  --  10.3  RBC 3.92 3.70*  --  4.08  HGB 8.2* 7.6* 9.2* 8.7*  HCT 27.3* 25.6* 30.6* 28.8*  MCV 69.6* 69.2*  --  70.6*  MCH 20.9* 20.5*  --  21.3*  MCHC 30.0 29.7*  --  30.2  RDW 24.3* 25.6*  --  26.1*  PLT 192 171  --  154   High Sensitivity Troponin:   Recent Labs  Lab 06/28/23 0737 06/28/23 0929 06/28/23 1703 06/28/23 2102 06/29/23 1206  TROPONINIHS 198* 212* 224* 235* 156*     Cardiac EnzymesNo results for input(s): "TROPONINI" in the last 168 hours. No results for input(s): "TROPIPOC" in the last 168 hours.  BNP Recent Labs  Lab 06/28/23 0737  BNP 731.2*    DDimer No results for input(s): "DDIMER" in  the last 168 hours.  Hemoglobin A1c:  Lab Results  Component Value Date   HGBA1C 6.5 (H) 06/28/2023   MPG 139.85 06/28/2023   TSH  Recent Labs    06/28/23 1839  TSH 3.235   Lipid Panel  Lab Results  Component Value Date   CHOL 97 06/29/2023   HDL 45 06/29/2023   LDLCALC 42 06/29/2023   LDLDIRECT 39 06/29/2023   TRIG 51 06/29/2023   CHOLHDL 2.2 06/29/2023   Drugs of Abuse     Component Value Date/Time   LABOPIA NONE DETECTED 10/03/2015 1313   COCAINSCRNUR NONE DETECTED 10/03/2015 1313   LABBENZ NONE DETECTED 10/03/2015 1313   AMPHETMU NONE DETECTED 10/03/2015 1313   THCU NONE DETECTED 10/03/2015 1313   LABBARB NONE DETECTED 10/03/2015 1313      Imaging: No results found.  CARDIAC DATABASE: EKG: June 28, 2023: Atrial fibrillation, 127 bpm, poor R wave progression, without underlying injury pattern, frequent PVCs. July 25th, 2024: Afib w/ RVR 141bpm.   Echocardiogram: 06/29/2023  1. Left ventricular ejection fraction, by estimation, is 40 to 45%. The  left ventricle has mildly decreased function. The left ventricle  demonstrates global hypokinesis. The left ventricular internal cavity size  was mildly dilated. There is mild left  ventricular hypertrophy. Left ventricular diastolic parameters are  indeterminate.   2. Right ventricular systolic function is mildly reduced. The right  ventricular size is normal. There is mildly elevated pulmonary artery  systolic pressure. The estimated right ventricular systolic pressure is  42.9 mmHg.   3. Left atrial size was severely dilated.   4. The mitral valve is abnormal. Severe mitral valve regurgitation.   5. Tricuspid valve regurgitation is mild to moderate.   6. The aortic valve is tricuspid. Aortic valve regurgitation is not  visualized. Aortic valve sclerosis/calcification is present, without any  evidence of aortic stenosis.   7. The inferior vena cava is dilated in size with <50% respiratory  variability,  suggesting right atrial pressure of 15 mmHg.   Scheduled Meds:  furosemide  20 mg Intravenous Daily   hydrocortisone cream   Topical TID   metoprolol tartrate  100 mg Oral BID   pantoprazole  40 mg Oral Daily   rosuvastatin  10 mg Oral Daily    Continuous Infusions:    PRN Meds: mouth rinse, polyethylene glycol, triamcinolone cream   IMPRESSION & RECOMMENDATIONS: Jasmin Miller is a 82 y.o. 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   Impression: Atrial fibrillation, newly discovered Symptomatic anemia status post blood transfusion. Chronic HFrEF Premature ventricular contractions. History of myocardial infarction. Hypertension. Elevated high-sensitivity troponins. Elevated BNP. Non-insulin-dependent diabetes mellitus type 2. Former smoker  Recommendations: Newly discovered atrial fibrillation: Present on arrival, duration unknown, LA severely dilated. Rate control: Lopressor 100 mg p.o. twice daily with holding parameters. Rhythm control: N/A. Thromboembolic prophylaxis: None. In  the setting of severe symptomatic anemia with a hemoglobin of 4.8 on arrival. I have not started amiodarone for rate/rhythm control as she is not on anticoagulation given her anemia. If rates are not better controlled after evening dose of Lopressor will place her back on cardizem so that the patient can proceed w/ endoscopy.  We will await GI clearance prior to starting anticoagulation.   Ideally would recommend watchman as outpatient if patient and family agrees. CHA2DS2-VASc SCORE is 5 which correlates to 7.2% risk of stroke per year (HTN, diabetes, age, gender).  TSH within normal limits.  Symptomatic anemia: Hemoglobin on arrival 4.8 g/dL Status post blood transfusion(s). GI following-appreciate recommendations.  Will await  their clearance prior to trial of anticoagulation.   Tentatively endoscopy scheduled for 07/03/2023 History of AVM Would be a great candidate for left atrial appendage occlusion device, i.e. Watchman -evaluation as outpatient  Premature ventricular contractions: Burden much improved.  Monitor for now.   Chronic HFrEF History of myocardial infarction. Denies anginal chest pain. Not in florid heart failure. Troponin leak is likely secondary to supply demand ischemia in the setting of mildly reduced LVEF, A-fib with RVR, severe symptomatic anemia. Rate control strategy for reasons mentioned above for her A-fib. Continue Lasix IVP daily  Will uptitrate GDMT after the anemia workup is done and blood pressures have improved. From a cardiovascular standpoint she is acceptable risk for upcoming endoscopy.  Plan of care d/w Dr. Cyndia Skeeters .   Patient's questions and concerns were addressed to her and her daughter's satisfaction. She and her daughter voices understanding of the instructions provided during this encounter.   This note was created using a voice recognition software as a result there may be grammatical errors inadvertently enclosed that do not reflect the nature of this encounter. Every attempt is made to correct such errors.  Delilah Shan [facility]  Pager:  [phone] Office: [phone] 07/02/2023, 9:13 AM

## 2023-07-02 NOTE — Assessment & Plan Note (Addendum)
Pulse rate continues to fluctuate but remains overall tachycardic. Patient has weaned off of cardizem drip. - Cardiology (Dr. Odis Hollingshead) consulted, appreciate recs -Per cardiology restart Cardizem drip this evening 2 hours after evening metoprolol dose to control heart rate and allow for endoscopy tomorrow - continue metoprolol tartrate 100mg  BID - awaiting anemia workup before considering anticoagulation - AM CBC/BMP  - Electrolyte goals of K> 4, Mg> 2; replete as necessary

## 2023-07-02 NOTE — Progress Notes (Signed)
Messaged with Dr. Odis Hollingshead with cardiology, after reassessing patient 2 hours after metoprolol administration I went to floor and reviewed her telemetry. She is currently 110-118. Plan to restart diltiazem drip to ensure rates are controlled for EGD in AM.

## 2023-07-02 NOTE — Assessment & Plan Note (Signed)
Patient on metformin 500 mg daily at home.  A1c 6.5. CBGs appropriate, somewhat low PO intake due to lack of hunger and planned procedures. - Hold metformin during hospitalization

## 2023-07-02 NOTE — Plan of Care (Signed)
  Problem: Education: Goal: Ability to describe self-care measures that may prevent or decrease complications (Diabetes Survival Skills Education) will improve Outcome: Progressing Goal: Individualized Educational Video(s) Outcome: Progressing   Problem: Health Behavior/Discharge Planning: Goal: Ability to identify and utilize available resources and services will improve Outcome: Progressing Goal: Ability to manage health-related needs will improve Outcome: Progressing   Problem: Nutritional: Goal: Maintenance of adequate nutrition will improve Outcome: Progressing Goal: Progress toward achieving an optimal weight will improve Outcome: Progressing   Problem: Clinical Measurements: Goal: Ability to maintain clinical measurements within normal limits will improve Outcome: Progressing Goal: Will remain free from infection Outcome: Progressing Goal: Diagnostic test results will improve Outcome: Progressing Goal: Respiratory complications will improve Outcome: Progressing Goal: Cardiovascular complication will be avoided Outcome: Progressing

## 2023-07-02 NOTE — Assessment & Plan Note (Addendum)
This is a chronic problem. Patient received additional 1U PRBCs, hemoglobin is 8.7 this a.m. - GI on board, plan for endoscopy tomorrow - Repeat CBC in AM, consider additional transfusions as needed -IV iron -Consider hematology consult

## 2023-07-02 NOTE — Progress Notes (Signed)
Daily Progress Note Intern Pager: [phone]  Patient name: Jasmin Miller Medical record number: [id number] Date of birth: Apr 26, 1941 Age: 82 y.o. Gender: female  Primary Care Provider: Lindaann Pascal, PA-C Consultants: Cardiology, GI Code Status: Full   Pt Overview and Major Events to Date:  7/21: Admitted to FMTS, received 2U PRBCs   Assessment and Plan: Jasmin Miller is a 82 y.o. female who presented with complaint of a rash, then found to be tachycardic and with new onset Afib with RVR.   Patient is stable at this time.  [facility]     * (Principal) Atrial fibrillation with rapid ventricular response (HCC)      Pulse rate continues to fluctuate but remains overall tachycardic.  Patient has weaned off of cardizem drip. - Cardiology (Dr. Odis Hollingshead) consulted, appreciate recs -Per cardiology restart Cardizem drip this evening 2 hours after evening  metoprolol dose to control heart rate and allow for endoscopy tomorrow - continue metoprolol tartrate 100mg  BID - awaiting anemia workup before considering anticoagulation - AM CBC/BMP  - Electrolyte goals of K> 4, Mg> 2; replete as necessary        DM2 (diabetes mellitus, type 2) (HCC)     Patient on metformin 500 mg daily at home.  A1c 6.5. CBGs appropriate,  somewhat low PO intake due to lack of hunger and planned procedures. - Hold metformin during hospitalization        AKI (acute kidney injury) (HCC)     Creatinine 1.39.  Baseline somewhat unclear, appears to be somewhere  around 1.  Complicated in the setting of anemia and A-fib, receiving IV  fluids with other therapies at this time. - AM BMP - Avoid nephrotoxic drugs        Anemia     This is a chronic problem. Patient received additional 1U PRBCs,  hemoglobin is 8.7 this a.m. - GI on board, plan for endoscopy tomorrow - Repeat CBC in AM, consider additional transfusions as needed -IV iron -Consider hematology consult      Chronic, stable  conditions: Hyperlipidemia: Continue home rosuvastatin 10 mg daily Chronic constipation: MiraLAX daily as needed GERD: Protonix 40 mg daily Postinflammatory hyperpigmentation: Continue Kenalog cream as needed CHF: 20mg  Lasix IV daily   FEN/GI: Regular diet PPx: SCDs Dispo:Home Friday pending endoscopy. Barriers include clinical stability.   Subjective:  Seen this morning sitting in bed.  She is somewhat frustrated with the prolonged nature of her hospital course.  She states she is only taking metoprolol 1 time a day, however medication history review confirms she is getting this twice a day.  She denies chest pain, shortness of breath.  Reports 1 bowel movement yesterday.  Denies bleeding from any sources.  Objective: Temp:  [97.3 F (36.3 C)-98.7 F (37.1 C)] 97.3 F (36.3 C) (07/25 1402) Pulse Rate:  [47-120] 120 (07/25 0829) Resp:  [16-18] 18 (07/25 0829) BP: (104-118)/(64-84) 106/68 (07/25 1402) SpO2:  [93 %-100 %] 95 % (07/25 1402) Weight:  [62.6 kg] 62.6 kg (07/25 0502) Physical Exam: General: No acute distress Cardiovascular: RRR Respiratory: CTA bilaterally.  On room air Abdomen: Normoactive bowel sounds.  Soft, nontender Extremities: Moves all extremities equally  Laboratory: Most recent CBC Lab Results  Component Value Date   WBC 10.3 07/02/2023   HGB 8.7 (L) 07/02/2023   HCT 28.8 (L) 07/02/2023   MCV 70.6 (L) 07/02/2023   PLT 154 07/02/2023   Most recent BMP    Latest Ref  Rng & Units 07/02/2023   12:53 AM  BMP  Glucose 70 - 99 mg/dL 161   BUN 8 - 23 mg/dL 25   Creatinine 0.96 - 1.00 mg/dL 0.45   Sodium 409 - 811 mmol/L 138   Potassium 3.5 - 5.1 mmol/L 3.6   Chloride 98 - 111 mmol/L 107   CO2 22 - 32 mmol/L 20   Calcium 8.9 - 10.3 mg/dL 7.9    Magnesium 2.0  Cyndia Skeeters, DO 07/02/2023, 3:14 PM  PGY-1, [facility] FPTS Intern pager: [phone], text pages welcome Secure chat group [facility] Teaching Service

## 2023-07-02 NOTE — TOC Benefit Eligibility Note (Signed)
Pharmacy Patient Advocate Encounter  Insurance verification completed.    The patient is insured through [facility]   Ran test claim for Eliquis 5 mg and the current 30 day co-pay is $0.00.  Ran test claim for Xarelto 20 mg and the current 30 day co-pay is $0.00.   This test claim was processed through [facility]- copay amounts may vary at other pharmacies due to pharmacy/plan contracts, or as the patient moves through the different stages of their insurance plan.    Roland Earl, CPHT Pharmacy Patient Advocate Specialist [facility] Patient Advocate Team Direct Number: [phone]  Fax: [phone]

## 2023-07-03 ENCOUNTER — Encounter (HOSPITAL_COMMUNITY)
Admission: EM | Disposition: A | Discharge status: Home / Self Care | Payer: Self-pay | Source: Home / Self Care | Attending: Family Medicine

## 2023-07-03 ENCOUNTER — Inpatient Hospital Stay (HOSPITAL_COMMUNITY): Payer: 59 | Admitting: Certified Registered"

## 2023-07-03 DIAGNOSIS — K31819 Angiodysplasia of stomach and duodenum without bleeding: Secondary | ICD-10-CM | POA: Diagnosis not present

## 2023-07-03 DIAGNOSIS — K2970 Gastritis, unspecified, without bleeding: Secondary | ICD-10-CM

## 2023-07-03 DIAGNOSIS — I110 Hypertensive heart disease with heart failure: Secondary | ICD-10-CM

## 2023-07-03 DIAGNOSIS — D649 Anemia, unspecified: Secondary | ICD-10-CM

## 2023-07-03 DIAGNOSIS — Z87891 Personal history of nicotine dependence: Secondary | ICD-10-CM

## 2023-07-03 DIAGNOSIS — I4891 Unspecified atrial fibrillation: Secondary | ICD-10-CM | POA: Diagnosis not present

## 2023-07-03 DIAGNOSIS — I5022 Chronic systolic (congestive) heart failure: Secondary | ICD-10-CM | POA: Diagnosis not present

## 2023-07-03 HISTORY — PX: ESOPHAGOGASTRODUODENOSCOPY (EGD) WITH PROPOFOL: SHX5813

## 2023-07-03 HISTORY — PX: GIVENS CAPSULE STUDY: SHX5432

## 2023-07-03 LAB — GLUCOSE, CAPILLARY
Glucose-Capillary: 111 mg/dL — ABNORMAL HIGH (ref 70–99)
Glucose-Capillary: 101 mg/dL — ABNORMAL HIGH (ref 70–99)

## 2023-07-03 SURGERY — ESOPHAGOGASTRODUODENOSCOPY (EGD) WITH PROPOFOL
Anesthesia: Monitor Anesthesia Care

## 2023-07-03 SURGERY — GIVENS CAPSULE STUDY
Anesthesia: LOCAL

## 2023-07-03 MED ORDER — DEXMEDETOMIDINE HCL IN NACL 80 MCG/20ML IV SOLN
INTRAVENOUS | Status: AC
  Filled 2023-07-03: qty 20

## 2023-07-03 MED ORDER — PROPOFOL 500 MG/50ML IV EMUL
INTRAVENOUS | Status: AC
  Filled 2023-07-03: qty 200

## 2023-07-03 MED ORDER — SODIUM CHLORIDE 0.9 % IV SOLN: INTRAVENOUS | Status: DC

## 2023-07-03 MED ORDER — LACTATED RINGERS IV SOLN: INTRAVENOUS | Status: DC

## 2023-07-03 MED ORDER — PHENYLEPHRINE 80 MCG/ML (10ML) SYRINGE FOR IV PUSH (FOR BLOOD PRESSURE SUPPORT)
PREFILLED_SYRINGE | INTRAVENOUS | Status: DC | PRN
  Administered 2023-07-03 (×2): 80 ug via INTRAVENOUS

## 2023-07-03 MED ORDER — LIDOCAINE 2% (20 MG/ML) 5 ML SYRINGE
INTRAMUSCULAR | Status: DC | PRN
  Administered 2023-07-03: 40 mg via INTRAVENOUS

## 2023-07-03 MED ORDER — PROPOFOL 500 MG/50ML IV EMUL
INTRAVENOUS | Status: DC | PRN
  Administered 2023-07-03 (×2): 20 mg via INTRAVENOUS
  Administered 2023-07-03: 100 ug/kg/min via INTRAVENOUS

## 2023-07-03 SURGICAL SUPPLY — 15 items

## 2023-07-03 NOTE — Assessment & Plan Note (Signed)
Creatinine 1.39.  Baseline somewhat unclear, appears to be somewhere around 1.  Complicated in the setting of anemia and A-fib, receiving IV fluids with other therapies at this time. - AM BMP - Avoid nephrotoxic drugs

## 2023-07-03 NOTE — Progress Notes (Signed)
Pt ingested pill capsule at 9:45. Instructions reviewed with patient. Instruction sheet sent with patient to bedside.

## 2023-07-03 NOTE — Assessment & Plan Note (Signed)
This is a chronic problem. Patient received additional 1U PRBCs, hemoglobin is 8.7 this a.m. 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  -Per GI, patient to remain n.p.o. for capsule endoscopy today - Repeat CBC in AM, consider additional transfusions as needed -IV iron -Consider hematology consult

## 2023-07-03 NOTE — Op Note (Signed)
[facility] Patient Name: Jasmin Miller Procedure Date : 07/03/2023 MRN: [id number] Attending MD: Willis Modena , MD, [id number] Date of Birth: 31-Aug-1941 CSN: [id number] Age: 82 Admit Type: Inpatient Procedure:                Upper GI endoscopy Indications:              Iron deficiency anemia Providers:                Willis Modena, MD, Doristine Mango, RN, Priscella Mann, Technician Referring MD:             [facility] Medicines:                Monitored Anesthesia Care Complications:            No immediate complications. Estimated Blood Loss:     Estimated blood loss: none. Procedure:                Pre-Anesthesia Assessment:                           - Prior to the procedure, a History and Physical                            was performed, and patient medications and                            allergies were reviewed. The patient's tolerance of                            previous anesthesia was also reviewed. The risks                            and benefits of the procedure and the sedation                            options and risks were discussed with the patient.                            All questions were answered, and informed consent                            was obtained. Prior Anticoagulants: The patient has                            taken no anticoagulant or antiplatelet agents. ASA                            Grade Assessment: III - A patient with severe                            systemic disease. After reviewing the risks and  benefits, the patient was deemed in satisfactory                            condition to undergo the procedure.                           After obtaining informed consent, the endoscope was                            passed under direct vision. Throughout the                            procedure, the patient's blood pressure, pulse, and                             oxygen saturations were monitored continuously. The                            [id number]-[id number] ([id number]) Olympus endoscope was introduced                            through the mouth, and advanced to the second part                            of duodenum. The upper GI endoscopy was                            accomplished without difficulty. The patient                            tolerated the procedure well. Scope In: Scope Out: Findings:      The examined esophagus was normal.      Patchy mild inflammation was found in the entire examined stomach.      The exam of the stomach was otherwise normal.      A single diminutive angioectasia without bleeding was found in the first       portion of the duodenum.      The exam of the duodenum was otherwise normal. Impression:               - Normal esophagus.                           - Gastritis.                           - A single non-bleeding angioectasia in the                            duodenum.                           - No specimens collected. Recommendation:           - Return patient to hospital ward for ongoing care.                           -  NPO until further notice (to do capsule study)                           - To visualize the small bowel, perform video                            capsule endoscopy today.                           [facility] will follow. Procedure Code(s):        --- Professional ---                           [phone], Esophagogastroduodenoscopy, flexible,                            transoral; diagnostic, including collection of                            specimen(s) by brushing or washing, when performed                            (separate procedure) Diagnosis Code(s):        --- Professional ---                           K29.70, Gastritis, unspecified, without bleeding                           K31.819, Angiodysplasia of stomach and duodenum                            without bleeding                            D50.9, Iron deficiency anemia, unspecified CPT copyright 2022 American [employer] Association. All rights reserved. The codes documented in this report are preliminary and upon coder review may  be revised to meet current compliance requirements. Willis Modena, MD 07/03/2023 9:33:54 AM This report has been signed electronically. Number of Addenda: 0

## 2023-07-03 NOTE — Progress Notes (Signed)
Patient in endoscopy.  Will either round later or tomorrow. Call if questions arise.   No charge.   Tessa Lerner, [facility]

## 2023-07-03 NOTE — Anesthesia Preprocedure Evaluation (Signed)
Anesthesia Evaluation  Patient identified by MRN, date of birth, ID band Patient awake    Reviewed: Allergy & Precautions, H&P , NPO status , Patient's Chart, lab work & pertinent test results  Airway Mallampati: II   Neck ROM: full    Dental   Pulmonary former smoker   breath sounds clear to auscultation       Cardiovascular hypertension, + Past MI and +CHF  + dysrhythmias Atrial Fibrillation  Rhythm:irregular Rate:Tachycardia     Neuro/Psych    GI/Hepatic   Endo/Other  diabetes, Type 2    Renal/GU      Musculoskeletal   Abdominal   Peds  Hematology  (+) Blood dyscrasia, anemia   Anesthesia Other Findings   Reproductive/Obstetrics                             Anesthesia Physical Anesthesia Plan  ASA: 3  Anesthesia Plan: MAC   Post-op Pain Management:    Induction: Intravenous  PONV Risk Score and Plan: 2 and Propofol infusion and Treatment may vary due to age or medical condition  Airway Management Planned: Nasal Cannula  Additional Equipment:   Intra-op Plan:   Post-operative Plan:   Informed Consent: I have reviewed the patients History and Physical, chart, labs and discussed the procedure including the risks, benefits and alternatives for the proposed anesthesia with the patient or authorized representative who has indicated his/her understanding and acceptance.     Dental advisory given  Plan Discussed with: CRNA, Anesthesiologist and Surgeon  Anesthesia Plan Comments:        Anesthesia Quick Evaluation

## 2023-07-03 NOTE — Interval H&P Note (Signed)
History and Physical Interval Note:  07/03/2023 8:45 AM  Jasmin Miller  has presented today for surgery, with the diagnosis of anemia.  The various methods of treatment have been discussed with the patient and family. After consideration of risks, benefits and other options for treatment, the patient has consented to  Procedure(s): ESOPHAGOGASTRODUODENOSCOPY (EGD) WITH PROPOFOL (Left) as a surgical intervention.  The patient's history has been reviewed, patient examined, no change in status, stable for surgery.  I have reviewed the patient's chart and labs.  Questions were answered to the patient's satisfaction.     [name]

## 2023-07-03 NOTE — Transfer of Care (Signed)
Immediate Anesthesia Transfer of Care Note  Patient: Jasmin Miller  Procedure(s) Performed: ESOPHAGOGASTRODUODENOSCOPY (EGD) WITH PROPOFOL (Left)  Patient Location: PACU  Anesthesia Type:MAC  Level of Consciousness: drowsy  Airway & Oxygen Therapy: Patient Spontanous Breathing and Patient connected to nasal cannula oxygen  Post-op Assessment: Report given to RN and Post -op Vital signs reviewed and stable  Post vital signs: Reviewed and stable  Last Vitals:  Vitals Value Taken Time  BP 115/61 07/03/23 0926  Temp    Pulse 111 07/03/23 0927  Resp 23 07/03/23 0927  SpO2 97 % 07/03/23 0927  Vitals shown include unfiled device data.  Last Pain:  Vitals:   07/03/23 0735  TempSrc: Oral  PainSc: 0-No pain         Complications: No notable events documented.

## 2023-07-03 NOTE — Plan of Care (Signed)
  Problem: Coping: Goal: Ability to adjust to condition or change in health will improve Outcome: Progressing   Problem: Fluid Volume: Goal: Ability to maintain a balanced intake and output will improve Outcome: Progressing   Problem: Health Behavior/Discharge Planning: Goal: Ability to identify and utilize available resources and services will improve Outcome: Progressing   Problem: Nutritional: Goal: Maintenance of adequate nutrition will improve Outcome: Progressing Goal: Progress toward achieving an optimal weight will improve Outcome: Progressing

## 2023-07-03 NOTE — Progress Notes (Addendum)
Daily Progress Note Intern Pager: [phone]  Patient name: Jasmin Miller Medical record number: [id number] Date of birth: 09-29-1941 Age: 82 y.o. 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 female who presented with complaint of a rash, then found to be tachycardic and with new onset Afib with RVR.   Patient is stable at this time.  Jasmin Miller     * (Principal) Atrial fibrillation with rapid ventricular response (HCC)      Pulse rate continues to fluctuate but remains overall tachycardic.   Patient restarted on Cardizem drip for heart rate control. - Cardiology (Dr. Odis Hollingshead) consulted, appreciate recs -Attempt to wean Cardizem drip - continue metoprolol tartrate 100mg  BID - awaiting anemia workup before considering anticoagulation - AM CBC/BMP  - Electrolyte goals of K> 4, Mg> 2; replete as necessary -Consider starting amiodarone after anticoagulation        DM2 (diabetes mellitus, type 2) (HCC)     Patient on metformin 500 mg daily at home.  A1c 6.5. CBGs appropriate,  somewhat low PO intake due to lack of hunger and planned procedures. - Hold metformin during hospitalization        AKI (acute kidney injury) (HCC)     Creatinine 1.39.  Baseline somewhat unclear, appears to be somewhere  around 1.  Complicated in the setting of anemia and A-fib, receiving IV  fluids with other therapies at this time. - AM BMP - Avoid nephrotoxic drugs        Anemia     This is a chronic problem. Patient received additional 1U PRBCs,  hemoglobin is 8.7 this a.m. 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  -Per GI, patient to remain n.p.o. for  capsule endoscopy today - Repeat CBC in AM, consider additional transfusions as needed -IV iron -Consider hematology consult      Chronic, stable conditions: Hyperlipidemia: Continue home rosuvastatin 10 mg daily Chronic constipation: MiraLAX daily as needed GERD: Protonix 40 mg daily Postinflammatory hyperpigmentation: Continue Kenalog cream as needed CHF: 20mg  Lasix IV daily   FEN/GI: N.p.o. at this time for capsule endoscopy PPx: SCDs Dispo:Home Friday pending endoscopy. Barriers include clinical stability.   Subjective:  Patient seen resting in bed after EGD.  She is tired and states "I am doing fine."  She is without complaint.  Objective: Temp:  [97.3 F (36.3 C)-98.9 F (37.2 C)] 97.6 F (36.4 C) (07/26 0945) Pulse Rate:  [100-126] 109 (07/26 0945) Resp:  [18-26] 20 (07/26 0945) BP: (100-134)/(54-87) 128/68 (07/26 0945) SpO2:  [93 %-98 %] 94 % (07/26 0945) Weight:  [63.2 kg] 63.2 kg (07/26 0735) Physical Exam: General: Tired appearing, no acute distress Cardiovascular: RRR Respiratory: CTA bilaterally Abdomen: Soft, nontender, nondistended Extremities: No lower extremity edema bilaterally  Laboratory: Most recent CBC Lab Results  Component Value Date   WBC 12.4 (H) 07/03/2023   HGB 9.4 (L) 07/03/2023   HCT 31.7 (L) 07/03/2023   MCV 73.7 (L) 07/03/2023   PLT 144 (L) 07/03/2023   Most recent BMP    Latest Ref Rng & Units 07/03/2023   12:16 AM  BMP  Glucose 70 - 99 mg/dL 948   BUN  8 - 23 mg/dL 22   Creatinine 3.47 - 1.00 mg/dL 4.25   Sodium 956 - 387 mmol/L 141   Potassium 3.5 - 5.1 mmol/L 4.1   Chloride 98 - 111 mmol/L 111   CO2 22 - 32 mmol/L 21   Calcium 8.9 - 10.3 mg/dL 8.4   Magnesium 2.1  5/64/3329 EGD: Normal esophagus.  Gastritis.  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  Jasmin Skeeters, DO 07/03/2023, 12:56 PM  PGY-1, [facility] Family [facility] FPTS Intern pager: [phone], text pages welcome Secure chat group [facility] Teaching Service

## 2023-07-03 NOTE — Assessment & Plan Note (Signed)
Patient on metformin 500 mg daily at home.  A1c 6.5. CBGs appropriate, somewhat low PO intake due to lack of hunger and planned procedures. - Hold metformin during hospitalization

## 2023-07-03 NOTE — Assessment & Plan Note (Addendum)
Pulse rate continues to fluctuate but remains overall tachycardic.  Patient was restarted on Cardizem drip for heart rate control. - Cardiology (Dr. Odis Hollingshead) consulted, appreciate recs - Started on IV digoxin per Cardiology -Attempt to wean Cardizem drip today - continue metoprolol tartrate 100mg  BID - awaiting final anemia workup before considering anticoagulation - AM CBC/BMP  - Electrolyte goals of K> 4, Mg> 2; replete as necessary -Consider starting amiodarone after anticoagulation

## 2023-07-03 NOTE — Progress Notes (Signed)
PT Cancellation Note  Patient Details Name: Jasmin Miller MRN: [id number] DOB: 29-Dec-1940   Cancelled Treatment:    Reason Eval/Treat Not Completed: PT screened, no needs identified, will sign off - PT sign off as pt is independent with mobility, and feels she is at baseline.  [name], PT DPT Acute Rehabilitation Services Secure Chat Preferred  Office [phone]    [name] 07/03/2023, 2:50 PM

## 2023-07-03 NOTE — Plan of Care (Signed)
  Problem: Education: Goal: Ability to describe self-care measures that may prevent or decrease complications (Diabetes Survival Skills Education) will improve Outcome: Progressing   Problem: Coping: Goal: Ability to adjust to condition or change in health will improve Outcome: Progressing   

## 2023-07-04 DIAGNOSIS — I4891 Unspecified atrial fibrillation: Secondary | ICD-10-CM

## 2023-07-04 DIAGNOSIS — D649 Anemia, unspecified: Secondary | ICD-10-CM | POA: Diagnosis not present

## 2023-07-04 DIAGNOSIS — I5021 Acute systolic (congestive) heart failure: Secondary | ICD-10-CM

## 2023-07-04 LAB — GLUCOSE, CAPILLARY
Glucose-Capillary: 125 mg/dL — ABNORMAL HIGH (ref 70–99)
Glucose-Capillary: 125 mg/dL — ABNORMAL HIGH (ref 70–99)

## 2023-07-04 LAB — BRAIN NATRIURETIC PEPTIDE: B Natriuretic Peptide: 2650.6 pg/mL — ABNORMAL HIGH (ref 0.0–100.0)

## 2023-07-04 MED ORDER — FUROSEMIDE 10 MG/ML IJ SOLN
20.0000 mg | Freq: Two times a day (BID) | INTRAMUSCULAR | Status: DC
  Filled 2023-07-04: qty 2

## 2023-07-04 MED ORDER — ACETAMINOPHEN 325 MG PO TABS
650.0000 mg | ORAL_TABLET | Freq: Four times a day (QID) | ORAL | Status: DC | PRN
  Administered 2023-07-04 – 2023-07-06 (×3): 650 mg via ORAL
  Filled 2023-07-04 (×3): qty 2

## 2023-07-04 MED ORDER — EMPAGLIFLOZIN 10 MG PO TABS
10.0000 mg | ORAL_TABLET | Freq: Every day | ORAL | Status: DC
  Administered 2023-07-04 – 2023-07-07 (×4): 10 mg via ORAL
  Filled 2023-07-04 (×4): qty 1

## 2023-07-04 MED ORDER — POTASSIUM CHLORIDE CRYS ER 20 MEQ PO TBCR
20.0000 meq | EXTENDED_RELEASE_TABLET | Freq: Once | ORAL | Status: AC
  Administered 2023-07-04: 20 meq via ORAL
  Filled 2023-07-04: qty 1

## 2023-07-04 MED ORDER — HEPARIN (PORCINE) 25000 UT/250ML-% IV SOLN
900.0000 [IU]/h | INTRAVENOUS | Status: DC
  Administered 2023-07-04: 900 [IU]/h via INTRAVENOUS
  Filled 2023-07-04: qty 250

## 2023-07-04 MED ORDER — FUROSEMIDE 10 MG/ML IJ SOLN
40.0000 mg | Freq: Once | INTRAMUSCULAR | Status: AC
  Administered 2023-07-04: 40 mg via INTRAVENOUS
  Filled 2023-07-04: qty 4

## 2023-07-04 MED ORDER — DIGOXIN 0.25 MG/ML IJ SOLN
0.2500 mg | Freq: Once | INTRAMUSCULAR | Status: AC
  Administered 2023-07-04: 0.25 mg via INTRAVENOUS
  Filled 2023-07-04: qty 2

## 2023-07-04 MED ORDER — DIGOXIN 0.25 MG/ML IJ SOLN
0.5000 mg | Freq: Once | INTRAMUSCULAR | Status: AC
  Administered 2023-07-04: 0.5 mg via INTRAVENOUS
  Filled 2023-07-04: qty 2

## 2023-07-04 MED ORDER — MAGNESIUM SULFATE IN D5W 1-5 GM/100ML-% IV SOLN
1.0000 g | Freq: Once | INTRAVENOUS | Status: AC
  Administered 2023-07-04: 1 g via INTRAVENOUS
  Filled 2023-07-04: qty 100

## 2023-07-04 NOTE — Progress Notes (Addendum)
FMTS Attending Daily Note: Terisa Starr, MD  Team Pager [phone] Pager [phone]  I have personally seen and examined this patient. I have reviewed their chart. I have discussed this patient with the resident physician.  Addendums to below note include:  - Capsule endoscopy negative--will discuss Eliquis with Dr. 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    Disposition: possibly Monday if stable Hgb on anticoagulation       Daily Progress Note Intern Pager: [phone]  Patient name: Jasmin Miller Medical record number: [id number] Date of birth: 1941-07-05 Age: 82 y.o. 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 female who presented with complaint of a rash, then found to be tachycardic and with new onset Afib with RVR.  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  Patient is stable and should be appropriate for discharge within the next couple of days. Patient was started on digoxin this Am per Cardiology.  Awaiting weaning from diltiazem drip and starting appropriate anticoagulation for patient's atrial fibrillation.   Patient is stable at this time.  [facility]     * (Principal) Atrial fibrillation with rapid ventricular response (HCC)      Pulse rate continues to fluctuate but remains overall tachycardic.   Patient was restarted on Cardizem drip for  heart rate control. - Cardiology (Dr. Odis Hollingshead) consulted, appreciate recs - Started on IV digoxin per Cardiology -Attempt to wean Cardizem drip today - continue metoprolol tartrate 100mg  BID - awaiting final anemia workup before considering anticoagulation - AM CBC/BMP  - Electrolyte goals of K> 4, Mg> 2; replete as necessary -Consider starting amiodarone after anticoagulation        DM2 (diabetes mellitus, type 2) (HCC)     Patient on metformin 500 mg daily at home.  A1c 6.5. CBGs appropriate,  somewhat low PO intake due to lack of hunger and planned procedures. - Hold metformin during hospitalization        AKI (acute kidney injury) (HCC)     Creatinine 1.13.  Baseline somewhat unclear, appears to be somewhere  around 1.  Complicated in the setting of anemia and A-fib, receiving IV  fluids with other therapies at this time. - AM BMP - Avoid nephrotoxic drugs        Anemia     Hemoglobin 8.7 this morning.  This is a chronic problem. 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  Awaiting  results of capsule endoscopy. -GI on board, appreciate recs - Repeat CBC in AM, consider additional transfusions as needed -IV iron -Consider hematology consult     Chronic, stable conditions: Hyperlipidemia: Continue home rosuvastatin 10 mg daily Chronic constipation: MiraLAX daily as needed GERD: Protonix 40 mg daily Postinflammatory hyperpigmentation: Continue Kenalog cream as needed CHF: 20mg  Lasix IV daily  FEN/GI: Heart diet PPx: SCDs; holding chemical ppx at this time pending anemia workup and  bleeding r/o Dispo:Home tomorrow. Barriers include titrating off of Cardizem drip, tolerating p.o. medications.   Subjective:  Patient seen this morning sitting up in bed eating breakfast.  She is somewhat quiet and reserved this morning, but overall without complaint.  She asks about the plan and I answered her questions to the best of my ability.  She denies  chest pain, racing heart, shortness of breath.  Objective: Temp:  [98.1 F (36.7 C)-98.5 F (36.9 C)] 98.1 F (36.7 C) (07/27 0733) Pulse Rate:  [70-112] 108 (07/27 0733) Resp:  [18-20] 20 (07/27 0733) BP: (106-128)/(62-91) 128/72 (07/27 0733) SpO2:  [93 %-97 %] 97 % (07/27 0733) Physical Exam: General: Tired appearing, no acute distress Cardiovascular: Rhythm irregularly irregular, intact radial and pedal pulses Respiratory: CTA bilaterally Abdomen: Normoactive bowel sounds, soft, nontender Extremities: Moves all extremities equally.  No lower extremity edema bilaterally.  Laboratory: Most recent CBC Lab Results  Component Value Date   WBC 9.3 07/04/2023   HGB 8.7 (L) 07/04/2023   HCT 29.7 (L) 07/04/2023   MCV 72.6 (L) 07/04/2023   PLT 127 (L) 07/04/2023   Most recent BMP    Latest Ref Rng & Units 07/04/2023   12:59 AM  BMP  Glucose 70 - 99 mg/dL 433   BUN 8 - 23 mg/dL 16   Creatinine 2.95 - 1.00 mg/dL 1.88   Sodium 416 - 606 mmol/L 140   Potassium 3.5 - 5.1 mmol/L 3.8   Chloride 98 - 111 mmol/L 111   CO2 22 - 32 mmol/L 19   Calcium 8.9 - 10.3 mg/dL 8.5   Magnesium 1.9  Cyndia Skeeters, DO 07/04/2023, 10:15 AM  PGY-1, [facility] Family Medicine FPTS Intern pager: [phone], text pages welcome Secure chat group [facility] Teaching Service

## 2023-07-04 NOTE — Progress Notes (Signed)
ANTICOAGULATION CONSULT NOTE - Initial Consult  Pharmacy Consult for heparin Indication: atrial fibrillation  No Known Allergies  Patient Measurements: Height: 5\' 1"  (154.9 cm) Weight: 63.2 kg (139 lb 5.3 oz) IBW/kg (Calculated) : 47.8 Heparin Dosing Weight: 61 kg  Vital Signs: Temp: 98.1 F (36.7 C) (07/27 0733) Temp Source: Oral (07/27 0733) BP: 128/72 (07/27 0733) Pulse Rate: 108 (07/27 0733)  Labs: Recent Labs    07/02/23 0053 07/03/23 0016 07/04/23 0059  HGB 8.7* 9.4* 8.7*  HCT 28.8* 31.7* 29.7*  PLT 154 144* 127*  CREATININE 1.39* 1.26* 1.13*    Estimated Creatinine Clearance: 32.7 mL/min (A) (by C-G formula based on SCr of 1.13 mg/dL (H)).   Medical History: Past Medical History:  Diagnosis Date   DM2 (diabetes mellitus, type 2) (HCC)    Hypertension    MI (myocardial infarction) (HCC)    Symptomatic anemia    11/2018    Medications:  Medications Prior to Admission  Medication Sig Dispense Refill Last Dose   amLODipine (NORVASC) 5 MG tablet TAKE 1 TABLET(5 MG) BY MOUTH DAILY (Patient taking differently: Take 5 mg by mouth every evening.) 90 tablet 3 06/27/2023   Ferrous Sulfate Dried (HIGH POTENCY IRON) 65 MG TABS Take 1 capsule by mouth daily.   Past Week   furosemide (LASIX) 40 MG tablet Take 1 tablet (40 mg total) by mouth daily. (Patient taking differently: Take 40 mg by mouth as needed for fluid or edema.) 90 tablet 3 06/27/2023   metFORMIN (GLUCOPHAGE-XR) 500 MG 24 hr tablet Take 500 mg by mouth every evening.   06/27/2023   metoprolol succinate (TOPROL-XL) 100 MG 24 hr tablet Take 1 tablet (100 mg total) by mouth at bedtime. Take with or immediately following a meal. (Patient taking differently: Take 100 mg by mouth daily. Take with or immediately following a meal.) 90 tablet 3 06/28/2023 at 0700   pantoprazole (PROTONIX) 40 MG tablet Take 40 mg by mouth daily.   06/28/2023   Potassium Chloride ER 20 MEQ TBCR Take 20 mEq by mouth daily. 90 tablet 3 Past  Week   rosuvastatin (CRESTOR) 10 MG tablet Take 1 tablet (10 mg total) by mouth daily. 90 tablet 3 06/28/2023   triamcinolone cream (KENALOG) 0.1 % Apply 1 application topically every 8 (eight) hours as needed (itching).    unknown   valsartan (DIOVAN) 80 MG tablet Take 80 mg by mouth daily.   06/28/2023    Assessment: 65 yof admitted with anemia and Afib with RVR. 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 She completed an EGD and capsule endoscopy which found no active bleeding. GI cleared patient to have AC. Pharmacy consulted to begin IV heparin for Afib.   She has required 3 units PRBC since admission. Hgb low stable, platelets are low and trending down.  Goal of Therapy:  Heparin level 0.3-0.5 units/ml with low Hgb Monitor platelets by anticoagulation protocol: Yes   Plan:  Begin heparin drip at 900 units/hr with no bolus 8 hr heparin level Daily heparin level and CBC Monitor for signs/symptoms of bleeding  May transition to DOAC in next 24h  Thank you for involving pharmacy in this patient's care.  Loura Back, PharmD, BCPS Clinical Pharmacist Clinical phone for 07/04/2023 is [phone] 07/04/2023 1:02 PM

## 2023-07-04 NOTE — Assessment & Plan Note (Signed)
Patient on metformin 500 mg daily at home.  A1c 6.5. CBGs appropriate, somewhat low PO intake due to lack of hunger and planned procedures. - Hold metformin during hospitalization

## 2023-07-04 NOTE — Progress Notes (Signed)
Patient called for help due to her right arm hurting. Patient had a new PIV inserted earlier by the IV team, and heparin drip infusing as ordered. This RN noticed that patient's right arm was swollen and red/bruised above the IV insertion site. Heparin drip stopped, pharmacist and MD notified. Patient requesting for tylenol. MD notified and will be ordered. Right arm elevated with pillow. Patient refused ice to the swollen right arm IV site. RN will continue to monitor. Elnita Maxwell, RN

## 2023-07-04 NOTE — Progress Notes (Signed)
Patient removed the belt for the capsule study 1 hour prior to the time it should have been removed. Patient refused to place it back on. RN notified MD Laroy Apple to make aware.

## 2023-07-04 NOTE — Progress Notes (Signed)
Progress Note  Patient Name: Jasmin Miller MRN: [id number] DOB: March 04, 1941 Date of Encounter: 07/04/2023  Attending physician: Westley Chandler, MD Primary care provider: Lindaann Pascal, PA-C Primary Cardiologist: Dr. Truett Mainland  Subjective: Jasmin Miller is a 82 y.o. African-American female who was seen and examined at bedside  Her daughter,Shaniquia, and son-in-law present at bedside.  She provides verbal consent with regards to discussing her medical information and presence. No chest pain. Shortness of breath. GI cleared her for anticoagulation this morning. Heart rate is better controlled. Some discomfort at the left IV site.   Objective: Vital Signs in the last 24 hours: Temp:  [98.1 F (36.7 C)-98.5 F (36.9 C)] 98.1 F (36.7 C) (07/27 0733) Pulse Rate:  [70-112] 108 (07/27 0733) Resp:  [18-20] 20 (07/27 0733) BP: (106-128)/(62-91) 128/72 (07/27 0733) SpO2:  [93 %-97 %] 97 % (07/27 0733)  Intake/Output:  Intake/Output Summary (Last 24 hours) at 07/04/2023 1242 Last data filed at 07/03/2023 2120 Gross per 24 hour  Intake 181.41 ml  Output --  Net 181.41 ml    Net IO Since Admission: 2,965.3 mL [07/04/23 1242]  Weights:     07/03/2023    7:35 AM 07/03/2023    5:00 AM 07/02/2023    5:02 AM  Last 3 Weights  Weight (lbs) 139 lb 5.3 oz 139 lb 5.3 oz 138 lb  Weight (kg) 63.2 kg 63.2 kg 62.596 kg      Telemetry:  Overnight telemetry shows Afib w/ both CVR and RVR, which I personally reviewed.   Physical examination: PHYSICAL EXAM: Vitals:   07/03/23 2153 07/04/23 0003 07/04/23 0358 07/04/23 0733  BP: 114/70 (!) 124/91  128/72  Pulse: (!) 105 70  (!) 108  Resp:  18  20  Temp:  98.3 F (36.8 C) 98.5 F (36.9 C) 98.1 F (36.7 C)  TempSrc:  Oral Oral Oral  SpO2:  94%  97%  Weight:      Height:        Physical Exam  Constitutional: No distress.  Age appropriate, hemodynamically stable.   Neck: No JVD present.  Cardiovascular: Normal rate, S1 normal,  S2 normal, intact distal pulses and normal pulses. An irregularly irregular rhythm present. Exam reveals no gallop, no S3 and no S4.  Murmur heard. Systolic murmur is present with a grade of 3/6. Pulmonary/Chest: Effort normal. No stridor. She has no wheezes. She has bibasilar rales.  Abdominal: Soft. Bowel sounds are normal. She exhibits no distension. There is no abdominal tenderness.  Musculoskeletal:        General: No edema.     Cervical back: Neck supple.  Neurological: She is alert and oriented to person, place, and time. She has intact cranial nerves (2-12).  Skin: Skin is warm and moist.   Lab Results: Chemistry  Recent Labs  Lab 06/28/23 0737 06/28/23 1703 07/02/23 0053 07/03/23 0016 07/04/23 0059  NA 137   < > 138 141 140  K 2.9*   < > 3.6 4.1 3.8  CL 104   < > 107 111 111  CO2 21*   < > 20* 21* 19*  GLUCOSE 135*   < > 129* 142* 135*  BUN 18   < > 25* 22 16  CREATININE 1.25*   < > 1.39* 1.26* 1.13*  CALCIUM 8.0*   < > 7.9* 8.4* 8.5*  PROT 5.7*  --   --   --   --   ALBUMIN 3.0*  --   --   --   --  AST 18  --   --   --   --   ALT 15  --   --   --   --   ALKPHOS 69  --   --   --   --   BILITOT 0.4  --   --   --   --   GFRNONAA 43*   < > 38* 43* 49*  ANIONGAP 12   < > 11 9 10    < > = values in this interval not displayed.    Hematology Recent Labs  Lab 07/02/23 0053 07/03/23 0016 07/04/23 0059  WBC 10.3 12.4* 9.3  RBC 4.08 4.30 4.09  HGB 8.7* 9.4* 8.7*  HCT 28.8* 31.7* 29.7*  MCV 70.6* 73.7* 72.6*  MCH 21.3* 21.9* 21.3*  MCHC 30.2 29.7* 29.3*  RDW 26.1* 26.6* 27.4*  PLT 154 144* 127*   High Sensitivity Troponin:   Recent Labs  Lab 06/28/23 0737 06/28/23 0929 06/28/23 1703 06/28/23 2102 06/29/23 1206  TROPONINIHS 198* 212* 224* 235* 156*     Cardiac EnzymesNo results for input(s): "TROPONINI" in the last 168 hours. No results for input(s): "TROPIPOC" in the last 168 hours.  BNP Recent Labs  Lab 06/28/23 0737 07/04/23 1034  BNP 731.2*  2,650.6*    DDimer No results for input(s): "DDIMER" in the last 168 hours.  Hemoglobin A1c:  Lab Results  Component Value Date   HGBA1C 6.5 (H) 06/28/2023   MPG 139.85 06/28/2023   TSH  Recent Labs    06/28/23 1839  TSH 3.235   Lipid Panel  Lab Results  Component Value Date   CHOL 97 06/29/2023   HDL 45 06/29/2023   LDLCALC 42 06/29/2023   LDLDIRECT 39 06/29/2023   TRIG 51 06/29/2023   CHOLHDL 2.2 06/29/2023   Drugs of Abuse     Component Value Date/Time   LABOPIA NONE DETECTED 10/03/2015 1313   COCAINSCRNUR NONE DETECTED 10/03/2015 1313   LABBENZ NONE DETECTED 10/03/2015 1313   AMPHETMU NONE DETECTED 10/03/2015 1313   THCU NONE DETECTED 10/03/2015 1313   LABBARB NONE DETECTED 10/03/2015 1313      Imaging: No results found.  CARDIAC DATABASE: EKG: June 28, 2023: Atrial fibrillation, 127 bpm, poor R wave progression, without underlying injury pattern, frequent PVCs. July 25th, 2024: Afib w/ RVR 141bpm.   Echocardiogram: 06/29/2023  1. Left ventricular ejection fraction, by estimation, is 40 to 45%. The  left ventricle has mildly decreased function. The left ventricle  demonstrates global hypokinesis. The left ventricular internal cavity size  was mildly dilated. There is mild left  ventricular hypertrophy. Left ventricular diastolic parameters are  indeterminate.   2. Right ventricular systolic function is mildly reduced. The right  ventricular size is normal. There is mildly elevated pulmonary artery  systolic pressure. The estimated right ventricular systolic pressure is  42.9 mmHg.   3. Left atrial size was severely dilated.   4. The mitral valve is abnormal. Severe mitral valve regurgitation.   5. Tricuspid valve regurgitation is mild to moderate.   6. The aortic valve is tricuspid. Aortic valve regurgitation is not  visualized. Aortic valve sclerosis/calcification is present, without any  evidence of aortic stenosis.   7. The inferior vena cava is  dilated in size with <50% respiratory  variability, suggesting right atrial pressure of 15 mmHg.   Scheduled Meds:  digoxin  0.25 mg Intravenous Once   empagliflozin  10 mg Oral Daily   [START ON 07/05/2023] furosemide  20 mg Intravenous  Q12H   furosemide  40 mg Intravenous Once   hydrocortisone cream   Topical TID   metoprolol tartrate  100 mg Oral BID   pantoprazole  40 mg Oral Daily   rosuvastatin  10 mg Oral Daily    Continuous Infusions:    PRN Meds: mouth rinse, polyethylene glycol, triamcinolone cream   IMPRESSION & RECOMMENDATIONS: Jasmin Miller is a 82 y.o. 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   Impression: Acute HFrEF Atrial fibrillation, newly discovered Symptomatic anemia status post blood transfusion. Premature ventricular contractions. History of myocardial infarction. Hypertension. Elevated high-sensitivity troponins. Elevated BNP. Non-insulin-dependent diabetes mellitus type 2. Former smoker  Recommendations: Acute HFrEF Stage C, NYHA class II/III Secondary to underlying cardiomyopathy, A-fib with RVR on presentation, anemia needing PRBC transfusions & fluid resuscitation, and IV Drips.  Net IO Since Admission: 2,965.3 mL [07/04/23 1242] -net positive fluid balance since admission    Component Value Date/Time   BNP 2,650.6 (H) 07/04/2023 1034  Restart Lasix 40 mg x 1 IV push today followed by 20 mg IV push twice daily starting tomorrow Start Jardiance Will uptitrate GDMT once she is diuresed, blood pressures are stable, and depending on renal function. Strict I's and O's and daily weights. Further recommendations to follow.  Newly discovered atrial fibrillation: Present on arrival, duration unknown, LA severely dilated. Rate control: Lopressor 100 mg p.o. twice daily with holding  parameters & start digoxin Rhythm control: N/A. Thromboembolic prophylaxis: IV heparin, GI has cleared her for anticoagulation  Discontinue IV Cardizem drip. Digoxin load Since her rates are well-controlled we will hold off on initiation of amiodarone.  Start IV heparin for now and transition to oral medications in next 24 hours if no bleeding complications.  Recommend short course of anticoagulation for now with outpatient referral to structural heart team for Watchman evaluation.  Along with her ventricular rate and BP are well control hold off cardioversion in the setting of anemia.  CHA2DS2-VASc SCORE is 5 which correlates to 7.2% risk of stroke per year (HTN, diabetes, age, gender).  TSH within normal limits.  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  Capsule endoscopy showed 2 small areas of erythema versus small nonbleeding AVMs.  Please refer to GI progress notes and endoscopy report for additional details. GI cleared her for anticoagulation on 07/04/2023 Will start IV heparin and monitor for bleeding and H&H. If hemoglobin remains stable and no obvious evidence of bleeding may transition to oral anticoagulation in the next 24 hours. I do anticipate she will be a great candidate for anticoagulation long-term -will refer to structural heart team as outpatient for watchman evaluation. Primary team considering IV iron infusion and/or hematology consult.  Premature ventricular contractions: More noticeable on telemetry over the last 24 hours. Monitor for now.  As noted above plan of care discussed with the patient, daughter, son-in-law, nursing staff.  Patient's questions and concerns were addressed to her and her daughter's satisfaction. She and her daughter voices understanding of the instructions provided during this  encounter.   This note was created using a voice recognition software as a result there may be grammatical errors inadvertently enclosed that do not reflect the nature of this encounter. Every attempt is made to correct such errors.  Delilah Shan [facility]  Pager:  [phone] Office: [phone] 07/04/2023, 12:42 PM

## 2023-07-04 NOTE — Plan of Care (Signed)
  Problem: Education: Goal: Ability to describe self-care measures that may prevent or decrease complications (Diabetes Survival Skills Education) will improve Outcome: Progressing   Problem: Coping: Goal: Ability to adjust to condition or change in health will improve Outcome: Progressing   Problem: Fluid Volume: Goal: Ability to maintain a balanced intake and output will improve Outcome: Progressing   

## 2023-07-04 NOTE — Plan of Care (Signed)
  Problem: Education: Goal: Ability to describe self-care measures that may prevent or decrease complications (Diabetes Survival Skills Education) will improve Outcome: Progressing Goal: Individualized Educational Video(s) Outcome: Progressing   Problem: Health Behavior/Discharge Planning: Goal: Ability to manage health-related needs will improve Outcome: Progressing   Problem: Metabolic: Goal: Ability to maintain appropriate glucose levels will improve Outcome: Progressing

## 2023-07-04 NOTE — Progress Notes (Signed)
[facility] Gastroenterology Progress Note  Jasmin Miller 82 y.o. [birth year]  CC: Anemia, FOBT negative,   Subjective: Patient seen and examined at bedside.  Denies any acute GI issues.  Last bowel movement yesterday was normal color and consistency.  Denies seeing any blood in the stool or black stool.  ROS : Afebrile, negative for chest pain   Objective: Vital signs in last 24 hours: Vitals:   07/04/23 0358 07/04/23 0733  BP:  128/72  Pulse:  (!) 108  Resp:  20  Temp: 98.5 F (36.9 C) 98.1 F (36.7 C)  SpO2:  97%    Physical Exam: Resting comfortably, not in acute distress.  Abdominal exam benign.  Lab Results: Recent Labs    07/03/23 0016 07/04/23 0059  NA 141 140  K 4.1 3.8  CL 111 111  CO2 21* 19*  GLUCOSE 142* 135*  BUN 22 16  CREATININE 1.26* 1.13*  CALCIUM 8.4* 8.5*  MG 2.1 1.9   No results for input(s): "AST", "ALT", "ALKPHOS", "BILITOT", "PROT", "ALBUMIN" in the last 72 hours. Recent Labs    07/03/23 0016 07/04/23 0059  WBC 12.4* 9.3  HGB 9.4* 8.7*  HCT 31.7* 29.7*  MCV 73.7* 72.6*  PLT 144* 127*   No results for input(s): "LABPROT", "INR" in the last 72 hours.    Assessment/Plan: -Anemia with hemoglobin of 6.4 on admission on June 28, 2023.  No overt bleeding.  FOBT negative.  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  Capsule endoscopy showed 2 small areas of erythema versus small nonbleeding AVMs.  Multiple EGDs in the past.  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  -History of atrial fibrillation.  Anticoagulation currently on hold.  Recommendations ----------------------------- -Capsule endoscopy negative for any active bleeding. -Okay to resume anticoagulation from GI standpoint.  Repeat CBC in the morning.  If further drop in hemoglobin or if any evidence of overt bleeding, we may consider colonoscopy to rule out colonic AVMs. -GI will follow  Kathi Der MD,  FACP 07/04/2023, 11:17 AM  Contact #  [phone]

## 2023-07-04 NOTE — Anesthesia Postprocedure Evaluation (Signed)
Anesthesia Post Note  Patient: Jasmin Miller  Procedure(s) Performed: ESOPHAGOGASTRODUODENOSCOPY (EGD) WITH PROPOFOL (Left) GIVENS CAPSULE STUDY     Patient location during evaluation: PACU Anesthesia Type: MAC Level of consciousness: awake and alert Pain management: pain level controlled Vital Signs Assessment: post-procedure vital signs reviewed and stable Respiratory status: spontaneous breathing, nonlabored ventilation, respiratory function stable and patient connected to nasal cannula oxygen Cardiovascular status: stable and blood pressure returned to baseline Postop Assessment: no apparent nausea or vomiting Anesthetic complications: no   No notable events documented.  Last Vitals:  Vitals:   07/04/23 0003 07/04/23 0358  BP: (!) 124/91   Pulse: 70   Resp: 18   Temp: 36.8 C 36.9 C  SpO2: 94%     Last Pain:  Vitals:   07/04/23 0731  TempSrc:   PainSc: 0-No pain                 Jasmin Miller S

## 2023-07-05 DIAGNOSIS — D649 Anemia, unspecified: Secondary | ICD-10-CM | POA: Diagnosis not present

## 2023-07-05 DIAGNOSIS — I4891 Unspecified atrial fibrillation: Secondary | ICD-10-CM

## 2023-07-05 LAB — BASIC METABOLIC PANEL WITH GFR
Anion gap: 9 (ref 5–15)
BUN: 11 mg/dL (ref 8–23)
CO2: 24 mmol/L (ref 22–32)
Calcium: 8.5 mg/dL — ABNORMAL LOW (ref 8.9–10.3)
Chloride: 107 mmol/L (ref 98–111)
Creatinine, Ser: 1.03 mg/dL — ABNORMAL HIGH (ref 0.44–1.00)
GFR, Estimated: 54 mL/min — ABNORMAL LOW (ref >60)
Glucose, Bld: 125 mg/dL — ABNORMAL HIGH (ref 70–99)
Potassium: 3 mmol/L — ABNORMAL LOW (ref 3.5–5.1)
Sodium: 140 mmol/L (ref 135–145)

## 2023-07-05 LAB — BASIC METABOLIC PANEL
Anion gap: 10 (ref 5–15)
BUN: 12 mg/dL (ref 8–23)
CO2: 22 mmol/L (ref 22–32)
Calcium: 8.6 mg/dL — ABNORMAL LOW (ref 8.9–10.3)
Chloride: 109 mmol/L (ref 98–111)
Creatinine, Ser: 0.96 mg/dL (ref 0.44–1.00)
GFR, Estimated: 59 mL/min — ABNORMAL LOW (ref >60)
Glucose, Bld: 99 mg/dL (ref 70–99)
Potassium: 4.4 mmol/L (ref 3.5–5.1)
Sodium: 141 mmol/L (ref 135–145)

## 2023-07-05 LAB — CBC
HCT: 30.7 % — ABNORMAL LOW (ref 36.0–46.0)
Hemoglobin: 9 g/dL — ABNORMAL LOW (ref 12.0–15.0)
MCH: 21.3 pg — ABNORMAL LOW (ref 26.0–34.0)
MCHC: 29.3 g/dL — ABNORMAL LOW (ref 30.0–36.0)
MCV: 72.7 fL — ABNORMAL LOW (ref 80.0–100.0)
Platelets: 151 10*3/uL (ref 150–400)
RBC: 4.22 MIL/uL (ref 3.87–5.11)
RDW: 28 % — ABNORMAL HIGH (ref 11.5–15.5)
WBC: 13.6 10*3/uL — ABNORMAL HIGH (ref 4.0–10.5)
nRBC: 0 % (ref 0.0–0.2)

## 2023-07-05 LAB — MAGNESIUM
Magnesium: 1.9 mg/dL (ref 1.7–2.4)
Magnesium: 1.9 mg/dL (ref 1.7–2.4)

## 2023-07-05 LAB — GLUCOSE, CAPILLARY: Glucose-Capillary: 103 mg/dL — ABNORMAL HIGH (ref 70–99)

## 2023-07-05 MED ORDER — MAGNESIUM SULFATE IN D5W 1-5 GM/100ML-% IV SOLN
1.0000 g | Freq: Once | INTRAVENOUS | Status: DC
  Filled 2023-07-05: qty 100

## 2023-07-05 MED ORDER — POTASSIUM CHLORIDE 20 MEQ PO PACK: 20.0000 meq | PACK | Freq: Once | ORAL | Status: DC

## 2023-07-05 MED ORDER — APIXABAN 5 MG PO TABS: 5.0000 mg | ORAL_TABLET | Freq: Two times a day (BID) | ORAL | Status: DC

## 2023-07-05 MED ORDER — SODIUM CHLORIDE 0.9% FLUSH: 10.0000 mL | INTRAVENOUS | Status: DC | PRN

## 2023-07-05 MED ORDER — SODIUM CHLORIDE 0.9% FLUSH
10.0000 mL | Freq: Two times a day (BID) | INTRAVENOUS | Status: DC
  Administered 2023-07-05 – 2023-07-07 (×4): 10 mL

## 2023-07-05 MED ORDER — POTASSIUM CHLORIDE CRYS ER 20 MEQ PO TBCR
40.0000 meq | EXTENDED_RELEASE_TABLET | Freq: Once | ORAL | Status: AC
  Administered 2023-07-05: 40 meq via ORAL
  Filled 2023-07-05: qty 2

## 2023-07-05 MED ORDER — FUROSEMIDE 40 MG PO TABS
40.0000 mg | ORAL_TABLET | Freq: Every day | ORAL | Status: DC
  Administered 2023-07-05 – 2023-07-07 (×3): 40 mg via ORAL
  Filled 2023-07-05 (×3): qty 1

## 2023-07-05 MED ORDER — LOSARTAN POTASSIUM 25 MG PO TABS
12.5000 mg | ORAL_TABLET | Freq: Every day | ORAL | Status: DC
  Administered 2023-07-05: 12.5 mg via ORAL
  Filled 2023-07-05: qty 1

## 2023-07-05 MED ORDER — DIGOXIN 125 MCG PO TABS
0.1250 mg | ORAL_TABLET | Freq: Every day | ORAL | Status: DC
  Administered 2023-07-05: 0.125 mg via ORAL
  Filled 2023-07-05: qty 1

## 2023-07-05 MED ORDER — POTASSIUM CHLORIDE 20 MEQ PO PACK: 40.0000 meq | PACK | Freq: Once | ORAL | Status: DC

## 2023-07-05 MED ORDER — VITAMIN B-12 1000 MCG PO TABS
1000.0000 ug | ORAL_TABLET | Freq: Every day | ORAL | Status: DC
  Administered 2023-07-05 – 2023-07-07 (×3): 1000 ug via ORAL
  Filled 2023-07-05 (×3): qty 1

## 2023-07-05 MED ORDER — APIXABAN 2.5 MG PO TABS
2.5000 mg | ORAL_TABLET | Freq: Two times a day (BID) | ORAL | Status: DC
  Administered 2023-07-05 – 2023-07-07 (×5): 2.5 mg via ORAL
  Filled 2023-07-05 (×5): qty 1

## 2023-07-05 MED ORDER — SPIRONOLACTONE 12.5 MG HALF TABLET
12.5000 mg | ORAL_TABLET | Freq: Every morning | ORAL | Status: DC
  Administered 2023-07-06 – 2023-07-07 (×2): 12.5 mg via ORAL
  Filled 2023-07-05 (×2): qty 1

## 2023-07-05 NOTE — Plan of Care (Signed)
  Problem: Education: Goal: Ability to describe self-care measures that may prevent or decrease complications (Diabetes Survival Skills Education) will improve 07/05/2023 1900 by Zada Finders, RN Outcome: Progressing 07/05/2023 1900 by Zada Finders, RN Outcome: Progressing Goal: Individualized Educational Video(s) 07/05/2023 1900 by Zada Finders, RN Outcome: Progressing 07/05/2023 1900 by Zada Finders, RN Outcome: Progressing   Problem: Coping: Goal: Ability to adjust to condition or change in health will improve Outcome: Progressing

## 2023-07-05 NOTE — Assessment & Plan Note (Addendum)
Pulse rate continues to fluctuate but remains tachycardic this morning.  Patient was previously on cardizem drip, but has not received anything IV since IV failed and midline was unsuccessful. - Cardiology (Dr. Odis Hollingshead) consulted, appreciate recs - Started on IV digoxin per Cardiology, will continue pending cards recs - Transition from heparin to Eliquis today for a short course and then outpatient referral to structural heart team for Watchman evaluation - Continue metoprolol tartrate 100mg  BID - AM CBC/BMP  - Electrolyte goals of K> 4, Mg> 2; replete as necessary - Consider starting amiodarone if necessary

## 2023-07-05 NOTE — Progress Notes (Signed)
There was order for Midline insertion. Talked patient's RN regarding this matter. [facility] RN stated that MD switched pill form of anticoagulation medications. [facility] RN will put in the consult if MD still wants Midline placement. Explained patient regarding this matter as well. HS [employer]

## 2023-07-05 NOTE — Progress Notes (Signed)

## 2023-07-05 NOTE — Plan of Care (Signed)
  Problem: Education: Goal: Ability to describe self-care measures that may prevent or decrease complications (Diabetes Survival Skills Education) will improve Outcome: Progressing Goal: Individualized Educational Video(s) Outcome: Progressing   Problem: Coping: Goal: Ability to adjust to condition or change in health will improve Outcome: Progressing   

## 2023-07-05 NOTE — Progress Notes (Addendum)
Went to bedside after paged by RN for patient's HR jumping into 120s about an hour ago. Reviewed EKG which showed an irregularly irregular rhythm with a rate ~110 with multiple PVCs.   At bedside patient's HR in 110s. She is resting comfortably in no acute distress. IV team currently at bedside to establish midline access. Currently she is on digoxin 0.125 mg and Lopressor 100 mg BID for rate control.   I reached back out to Dr. Odis Hollingshead to review the EKG. For now will continue with current medications.   Addendum 6:27 PM:  Spoke with Dr. Odis Hollingshead via secure chat to discuss rate controlling therapy. He reports that as long as her rates are on average in the 110s we should continue current therapy. If she goes into RVR will need to discontinue digoxin and start load with amiodarone. Per Dr. Odis Hollingshead we can load her with IV or PO pending her access.   Per IV team we cannot run amiodarone through the midline d/t risk of delayed recognition of infiltration. Will recommend night team start PO amiodarone 400 mg BID if she begins to have a fib with RVR.   Glendale Chard, DO [facility], PGY-2 07/05/23 6:19 PM

## 2023-07-05 NOTE — Progress Notes (Signed)
[facility] Gastroenterology Progress Note  Araiah Krenz 82 y.o. 1941-11-27  CC: Anemia, FOBT negative,   Subjective: Patient seen and examined at bedside.  Denies any acute GI issues.  Denies any bleeding episodes.  ROS : Afebrile, negative for chest pain   Objective: Vital signs in last 24 hours: Vitals:   07/05/23 0857 07/05/23 1146  BP: 137/70 (!) 147/60  Pulse: 95 (!) 56  Resp:  18  Temp:  99.9 F (37.7 C)  SpO2:  95%    Physical Exam: Resting comfortably, not in acute distress.  Abdominal exam benign.  Lab Results: Recent Labs    07/05/23 0103 07/05/23 1123  NA 140 141  K 3.0* 4.4  CL 107 109  CO2 24 22  GLUCOSE 125* 99  BUN 11 12  CREATININE 1.03* 0.96  CALCIUM 8.5* 8.6*  MG 1.9 1.9   No results for input(s): "AST", "ALT", "ALKPHOS", "BILITOT", "PROT", "ALBUMIN" in the last 72 hours. Recent Labs    07/04/23 0059 07/05/23 0103  WBC 9.3 13.6*  HGB 8.7* 9.0*  HCT 29.7* 30.7*  MCV 72.6* 72.7*  PLT 127* 151   No results for input(s): "LABPROT", "INR" in the last 72 hours.    Assessment/Plan: -Anemia with hemoglobin of 6.4 on admission on June 28, 2023.  No overt bleeding.  FOBT negative.  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  Capsule endoscopy showed 2 small areas of erythema versus small nonbleeding AVMs.  Multiple EGDs in the past.  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  -History of atrial fibrillation.  Anticoagulation resumed yesterday.  Recommendations ----------------------------- -Capsule endoscopy negative for any active bleeding. -Hemoglobin stable, may have some hemoconcentration today. -No bleeding while on heparin drip.  Okay to switch anticoagulation to oral. -No further inpatient GI workup planned.  GI will sign off.  Call us back if needed.   Kathi Der MD, FACP 07/05/2023, 12:16 PM  Contact #  [phone]

## 2023-07-05 NOTE — Assessment & Plan Note (Addendum)
EF 40-45%. Endorses SOB chronically but worse today likely d/t being off Lasix and has some trace pitting edema today. - Restart home Lasix 40 mg daily - Jardiance 10 mg daily - Add on GDMT once appropriately stable BP and adequate diuresis - Strict I&Os - Daily weights - Cardiology on board, appreciate recs

## 2023-07-05 NOTE — Assessment & Plan Note (Signed)
Patient on metformin 500 mg daily at home.  A1c 6.5. CBGs appropriate, somewhat low PO intake due to lack of hunger. - Holding metformin during hospitalization

## 2023-07-05 NOTE — Progress Notes (Addendum)
RN modified midline order @2053  to have it placed urgently to start heparin infusion. Lisa from IV team stated that they were backed up and will come as soon as they can. Patient denies any complaints at this time and awaiting midline placement. Pharmacy and MD aware of pending midline insertion.

## 2023-07-05 NOTE — Progress Notes (Signed)
Daily Progress Note Intern Pager: [phone]  Patient name: Jasmin Miller Medical record number: [id number] Date of birth: 02-Nov-1941 Age: 82 y.o. Gender: female  Primary Care Provider: Lindaann Pascal, PA-C Consultants: Cardiology, GI Code Status: Full Code  Pt Overview and Major Events to Date:  7/21: Admitted to FMTS, received 2U PRBCs 7/24: Received 1U PRBCs 7/26: Endoscopy & Capsule Endoscopy 7/27: Heparin + Digoxin 7/28: Eliquis  Assessment and Plan: Jasmin Miller is a 82 y.o. female who presented with complaint of a rash, then found to be tachycardic and with new onset Afib with RVR.  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  [facility]     * (Principal) Atrial fibrillation with rapid ventricular response (HCC)      Pulse rate continues to fluctuate but remains tachycardic this morning.   Patient was previously on cardizem drip, but has not received anything IV  since IV failed and midline was unsuccessful. - Cardiology (Dr. Odis Hollingshead) consulted, appreciate recs - Started on IV digoxin per Cardiology, will continue pending cards recs - Transition from heparin to Eliquis today for a short course and then  outpatient referral to structural heart team for Watchman evaluation - Continue metoprolol tartrate 100mg  BID - AM CBC/BMP  - Electrolyte goals of K> 4, Mg> 2; replete as necessary - Consider starting amiodarone if necessary        AKI (acute kidney injury) (HCC)     Creatinine 1.03.  Baseline somewhat unclear, appears to be somewhere  around 1.  Complicated in the setting of anemia and A-fib. - BMP at noon - Avoid nephrotoxic drugs        Anemia     Hemoglobin 9.0 this morning.  This is a chronic problem likely due to  chronic GI blood loss. Endoscopy showed single nonbleeding duodenal AVM.  Capsule endoscopy showed 2 small areas of erythema vs small  nonbleeding  AVMs. S/p 500 mg IV Venofer 7/25. - GI on board, appreciate recs - Repeat CBC in AM, consider additional transfusions as needed        Acute HFrEF (heart failure with reduced ejection fraction) (HCC)     EF 40-45%. Endorses SOB chronically but worse today likely d/t being  off Lasix and has some trace pitting edema today. - Restart home Lasix 40 mg daily - Jardiance 10 mg daily - Add on GDMT once appropriately stable BP and adequate diuresis - Strict I&Os - Daily weights - Cardiology on board, appreciate recs      Chronic Stable Conditions: Vitamin B12 deficiency: replete with Vit B12 1000 mcg daily T2DM: On Metformin 500 mg daily at home. A1c 6.5. CBGs appropriate, holding home metformin during hospitalization.  HLD: Continue home rosuvastatin 10 mg daily GERD: Protonix 40 mg daily Chronic constipation: MiraLAX daily as needed Postinflammatory hyperpigmentation: Continue Kenalog cream as needed  FEN/GI: Heart Healthy PPx:  Eliquis Dispo: therapeutic on Eliquis  Subjective:  Patient sitting comfortably on side of her bed this morning eating breakfast.  Complains of no chest pain or shortness of breath but shares that shortness of breath is common for her especially with activities.  Nursing staff at bedside reports that her midline could not be placed early this morning and she has been off of her heparin drip since 4 PM yesterday.  She has been unable to receive her IV Lasix or any repletion of  magnesium.  Objective: Temp:  [97.9 F (36.6 C)-99.4 F (37.4 C)] 99.4 F (37.4 C) (07/28 0706) Pulse Rate:  [71-120] 95 (07/28 0857) Resp:  [18-20] 18 (07/28 0706) BP: (130-160)/(50-79) 137/70 (07/28 0857) SpO2:  [93 %-98 %] 94 % (07/28 0409) Weight:  [60.2 kg] 60.2 kg (07/28 0425) Physical Exam: General: NAD, awake and alert Cardiovascular: Irregularly irregular rhythm, 3/6 systolic murmur Respiratory: CTAB. No crackles, wheezing, or rhonchi noted Abdomen: Soft,  non-tender, non-distended. Normoactive bowel sounds. Extremities: trace bilateral lower extremity edema  Laboratory: Most recent CBC Lab Results  Component Value Date   WBC 13.6 (H) 07/05/2023   HGB 9.0 (L) 07/05/2023   HCT 30.7 (L) 07/05/2023   MCV 72.7 (L) 07/05/2023   PLT 151 07/05/2023   Most recent BMP    Latest Ref Rng & Units 07/05/2023    1:03 AM  BMP  Glucose 70 - 99 mg/dL 562   BUN 8 - 23 mg/dL 11   Creatinine 1.30 - 1.00 mg/dL 8.65   Sodium 784 - 696 mmol/L 140   Potassium 3.5 - 5.1 mmol/L 3.0   Chloride 98 - 111 mmol/L 107   CO2 22 - 32 mmol/L 24   Calcium 8.9 - 10.3 mg/dL 8.5     BNP: 2952.8  Imaging/Diagnostic Tests: No new imaging.   Fortunato Curling, DO 07/05/2023, 10:50 AM PGY-1, [facility]  FPTS Intern pager: [phone], text pages welcome Secure chat group [facility]

## 2023-07-05 NOTE — Assessment & Plan Note (Addendum)
Hemoglobin 9.0 this morning.  This is a chronic problem likely due to chronic GI blood loss. Endoscopy showed single nonbleeding duodenal AVM. Capsule endoscopy showed 2 small areas of erythema vs small nonbleeding AVMs. S/p 500 mg IV Venofer 7/25. - GI on board, appreciate recs - Repeat CBC in AM, consider additional transfusions as needed

## 2023-07-05 NOTE — Progress Notes (Signed)
IV Team arrived at 0530 to place midline and was unsuccessful. They will refer to dayshift IV team. Medications pending to give are Lasix IV, Magnesium, and Heparin gtt to be started. RN reported to dayshift RN

## 2023-07-05 NOTE — Plan of Care (Signed)
  Problem: Education: Goal: Knowledge of disease or condition will improve Outcome: Progressing Goal: Understanding of medication regimen will improve Outcome: Progressing   Problem: Activity: Goal: Ability to tolerate increased activity will improve Outcome: Progressing   Problem: Coping: Goal: Level of anxiety will decrease Outcome: Progressing   Problem: Safety: Goal: Ability to remain free from injury will improve Outcome: Progressing   Problem: Skin Integrity: Goal: Risk for impaired skin integrity will decrease Outcome: Progressing

## 2023-07-05 NOTE — Progress Notes (Signed)
At bedside for midline insertion. Pt refused stating, " I do not want foreign objects in my body in case I may need something." Will secure chat RN and MD to make aware of status.

## 2023-07-05 NOTE — Assessment & Plan Note (Signed)
Creatinine 1.03.  Baseline somewhat unclear, appears to be somewhere around 1.  Complicated in the setting of anemia and A-fib. - BMP at noon - Avoid nephrotoxic drugs

## 2023-07-05 NOTE — Progress Notes (Signed)
Progress Note  Patient Name: Jasmin Miller MRN: [id number] DOB: 20-Dec-1940 Date of Encounter: 07/05/2023  Attending physician: Westley Chandler, MD Primary care provider: Lindaann Pascal, PA-C Primary Cardiologist: Dr. Truett Mainland  Subjective: Jasmin Miller is a 82 y.o. African-American female who was seen and examined at bedside  Sitting up in bed. Denies chest pain or shortness of breath. Breathing is much improved since yesterday. Had trouble with IV yesterday night and now has been transitioned to oral anticoagulation No family at bedside  Objective: Vital Signs in the last 24 hours: Temp:  [97.9 F (36.6 C)-99.9 F (37.7 C)] 99.9 F (37.7 C) (07/28 1146) Pulse Rate:  [56-120] 56 (07/28 1146) Resp:  [18-20] 18 (07/28 1146) BP: (130-160)/(50-79) 147/60 (07/28 1146) SpO2:  [93 %-96 %] 95 % (07/28 1146) Weight:  [60.2 kg] 60.2 kg (07/28 0425)  Intake/Output:  Intake/Output Summary (Last 24 hours) at 07/05/2023 1442 Last data filed at 07/05/2023 1300 Gross per 24 hour  Intake 480 ml  Output 3600 ml  Net -3120 ml    Net IO Since Admission: -34.7 mL [07/05/23 1442]   Weights:     07/05/2023    4:25 AM 07/03/2023    7:35 AM 07/03/2023    5:00 AM  Last 3 Weights  Weight (lbs) 132 lb 11.5 oz 139 lb 5.3 oz 139 lb 5.3 oz  Weight (kg) 60.2 kg 63.2 kg 63.2 kg      Telemetry:  Overnight telemetry shows Afib w/ both CVR and RVR, which I personally reviewed.   Physical examination: PHYSICAL EXAM: Vitals:   07/05/23 0425 07/05/23 0706 07/05/23 0857 07/05/23 1146  BP:  (!) 143/76 137/70 (!) 147/60  Pulse:  (!) 108 95 (!) 56  Resp:  18  18  Temp:  99.4 F (37.4 C)  99.9 F (37.7 C)  TempSrc:  Oral  Oral  SpO2:    95%  Weight: 60.2 kg     Height:        Physical Exam  Constitutional: No distress.  Age appropriate, hemodynamically stable.   Neck: No JVD present.  Cardiovascular: Normal rate, S1 normal, S2 normal, intact distal pulses and normal pulses. An  irregularly irregular rhythm present. Exam reveals no gallop, no S3 and no S4.  Murmur heard. Systolic murmur is present with a grade of 3/6. Pulmonary/Chest: Effort normal. No stridor. She has no wheezes. She has bibasilar rales.  Abdominal: Soft. Bowel sounds are normal. She exhibits no distension. There is no abdominal tenderness.  Musculoskeletal:        General: No edema.     Cervical back: Neck supple.  Neurological: She is alert and oriented to person, place, and time. She has intact cranial nerves (2-12).  Skin: Skin is warm and moist.   Lab Results: Chemistry  Recent Labs  Lab 07/04/23 0059 07/05/23 0103 07/05/23 1123  NA 140 140 141  K 3.8 3.0* 4.4  CL 111 107 109  CO2 19* 24 22  GLUCOSE 135* 125* 99  BUN 16 11 12   CREATININE 1.13* 1.03* 0.96  CALCIUM 8.5* 8.5* 8.6*  GFRNONAA 49* 54* 59*  ANIONGAP 10 9 10     Hematology Recent Labs  Lab 07/03/23 0016 07/04/23 0059 07/05/23 0103  WBC 12.4* 9.3 13.6*  RBC 4.30 4.09 4.22  HGB 9.4* 8.7* 9.0*  HCT 31.7* 29.7* 30.7*  MCV 73.7* 72.6* 72.7*  MCH 21.9* 21.3* 21.3*  MCHC 29.7* 29.3* 29.3*  RDW 26.6* 27.4* 28.0*  PLT 144* 127* 151  High Sensitivity Troponin:   Recent Labs  Lab 06/28/23 0737 06/28/23 0929 06/28/23 1703 06/28/23 2102 06/29/23 1206  TROPONINIHS 198* 212* 224* 235* 156*     Cardiac EnzymesNo results for input(s): "TROPONINI" in the last 168 hours. No results for input(s): "TROPIPOC" in the last 168 hours.  BNP Recent Labs  Lab 07/04/23 1034  BNP 2,650.6*    DDimer No results for input(s): "DDIMER" in the last 168 hours.  Hemoglobin A1c:  Lab Results  Component Value Date   HGBA1C 6.5 (H) 06/28/2023   MPG 139.85 06/28/2023   TSH  Recent Labs    06/28/23 1839  TSH 3.235   Lipid Panel  Lab Results  Component Value Date   CHOL 97 06/29/2023   HDL 45 06/29/2023   LDLCALC 42 06/29/2023   LDLDIRECT 39 06/29/2023   TRIG 51 06/29/2023   CHOLHDL 2.2 06/29/2023   Drugs of Abuse      Component Value Date/Time   LABOPIA NONE DETECTED 10/03/2015 1313   COCAINSCRNUR NONE DETECTED 10/03/2015 1313   LABBENZ NONE DETECTED 10/03/2015 1313   AMPHETMU NONE DETECTED 10/03/2015 1313   THCU NONE DETECTED 10/03/2015 1313   LABBARB NONE DETECTED 10/03/2015 1313      Imaging: No results found.  CARDIAC DATABASE: EKG: June 28, 2023: Atrial fibrillation, 127 bpm, poor R wave progression, without underlying injury pattern, frequent PVCs. July 25th, 2024: Afib w/ RVR 141bpm.   Echocardiogram: 06/29/2023  1. Left ventricular ejection fraction, by estimation, is 40 to 45%. The  left ventricle has mildly decreased function. The left ventricle  demonstrates global hypokinesis. The left ventricular internal cavity size  was mildly dilated. There is mild left  ventricular hypertrophy. Left ventricular diastolic parameters are  indeterminate.   2. Right ventricular systolic function is mildly reduced. The right  ventricular size is normal. There is mildly elevated pulmonary artery  systolic pressure. The estimated right ventricular systolic pressure is  42.9 mmHg.   3. Left atrial size was severely dilated.   4. The mitral valve is abnormal. Severe mitral valve regurgitation.   5. Tricuspid valve regurgitation is mild to moderate.   6. The aortic valve is tricuspid. Aortic valve regurgitation is not  visualized. Aortic valve sclerosis/calcification is present, without any  evidence of aortic stenosis.   7. The inferior vena cava is dilated in size with <50% respiratory  variability, suggesting right atrial pressure of 15 mmHg.   Scheduled Meds:  apixaban  2.5 mg Oral BID   vitamin B-12  1,000 mcg Oral Daily   digoxin  0.125 mg Oral Daily   empagliflozin  10 mg Oral Daily   furosemide  40 mg Oral Daily   hydrocortisone cream   Topical TID   losartan  12.5 mg Oral Daily   metoprolol tartrate  100 mg Oral BID   pantoprazole  40 mg Oral Daily   potassium chloride  20 mEq  Oral Once   rosuvastatin  10 mg Oral Daily   [START ON 07/06/2023] spironolactone  12.5 mg Oral q AM    Continuous Infusions:  magnesium sulfate bolus IVPB       PRN Meds: acetaminophen, mouth rinse, polyethylene glycol, triamcinolone cream   IMPRESSION & RECOMMENDATIONS: Jasmin Miller is a 82 y.o. 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   Impression: Acute HFrEF Atrial fibrillation, newly discovered Symptomatic anemia status post  blood transfusion. Premature ventricular contractions. History of myocardial infarction. Hypertension. Elevated high-sensitivity troponins. Elevated BNP. Non-insulin-dependent diabetes mellitus type 2. Former smoker  Recommendations: Acute HFrEF Stage C, NYHA class II/III Secondary to underlying cardiomyopathy, A-fib with RVR on presentation, anemia needing PRBC transfusions & fluid resuscitation, and IV Drips.  Net IO Since Admission: -34.7 mL [07/05/23 1442], urine output 3600 cc since yesterday Diuresing well with IV Lasix. Agree with reducing Lasix to daily. Continue SGLT2 inhibitors. Will start low-dose losartan 12.5 mg p.o. every afternoon and spironolactone 12.5 mg p.o. every morning Continue atrial fibrillation management as discussed below. Strict I's and O's and daily weights.  Newly discovered atrial fibrillation: Present on arrival, duration unknown, LA severely dilated. Did well with IV digoxin yesterday. Rate control: Lopressor and digoxin. Rhythm control: N/A. Thromboembolic prophylaxis transitioned from IV heparin to Eliquis today, July 05, 2023 Would recommend anticoagulation for at least 4 weeks to see if she can tolerate the medication and to reassure her that she is not having recurrence of bleeding.  Thereafter can be scheduled for direct-current  cardioversion.  Until then would recommend rate control strategy. Click Here to Calculate/Change CHADS2VASc Score The patient's CHADS2-VASc score is 6, indicating a 9.7% annual risk of stroke.  CHF History: Yes HTN History: Yes Diabetes History: Yes Stroke History: No Vascular Disease History: No Given consider outpatient evaluation for watchman implantation. TSH within normal limits.  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  Capsule endoscopy showed 2 small areas of erythema versus small nonbleeding AVMs.  Please refer to GI progress notes and endoscopy report for additional details. GI cleared her for anticoagulation on 07/04/2023 Started on IV heparin drip 07/04/2023, patient does not endorse evidence of bleeding, and morning hemoglobin 9 g/dL  Transitioned to Eliquis to 07/05/2023  Primary team considering IV iron infusion and/or hematology consult.  Premature ventricular contractions: Improved. Continue current medical therapy  Anticipate discharge in the next 24 to 48 hours depending on how she does clinically, hemoglobin levels, and rate control.  Strongly recommended that she follows up with her primary cardiologist Dr. Truett Mainland upon discharge for reasons mentioned above and also to consider ischemic workup given the reduced LVEF.  Patient verbalizes understanding and is thankful for the care provided.  Patient's questions and concerns were addressed to her and her daughter's satisfaction. She and her daughter voices understanding of the instructions provided during this encounter.   This note was created using a voice recognition software as a result there may be grammatical errors inadvertently enclosed that do not reflect the nature of this encounter. Every attempt is made to correct such errors.  Delilah Shan [facility]  Pager:  [phone] Office: [phone] 07/05/2023, 2:42 PM

## 2023-07-06 ENCOUNTER — Encounter (HOSPITAL_COMMUNITY): Payer: 59

## 2023-07-06 ENCOUNTER — Encounter: Payer: Self-pay | Admitting: Hematology and Oncology

## 2023-07-06 ENCOUNTER — Encounter (HOSPITAL_COMMUNITY): Payer: Self-pay | Admitting: Gastroenterology

## 2023-07-06 ENCOUNTER — Other Ambulatory Visit (HOSPITAL_COMMUNITY): Payer: Self-pay

## 2023-07-06 DIAGNOSIS — I4891 Unspecified atrial fibrillation: Secondary | ICD-10-CM | POA: Diagnosis not present

## 2023-07-06 LAB — GLUCOSE, CAPILLARY
Glucose-Capillary: 90 mg/dL (ref 70–99)
Glucose-Capillary: 121 mg/dL — ABNORMAL HIGH (ref 70–99)

## 2023-07-06 LAB — BASIC METABOLIC PANEL
Anion gap: 11 (ref 5–15)
BUN: 12 mg/dL (ref 8–23)
CO2: 24 mmol/L (ref 22–32)
Calcium: 8.3 mg/dL — ABNORMAL LOW (ref 8.9–10.3)
Chloride: 104 mmol/L (ref 98–111)
Creatinine, Ser: 1.15 mg/dL — ABNORMAL HIGH (ref 0.44–1.00)
GFR, Estimated: 48 mL/min — ABNORMAL LOW (ref >60)
Glucose, Bld: 157 mg/dL — ABNORMAL HIGH (ref 70–99)
Potassium: 3.5 mmol/L (ref 3.5–5.1)
Sodium: 139 mmol/L (ref 135–145)

## 2023-07-06 LAB — MAGNESIUM: Magnesium: 1.9 mg/dL (ref 1.7–2.4)

## 2023-07-06 MED ORDER — LOSARTAN POTASSIUM 25 MG PO TABS
25.0000 mg | ORAL_TABLET | Freq: Every day | ORAL | 0 refills | Status: DC
  Filled 2023-07-06: qty 30, 30d supply, fill #0

## 2023-07-06 MED ORDER — POTASSIUM CHLORIDE CRYS ER 20 MEQ PO TBCR
40.0000 meq | EXTENDED_RELEASE_TABLET | Freq: Once | ORAL | Status: AC
  Administered 2023-07-06: 40 meq via ORAL
  Filled 2023-07-06: qty 2

## 2023-07-06 MED ORDER — MAGNESIUM SULFATE IN D5W 1-5 GM/100ML-% IV SOLN
1.0000 g | Freq: Once | INTRAVENOUS | Status: AC
  Administered 2023-07-06: 1 g via INTRAVENOUS
  Filled 2023-07-06: qty 100

## 2023-07-06 MED ORDER — MAGNESIUM SULFATE 2 GM/50ML IV SOLN
2.0000 g | Freq: Once | INTRAVENOUS | Status: AC
  Administered 2023-07-06: 2 g via INTRAVENOUS
  Filled 2023-07-06: qty 50

## 2023-07-06 MED ORDER — DILTIAZEM HCL ER COATED BEADS 180 MG PO CP24
180.0000 mg | ORAL_CAPSULE | Freq: Every day | ORAL | Status: DC
  Administered 2023-07-06 – 2023-07-07 (×2): 180 mg via ORAL
  Filled 2023-07-06 (×2): qty 1

## 2023-07-06 MED ORDER — METOPROLOL TARTRATE 100 MG PO TABS
100.0000 mg | ORAL_TABLET | Freq: Two times a day (BID) | ORAL | 0 refills | Status: DC
  Filled 2023-07-06: qty 60, 30d supply, fill #0

## 2023-07-06 MED ORDER — SPIRONOLACTONE 25 MG PO TABS
12.5000 mg | ORAL_TABLET | Freq: Every morning | ORAL | 0 refills | Status: DC
  Filled 2023-07-06: qty 15, 30d supply, fill #0

## 2023-07-06 MED ORDER — EMPAGLIFLOZIN 10 MG PO TABS
10.0000 mg | ORAL_TABLET | Freq: Every day | ORAL | 0 refills | Status: DC
  Filled 2023-07-06: qty 30, 30d supply, fill #0

## 2023-07-06 MED ORDER — LOSARTAN POTASSIUM 25 MG PO TABS
25.0000 mg | ORAL_TABLET | Freq: Every day | ORAL | Status: DC
  Administered 2023-07-06 – 2023-07-07 (×2): 25 mg via ORAL
  Filled 2023-07-06 (×2): qty 1

## 2023-07-06 MED ORDER — CYANOCOBALAMIN 1000 MCG PO TABS
1000.0000 ug | ORAL_TABLET | Freq: Every day | ORAL | 0 refills | Status: DC
  Filled 2023-07-06: qty 30, 30d supply, fill #0

## 2023-07-06 MED ORDER — DILTIAZEM HCL ER COATED BEADS 180 MG PO CP24
180.0000 mg | ORAL_CAPSULE | Freq: Every day | ORAL | 0 refills | Status: DC
  Filled 2023-07-06: qty 30, 30d supply, fill #0

## 2023-07-06 MED ORDER — IRON SUCROSE 500 MG IVPB - SIMPLE MED
500.0000 mg | Freq: Once | INTRAVENOUS | Status: AC
  Administered 2023-07-06: 500 mg via INTRAVENOUS
  Filled 2023-07-06: qty 275

## 2023-07-06 MED ORDER — POTASSIUM CHLORIDE CRYS ER 20 MEQ PO TBCR
20.0000 meq | EXTENDED_RELEASE_TABLET | Freq: Once | ORAL | Status: AC
  Administered 2023-07-06: 20 meq via ORAL
  Filled 2023-07-06: qty 1

## 2023-07-06 MED ORDER — APIXABAN 2.5 MG PO TABS
2.5000 mg | ORAL_TABLET | Freq: Two times a day (BID) | ORAL | 0 refills | Status: DC
  Filled 2023-07-06: qty 60, 30d supply, fill #0

## 2023-07-06 NOTE — Plan of Care (Signed)
  Problem: Education: Goal: Ability to describe self-care measures that may prevent or decrease complications (Diabetes Survival Skills Education) will improve Outcome: Progressing Goal: Individualized Educational Video(s) Outcome: Progressing   Problem: Coping: Goal: Ability to adjust to condition or change in health will improve Outcome: Progressing   

## 2023-07-06 NOTE — TOC Benefit Eligibility Note (Signed)
Pharmacy Patient Advocate Encounter  Insurance verification completed.    The patient is insured through [facility]    Ran test claim for Jardiance and the current 30 day co-pay is 0.00.   This test claim was processed through [facility]- copay amounts may vary at other pharmacies due to pharmacy/plan contracts, or as the patient moves through the different stages of their insurance plan.

## 2023-07-06 NOTE — Assessment & Plan Note (Signed)
EF 40-45%. Endorses SOB chronically but reports she is okay this AM.  -  continue Lasix 40 mg daily - Jardiance 10 mg daily - started spironolactone 12.5 mg AM and lostartan 12.5 mg PO every afternoon, per cardiology recs  - Strict I&Os - Daily weights - Cardiology on board, appreciate recs

## 2023-07-06 NOTE — Plan of Care (Signed)
Problem: Education: Goal: Ability to describe self-care measures that may prevent or decrease complications (Diabetes Survival Skills Education) will improve Outcome: Progressing Goal: Individualized Educational Video(s) Outcome: Progressing   Problem: Coping: Goal: Ability to adjust to condition or change in health will improve Outcome: Progressing   Problem: Fluid Volume: Goal: Ability to maintain a balanced intake and output will improve Outcome: Progressing   Problem: Health Behavior/Discharge Planning: Goal: Ability to identify and utilize available resources and services will improve Outcome: Progressing Goal: Ability to manage health-related needs will improve Outcome: Progressing   Problem: Metabolic: Goal: Ability to maintain appropriate glucose levels will improve Outcome: Progressing   Problem: Nutritional: Goal: Maintenance of adequate nutrition will improve Outcome: Progressing Goal: Progress toward achieving an optimal weight will improve Outcome: Progressing   Problem: Skin Integrity: Goal: Risk for impaired skin integrity will decrease Outcome: Progressing   Problem: Tissue Perfusion: Goal: Adequacy of tissue perfusion will improve Outcome: Progressing   Problem: Education: Goal: Knowledge of General Education information will improve Description: Including pain rating scale, medication(s)/side effects and non-pharmacologic comfort measures Outcome: Progressing   Problem: Health Behavior/Discharge Planning: Goal: Ability to manage health-related needs will improve Outcome: Progressing   Problem: Clinical Measurements: Goal: Ability to maintain clinical measurements within normal limits will improve Outcome: Progressing Goal: Will remain free from infection Outcome: Progressing Goal: Diagnostic test results will improve Outcome: Progressing Goal: Respiratory complications will improve Outcome: Progressing Goal: Cardiovascular complication will  be avoided Outcome: Progressing   Problem: Activity: Goal: Risk for activity intolerance will decrease Outcome: Progressing   Problem: Nutrition: Goal: Adequate nutrition will be maintained Outcome: Progressing   Problem: Coping: Goal: Level of anxiety will decrease Outcome: Progressing   Problem: Elimination: Goal: Will not experience complications related to bowel motility Outcome: Progressing Goal: Will not experience complications related to urinary retention Outcome: Progressing   Problem: Pain Managment: Goal: General experience of comfort will improve Outcome: Progressing   Problem: Safety: Goal: Ability to remain free from injury will improve Outcome: Progressing   Problem: Skin Integrity: Goal: Risk for impaired skin integrity will decrease Outcome: Progressing   Problem: Education: Goal: Knowledge of disease or condition will improve Outcome: Progressing Goal: Understanding of medication regimen will improve Outcome: Progressing Goal: Individualized Educational Video(s) Outcome: Progressing   Problem: Activity: Goal: Ability to tolerate increased activity will improve Outcome: Progressing   Problem: Education: Goal: Knowledge of General Education information will improve Description: Including pain rating scale, medication(s)/side effects and non-pharmacologic comfort measures Outcome: Progressing   Problem: Health Behavior/Discharge Planning: Goal: Ability to manage health-related needs will improve Outcome: Progressing   Problem: Clinical Measurements: Goal: Ability to maintain clinical measurements within normal limits will improve Outcome: Progressing Goal: Will remain free from infection Outcome: Progressing Goal: Diagnostic test results will improve Outcome: Progressing Goal: Respiratory complications will improve Outcome: Progressing Goal: Cardiovascular complication will be avoided Outcome: Progressing   Problem: Activity: Goal:  Risk for activity intolerance will decrease Outcome: Progressing   Problem: Nutrition: Goal: Adequate nutrition will be maintained Outcome: Progressing   Problem: Coping: Goal: Level of anxiety will decrease Outcome: Progressing   Problem: Elimination: Goal: Will not experience complications related to bowel motility Outcome: Progressing Goal: Will not experience complications related to urinary retention Outcome: Progressing   Problem: Pain Managment: Goal: General experience of comfort will improve Outcome: Progressing   Problem: Safety: Goal: Ability to remain free from injury will improve Outcome: Progressing   Problem: Skin Integrity: Goal: Risk for impaired skin integrity will decrease Outcome:  Progressing

## 2023-07-06 NOTE — Assessment & Plan Note (Signed)
Hemoglobin 10.2 this morning. Capsule endoscopy negative for active bleeding.  S/p 500 mg IV Venofer 7/25. GI signed off.  - iron tranfusion today  - Repeat CBC in AM, consider additional transfusions as needed

## 2023-07-06 NOTE — Discharge Summary (Deleted)
Family Medicine Teaching [facility] Discharge Summary  Patient name: Jasmin Miller Medical record number: [id number] Date of birth: 1940/12/22 Age: 82 y.o. Gender: female Date of Admission: 06/28/2023  Date of Discharge: 07/06/23  Admitting Physician: Cyndia Skeeters, DO  Primary Care Provider: Lindaann Pascal, PA-C Consultants: Cardiology, GI,   Indication for Hospitalization: A fib with RVR, Anemia   Brief Hospital Course:  Jasmin Miller is a 82 y.o.female with a history of DM, HTN, MI (1994), CHF, chronic anemia who was admitted to the [facility] Medicine Teaching Service at [facility] for new onset A-fib with RVR.   Her hospital course is detailed below:  Atrial fibrillation with RVR Patient diagnosed with new A Fib during this admission. Cardiology was consulted and started the patient on a diltiazem drip for rate control. Troponins were initially rising, stabilized and then were downtrending. TSH within normal limits.  Echo showed LVEF 40 to 45%.  Patient was ultimately weaned off of Cardizem drip and began metoprolol 100 mg daily. Tolerated this well. Per cardiology recs, patient was discharged on 180mg  diltiazem, 100 mg metoprolol tartrate 100mg  BID, and continued on eliquis for now pending follow up with cardiology outpatient for a watchman device. Of note, patient was profoundly anemic to 4.8 mg/dL on admission, so she was not initially anticoagulated, however, after GI workup as described above, we felt she was stable for anticoagulation.   Anemia At time of presentation patient's hemoglobin was found to be 4.8. She received 2 units PRBCs and her hemoglobin rose to 8.1. Gastroenterology consulted as the patient had previously had endoscopy some years ago which revealed bleeding AVMs. Patient was taken for repeat endoscopy which revealed no active bleeding. GI okay'd patient to continue on anticoagulation. Patient received iron infusion on discharge and was continued on her home iron  supplement.   CHF LVEF 40-45% on echocardiogram obtained 7/22. Patient became volume overloaded with the multiple rate-control drips as above, however responded swiftly to Lasix. She was euvolemic at the time of discharge. Patient discharged on lasix 40mg  daily, losartan 25 mg daily, spironolactone 12.5 mg, and jardiance 10mg .   PCP Follow-up Recommendations: Patient anemic during admission, consider frequent Hg checks d/t hx of AVMs and requirement of transfusion during this admission. Continue iron supplementation Patient on spironolactone, losartan, and K supplementation, recommend repeat BMP to trend K  Patient started on Eliquis for A fib despite anemia, assess her fall risk and the propriety of Anticoagulation for this patient.  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  Eyes:     Extraocular Movements: Extraocular movements intact.     Pupils: Pupils are equal, round, and reactive to light.  Cardiovascular:     Rate and Rhythm: Tachycardia present. Rhythm irregular.     Pulses: Normal pulses.     Heart sounds: Normal heart sounds.  Pulmonary:     Effort: Pulmonary effort is normal.     Breath sounds: Normal breath sounds.  Abdominal:     General: Abdomen is flat. Bowel sounds are normal.  Palpations: Abdomen is soft.  Neurological:     General: No focal deficit present.     Mental Status: She is alert and oriented to person, place, and time.   Psychiatric:        Mood and Affect: Mood normal.        Behavior: Behavior normal.    Significant Procedures:  Capsule endoscopy  Echocardiogram    Significant Labs and Imaging:  Recent Labs  Lab 07/05/23 0103 07/06/23 0106  WBC 13.6* 14.1*  HGB 9.0* 10.2*  HCT 30.7* 34.6*  PLT 151 179   Recent Labs  Lab 07/05/23 0103 07/05/23 1123 07/06/23 0106  NA 140 141 139  K 3.0* 4.4 3.8  CL 107 109 107  CO2 24 22 24   GLUCOSE 125* 99 94  BUN 11 12 10   CREATININE 1.03* 0.96 1.05*  CALCIUM 8.5* 8.6* 8.7*  MG 1.9 1.9 1.8  ALKPHOS  --   --  117  AST  --   --  17  ALT  --   --  25  ALBUMIN  --   --  2.7*     Results/Tests Pending at Time of Discharge:   Discharge Medications:  Allergies as of 07/06/2023   No Known Allergies      Medication List     STOP taking these medications    amLODipine 5 MG tablet Commonly known as: NORVASC   metoprolol succinate 100 MG 24 hr tablet Commonly known as: TOPROL-XL   valsartan 80 MG tablet Commonly known as: DIOVAN       TAKE these medications    apixaban 2.5 MG Tabs tablet Commonly known as: ELIQUIS Take 1 tablet (2.5 mg total) by mouth 2 (two) times daily.   cyanocobalamin 1000 MCG tablet Take 1 tablet (1,000 mcg total) by mouth daily. Start taking on: July 07, 2023   diltiazem 180 MG 24 hr capsule Commonly known as: CARDIZEM CD Take 1 capsule (180 mg total) by mouth daily. Start taking on: July 07, 2023   empagliflozin 10 MG Tabs tablet Commonly known as: JARDIANCE Take 1 tablet (10 mg total) by mouth daily. Start taking on: July 07, 2023   furosemide 40 MG tablet Commonly known as: LASIX Take 1 tablet (40 mg total) by mouth daily. What changed:  when to take this reasons to take this   High Potency Iron 65 MG Tabs Take 1 capsule by mouth daily.   losartan 25 MG tablet Commonly known as: COZAAR Take 1 tablet (25 mg total) by mouth daily. Start taking on: July 07, 2023   metFORMIN 500 MG 24 hr  tablet Commonly known as: GLUCOPHAGE-XR Take 500 mg by mouth every evening.   metoprolol tartrate 100 MG tablet Commonly known as: LOPRESSOR Take 1 tablet (100 mg total) by mouth 2 (two) times daily.   pantoprazole 40 MG tablet Commonly known as: PROTONIX Take 40 mg by mouth daily.   Potassium Chloride ER 20 MEQ Tbcr Take 20 mEq by mouth daily.   rosuvastatin 10 MG tablet Commonly known as: CRESTOR Take 1 tablet (10 mg total) by mouth daily.   spironolactone 25 MG tablet Commonly known as: ALDACTONE Take 0.5 tablets (12.5 mg total) by mouth in the morning. Start taking on: July 07, 2023   triamcinolone cream 0.1 % Commonly known as: KENALOG Apply 1 application topically every 8 (eight) hours as needed (itching).       Discharge Instructions: Please refer to Patient Instructions section of EMR for full details.  Patient was counseled important signs and symptoms that should prompt return to medical care, changes in medications, dietary instructions, activity restrictions, and follow up appointments.   Follow-Up Appointments:  Future Appointments  Date Time Provider Department Center  07/20/2023  3:45 PM Tessa Lerner, DO PCV-PCV None      Penne Lash, MD 07/06/2023, 12:57 PM PGY-1, [facility]    I have evaluated this patient along with Dr. Georg Ruddle and reviewed the above note, making necessary revisions.  Dorothyann Gibbs, MD 07/06/2023, 12:59 PM PGY-3, [facility] Family [facility]

## 2023-07-06 NOTE — Assessment & Plan Note (Signed)
Hemoglobin 10.2 this morning. Capsule endoscopy negative for active bleeding.  S/p 500 mg IV Venofer 7/25. GI signed off.  - Repeat CBC in AM, consider additional transfusions as needed

## 2023-07-06 NOTE — Progress Notes (Signed)
Daily Progress Note Intern Pager: [phone]  Patient name: Jasmin Miller Medical record number: [id number] Date of birth: 23-Oct-1941 Age: 82 y.o. Gender: female  Primary Care Provider: Lindaann Pascal, PA-C Consultants: cardiology, GI  Code Status: full code   Pt Overview and Major Events to Date:  7/21: Admitted to FMTS, received 2U PRBCs 7/24: Received 1U PRBCs 7/26: Endoscopy & Capsule Endoscopy 7/27: Heparin + Digoxin 7/28: started Eliquis 7/29: transitioned to diltiazem, d/c digoxin  Assessment and Plan: Jasmin Miller is a 82 y.o. female who is admitted for new onset Afib with RVR and anemia.  -      Hospital     * (Principal) Atrial fibrillation with rapid ventricular response (HCC)      Patient tachycardiac this morning with RVR. - Cardiology (Dr. Odis Hollingshead) consulted, appreciate recs - Lopressor 100 mg BID for rate control - started 180mg  diltiazem, per cards rec  - Continue Eliquis for a short course and then outpatient referral to  structural heart team for Watchman evaluation - AM CBC/BMP  - Electrolyte goals of K> 4, Mg> 2; replete as necessary        AKI (acute kidney injury) (HCC)     Creatinine 1.035, up from 0.96 yesterday.   Baseline somewhat unclear,  appears to be somewhere around 1. - Avoid nephrotoxic drugs - AM BMP        Anemia     Hemoglobin 10.2 this morning. Capsule endoscopy negative for active  bleeding.  S/p 500 mg IV Venofer 7/25. GI signed off.  - iron tranfusion today  - Repeat CBC in AM, consider additional transfusions as needed        Acute HFrEF (heart failure with reduced ejection fraction) (HCC)     EF 40-45%. Endorses SOB chronically but reports she is okay this AM.  -  continue Lasix 40 mg daily - Jardiance 10 mg daily - started spironolactone 12.5 mg AM and increased lostartan to 25 mg PO  per cardiology recs  - Strict I&Os - Daily weights - Cardiology on board, appreciate recs        Atrial fibrillation with  controlled ventricular rate (HCC)   Concern for DVT Patient complaining to Left upper extremity swelling and pain. On exam, left forearm is tender and swollen as compared to R.  - LUE DVT U/S  Chronic Stable Conditions: Vitamin B12 deficiency: replete with Vit B12 1000 mcg daily T2DM: On Metformin 500 mg daily at home. A1c 6.5. CBGs appropriate, holding home metformin during hospitalization.  HLD: Continue home rosuvastatin 10 mg daily GERD: Protonix 40 mg daily Chronic constipation: MiraLAX daily as needed Postinflammatory hyperpigmentation: Continue Kenalog cream as needed  FEN/GI: heart healthy PPx: eliquis 2.5 mg (weight adjusted)  Dispo:home pending clinical improvement  Subjective:  Patient reports she is ready to go home today. She is complaining of LUE pain where midline IV is placed.   Objective: Temp:  [98.1 F (36.7 C)-99.6 F (37.6 C)] 98.5 F (36.9 C) (07/29 1140) Pulse Rate:  [76-113] 76 (07/29 1140) Resp:  [16-20] 18 (07/29 1140) BP: (126-149)/(64-78) 130/78 (07/29 1140) SpO2:  [95 %-98 %] 95 % (07/29 1140) Weight:  [58.9 kg] 58.9 kg (07/29 0527) Physical Exam:   Appearance: Normal appearance.  Eyes:     Extraocular Movements: Extraocular movements intact.     Pupils: Pupils are equal, round, and reactive to light.  Cardiovascular:     Rate and Rhythm: Tachycardia present. Rhythm irregular.     Pulses:  Normal pulses.     Heart sounds: Normal heart sounds.  Pulmonary:     Effort: Pulmonary effort is normal.     Breath sounds: Normal breath sounds.  Abdominal:     General: Abdomen is flat. Bowel sounds are normal.     Palpations: Abdomen is soft.  MSK:     Tender and swollen left arm as compared to R, distal to midline IV.  Neurological:     General: No focal deficit present.     Mental Status: She is alert and oriented to person, place, and time.  Psychiatric:        Mood and Affect: Mood normal.        Behavior: Behavior normal.    Laboratory: Most recent CBC Lab Results  Component Value Date   WBC 14.1 (H) 07/06/2023   HGB 10.2 (L) 07/06/2023   HCT 34.6 (L) 07/06/2023   MCV 73.9 (L) 07/06/2023   PLT 179 07/06/2023   Most recent BMP    Latest Ref Rng & Units 07/06/2023    1:06 AM  BMP  Glucose 70 - 99 mg/dL 94   BUN 8 - 23 mg/dL 10   Creatinine 2.13 - 1.00 mg/dL 0.86   Sodium 578 - 469 mmol/L 139   Potassium 3.5 - 5.1 mmol/L 3.8   Chloride 98 - 111 mmol/L 107   CO2 22 - 32 mmol/L 24   Calcium 8.9 - 10.3 mg/dL 8.7      Imaging/Diagnostic Tests: LUE DVT Ultrasound: pending  Georg Ruddle Calin Fantroy, MD 07/06/2023, 4:13 PM  PGY-1, [facility] FPTS Intern pager: [phone], text pages welcome Secure chat group [facility] Teaching Service

## 2023-07-06 NOTE — Assessment & Plan Note (Signed)
Creatinine 1.035, up from 0.96 yesterday.   Baseline somewhat unclear, appears to be somewhere around 1. - Avoid nephrotoxic drugs - AM BMP

## 2023-07-06 NOTE — TOC Transition Note (Addendum)
Transition of Care [facility]) - CM/SW Discharge Note   Patient Details  Name: Jasmin Miller MRN: [id number] Date of Birth: August 10, 1941  [facility] CM/SW Contact:  Leone Haven, RN Phone Number: 07/06/2023, 10:23 AM   Clinical Narrative:    Patient is for dc today, she has no needs. NCM spoke with daughter , she will  be transporting her home. Daughter is aware that patient does not want any HH services set up.  She states if the patient changes her mind she will go thru her PCP .  Will be on eliquis and jardiance, copays are 0.00. TOC to fill meds.   Final next level of care: Home w Home Health Services Barriers to Discharge: Continued Medical Work up   Patient Goals and CMS Choice   Choice offered to / list presented to : Patient, Adult Children  Discharge Placement                         Discharge Plan and Services Additional resources added to the After Visit Summary for   In-house Referral: NA Discharge Planning Services: CM Consult Post Acute Care Choice: Home Health            DME Agency: NA       HH Arranged: PT, OT HH Agency: [facility] Date [facility] Agency Contacted: 06/30/23 Time HH Agency Contacted: [id number] Representative spoke with at [facility] Agency: Kandee Keen  Social Determinants of Health (SDOH) Interventions SDOH Screenings   Food Insecurity: No Food Insecurity (06/28/2023)  Housing: Low Risk  (06/28/2023)  Transportation Needs: No Transportation Needs (06/28/2023)  Utilities: Not At Risk (06/28/2023)  Tobacco Use: Medium Risk (06/28/2023)     Readmission Risk Interventions     No data to display

## 2023-07-06 NOTE — Assessment & Plan Note (Signed)
EF 40-45%. Endorses SOB chronically but reports she is okay this AM.  -  continue Lasix 40 mg daily - Jardiance 10 mg daily - started spironolactone 12.5 mg AM and increased lostartan to 25 mg PO per cardiology recs  - Strict I&Os - Daily weights - Cardiology on board, appreciate recs

## 2023-07-06 NOTE — Assessment & Plan Note (Addendum)
Patient tachycardiac this morning with RVR. - Cardiology (Dr. Odis Hollingshead) consulted, appreciate recs Lopressor 100 mg BID for rate contro  - will discontinue digoxin today and load with amiodarone PO as patient cannot receive through midline - continue lopressor 40 mg BID  - Continue Eliquis for a short course and then outpatient referral to structural heart team for Watchman evaluation - AM CBC/BMP  - Electrolyte goals of K> 4, Mg> 2; replete as necessary

## 2023-07-06 NOTE — Progress Notes (Signed)
Progress Note  Patient Name: Jasmin Miller MRN: [id number] DOB: 1941-09-30 Date of Encounter: 07/06/2023  Attending physician: Westley Chandler, MD Primary care provider: Lindaann Pascal, PA-C Primary Cardiologist: Kathie Rhodes. Odis Hollingshead, DO  Subjective: Jasmin Miller is a 82 y.o. African-American female being seen for management of atrial fibrillation with rapid ventricular response, hypertension and acute on chronic diastolic heart failure.  Objective: Vital Signs in the last 24 hours: Temp:  [98.1 F (36.7 C)-99.6 F (37.6 C)] 98.7 F (37.1 C) (07/29 1636) Pulse Rate:  [76-113] 95 (07/29 1636) Resp:  [16-20] 18 (07/29 1636) BP: (121-149)/(49-78) 121/49 (07/29 1636) SpO2:  [94 %-98 %] 94 % (07/29 1636) Weight:  [58.9 kg] 58.9 kg (07/29 0527)  Intake/Output:  Intake/Output Summary (Last 24 hours) at 07/06/2023 1925 Last data filed at 07/06/2023 1500 Gross per 24 hour  Intake 680 ml  Output 1375 ml  Net -695 ml    Net IO Since Admission: -729.7 mL [07/06/23 1925]   Weights:     07/06/2023    5:27 AM 07/05/2023    4:25 AM 07/03/2023    7:35 AM  Last 3 Weights  Weight (lbs) 129 lb 14.4 oz 132 lb 11.5 oz 139 lb 5.3 oz  Weight (kg) 58.922 kg 60.2 kg 63.2 kg      Telemetry:  Overnight telemetry shows Afib w/ both CVR and RVR, which I personally reviewed.   Physical examination: PHYSICAL EXAM: Vitals:   07/06/23 0527 07/06/23 0844 07/06/23 1140 07/06/23 1636  BP: (!) 145/64 126/66 130/78 (!) 121/49  Pulse: (!) 101 (!) 111 76 95  Resp: 16 20 18 18   Temp: 98.1 F (36.7 C) 99.6 F (37.6 C) 98.5 F (36.9 C) 98.7 F (37.1 C)  TempSrc: Oral Oral Oral Oral  SpO2: 95% 98% 95% 94%  Weight: 58.9 kg     Height:       .Physical Exam Neck:     Vascular: No carotid bruit or JVD.  Cardiovascular:     Rate and Rhythm: Tachycardia present. Rhythm irregular.     Pulses: Normal pulses and intact distal pulses.     Heart sounds: No murmur heard. Pulmonary:     Effort: Pulmonary effort is  normal.     Breath sounds: Examination of the left-upper field reveals rhonchi. Examination of the left-middle field reveals rhonchi. Rhonchi present.  Abdominal:     General: Bowel sounds are normal.     Palpations: Abdomen is soft.  Musculoskeletal:     Right lower leg: No edema.     Left lower leg: No edema.  Skin:    Capillary Refill: Capillary refill takes less than 2 seconds.    Lab Results: Chemistry  Recent Labs  Lab 07/05/23 1123 07/06/23 0106 07/06/23 1651  NA 141 139 139  K 4.4 3.8 3.5  CL 109 107 104  CO2 22 24 24   GLUCOSE 99 94 157*  BUN 12 10 12   CREATININE 0.96 1.05* 1.15*  CALCIUM 8.6* 8.7* 8.3*  PROT  --  5.9*  --   ALBUMIN  --  2.7*  --   AST  --  17  --   ALT  --  25  --   ALKPHOS  --  117  --   BILITOT  --  1.3*  --   GFRNONAA 59* 53* 48*  ANIONGAP 10 8 11     Hematology Recent Labs  Lab 07/04/23 0059 07/05/23 0103 07/06/23 0106  WBC 9.3 13.6* 14.1*  RBC 4.09 4.22 4.68  HGB 8.7* 9.0* 10.2*  HCT 29.7* 30.7* 34.6*  MCV 72.6* 72.7* 73.9*  MCH 21.3* 21.3* 21.8*  MCHC 29.3* 29.3* 29.5*  RDW 27.4* 28.0* Not Measured  PLT 127* 151 179   High Sensitivity Troponin:   Recent Labs  Lab 06/28/23 0737 06/28/23 0929 06/28/23 1703 06/28/23 2102 06/29/23 1206  TROPONINIHS 198* 212* 224* 235* 156*     BNP Recent Labs  Lab 07/04/23 1034  BNP 2,650.6*     Hemoglobin A1c:  Lab Results  Component Value Date   HGBA1C 6.5 (H) 06/28/2023   MPG 139.85 06/28/2023   TSH  Recent Labs    06/28/23 1839  TSH 3.235   Lipid Panel  Lab Results  Component Value Date   CHOL 97 06/29/2023   HDL 45 06/29/2023   LDLCALC 42 06/29/2023   LDLDIRECT 39 06/29/2023   TRIG 51 06/29/2023   CHOLHDL 2.2 06/29/2023   Drugs of Abuse     Component Value Date/Time   LABOPIA NONE DETECTED 10/03/2015 1313   COCAINSCRNUR NONE DETECTED 10/03/2015 1313   LABBENZ NONE DETECTED 10/03/2015 1313   AMPHETMU NONE DETECTED 10/03/2015 1313   THCU NONE DETECTED  10/03/2015 1313   LABBARB NONE DETECTED 10/03/2015 1313    Imaging:  Chest x-ray 06/28/2023 single view Lungs are clear. Heart size upper limits normal. Aortic Atherosclerosis  CARDIAC DATABASE:  EKG  EKG 07/05/2023: Atrial fibrillation with rapid ventricular sponsor rate of 1 1 6  bpm, normal axis, frequent PVCs versus aberrantly conducted beats.  Nonspecific T abnormality.  Echocardiogram 06/29/2023:  1. Left ventricular ejection fraction, by estimation, is 40 to 45%. The left ventricle has mildly decreased function. The left ventricle demonstrates global hypokinesis. The left ventricular internal cavity size was mildly dilated. There is mild left  ventricular hypertrophy. Left ventricular diastolic parameters are indeterminate.  2. Right ventricular systolic function is mildly reduced. The right ventricular size is normal. There is mildly elevated pulmonary artery systolic pressure. The estimated right ventricular systolic pressure is 42.9 mmHg.  3. Left atrial size was severely dilated.  4. The mitral valve is abnormal. Severe mitral valve regurgitation.  5. Tricuspid valve regurgitation is mild to moderate.  6. The aortic valve is tricuspid. Aortic valve regurgitation is not visualized. Aortic valve sclerosis/calcification is present, without any evidence of aortic stenosis.  7. The inferior vena cava is dilated in size with <50% respiratory variability, suggesting right atrial pressure of 15 mmHg  Scheduled Meds:  apixaban  2.5 mg Oral BID   vitamin B-12  1,000 mcg Oral Daily   diltiazem  180 mg Oral Daily   empagliflozin  10 mg Oral Daily   furosemide  40 mg Oral Daily   hydrocortisone cream   Topical TID   losartan  25 mg Oral Daily   metoprolol tartrate  100 mg Oral BID   pantoprazole  40 mg Oral Daily   potassium chloride  40 mEq Oral Once   rosuvastatin  10 mg Oral Daily   sodium chloride flush  10-40 mL Intracatheter Q12H   spironolactone  12.5 mg Oral q AM     magnesium sulfate bolus IVPB     PRN Meds: acetaminophen, mouth rinse, polyethylene glycol, sodium chloride flush, triamcinolone cream   IMPRESSION & RECOMMENDATIONS: Jasmin Miller is a 82 y.o. 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  She was told to have cardiac  catheterization remotely in [place] and ?  Myocardial infarction.  Impression: Acute HFpEF, LLVEF 40-45% and not < 40% Atrial fibrillation, new CHADS2-VASc score is 6, indicating a 9.7% annual risk of stroke.  Symptomatic anemia status post blood transfusion. 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  Her LVEF is equal to or >40% and hence considered HFpEF and do not see contraindication for rate controlling with diltiazem as her blood pressure is also elevated.  Will discontinue digoxin as I do not want to use more than 2 drugs that are negative chronotropic's and AV nodal blocking agents and start her on diltiazem CD 180 mg daily, increase losartan to 25 mg daily.  Continue Eliquis for now, will make referral in the outpatient setting for Watchman device as she is extremely high risk for recurrence of GI bleed and she has had prior GI bleed as well.  Would recommend iron transfusion while inpatient in presence of heart failure, iron absorption would be extremely difficult.  Hopefully blood pressure control will improve.  She is presently on beta-blocker therapy, Jardiance, ARB from cardiac standpoint, although could consider Entresto in view of borderline decreased LVEF although I suspect EF will probably be in the lower limit of normal with A-fib  rate control and management of her heart failure.   Yates Decamp, MD, [facility] 07/06/2023, 7:25 PM Office: [phone] Fax: [phone] Pager: [phone]

## 2023-07-06 NOTE — Progress Notes (Signed)
FMTS Interim Progress Note  S:Assessed patient at bedside due to reports of 5 beats NSVT on monitor. Also with new complaint of left arm swelling and pain. Patient is alert and NAD. Tele monitor reviewed with frequent PVCs and 5 beat run of NSVT. Patient denies any associated SOB/CP, or palpitations.   O: BP 130/78 (BP Location: Right Arm)   Pulse 76   Temp 98.5 F (36.9 C) (Oral)   Resp 18   Ht 5\' 1"  (1.549 m)   Wt 58.9 kg   SpO2 95%   BMI 24.54 kg/m   Gen: Alert, NAD Cardio: Irregularly irregular, frequent PVCs seen on monitor Pulm: Normal WOB on RA MSK: LUE with significant edema compared to the right. Generalized tenderness that is worst closest to the mid-line site.    A/P: LUE Swelling Concerning for DVT in the setting of recently placed mid-line. Is on AC but at dose-reduced A fib dosing. - DVT US, if positive will need dose increase of her [facility] - May need mid-line removal but hesitate for now given how difficult access has been for her   PVCs  NSVT  A fib Asymptomatic. Is on two nodal blocking agents (metoprolol and diltiazem). - Continue current management - Will check electrolytes and optimize  - Grateful for [facility] Cardiology's assistance with this patient  - digoxin has been discontinued per Dr. Arman Filter, MD 07/06/2023, 4:12 PM PGY-3, [facility] Family Medicine Service pager [phone]

## 2023-07-06 NOTE — Care Management Important Message (Signed)
Important Message  Patient Details  Name: Jasmin Miller MRN: [id number] Date of Birth: [birth year]   Medicare Important Message Given:  Yes     Renie Ora 07/06/2023, 10:54 AM

## 2023-07-06 NOTE — Assessment & Plan Note (Signed)
Patient tachycardiac this morning with RVR. - Cardiology (Dr. Odis Hollingshead) consulted, appreciate recs - Lopressor 100 mg BID for rate control - started 180mg  diltiazem, per cards rec  - Continue Eliquis for a short course and then outpatient referral to structural heart team for Watchman evaluation - AM CBC/BMP  - Electrolyte goals of K> 4, Mg> 2; replete as necessary

## 2023-07-07 ENCOUNTER — Inpatient Hospital Stay (HOSPITAL_COMMUNITY): Payer: 59

## 2023-07-07 DIAGNOSIS — D649 Anemia, unspecified: Secondary | ICD-10-CM | POA: Diagnosis not present

## 2023-07-07 DIAGNOSIS — I4891 Unspecified atrial fibrillation: Secondary | ICD-10-CM | POA: Diagnosis not present

## 2023-07-07 DIAGNOSIS — M7989 Other specified soft tissue disorders: Secondary | ICD-10-CM | POA: Diagnosis not present

## 2023-07-07 DIAGNOSIS — I5021 Acute systolic (congestive) heart failure: Secondary | ICD-10-CM | POA: Diagnosis not present

## 2023-07-07 DIAGNOSIS — I4729 Other ventricular tachycardia: Secondary | ICD-10-CM

## 2023-07-07 NOTE — Discharge Summary (Cosign Needed Addendum)
Family Medicine Teaching [facility] Discharge Summary  Patient name: Jasmin Miller Medical record number: [id number] Date of birth: 03-Apr-1941 Age: 82 y.o. Gender: female Date of Admission: 06/28/2023  Date of Discharge: 07/07/23  Admitting Physician: Cyndia Skeeters, DO  Primary Care Provider: Lindaann Pascal, PA-C Consultants: Cardiology, GI   Indication for Hospitalization: A fib with RVR, Anemia   Brief Hospital Course:  Sharlett Gramley is a 82 y.o.female with a history of DM, HTN, MI (1994), CHF, chronic anemia who was admitted to the [facility] Medicine Teaching Service at [facility] for new onset A-fib with RVR.   Her hospital course is detailed below:  Atrial fibrillation with RVR Patient diagnosed with new A Fib during this admission. Cardiology was consulted and started the patient on a diltiazem drip for rate control. Troponins were initially rising, stabilized and then were downtrending. TSH within normal limits.  Echo showed LVEF 40 to 45%.  Patient was ultimately weaned off of Cardizem drip and began metoprolol 100 mg daily. Tolerated this well. Per cardiology recs, patient was discharged on 180mg  diltiazem, 25 mg losartan, 100 mg metoprolol tartrate  BID, and continued on eliquis for now pending follow up with cardiology outpatient for a watchman device.   AKI On admission patient's creatinine was 1.25; this rose to 1.59 during the course of her admission and trended down during admission. Patient Cr was 1.05 on discharge. AKI resolved.  Anemia At time of presentation patient's hemoglobin was found to be 4.8. She received 2 units PRBCs and her hemoglobin rose to 8.1. Gastroenterology consulted as the patient had previously had endoscopy some years ago which revealed bleeding AVMs. Patient was taken for repeat endoscopy which revealed no active bleeding. GI okay'd patient to continue on anticoagulation. Patient received iron infusion on discharge and was contributing on her home iron  supplement.   CHF Patient was given one-time dose of IV Lasix 20 mg. Patient discharged on lasix 40mg  daily, losartan 25 mg daily, spironolactone 12.5 mg, and jardiance 10mg .   DM Patient's metformin was held during hospitalization.  Sliding scale insulin was ordered and CBGs checked regularly.  No hypo- or hyperglycemic episodes. At time of discharge patient's home metformin dose of 500 mg daily was restarted.  Postinflammatory hyperpigmentation Patient was very concerned about this issue during hospitalization. She stated that her rash, which was across her shoulders and upper back, was very itchy.  She used Kenalog cream as needed and this improved. Patient discharged on home Kenalog cream.   Acute superficial thrombus of LUE Patient found to have superficial thrombus in her cephalic vein -ac fossa to wrist (7/30).  As it is superficial, anticoagulation is not indicated. Patient instructed to elevate and ice extremity.    Other chronic conditions were medically managed with home medications and formulary alternatives as necessary: Hyperlipidemia: Continue home rosuvastatin 10 mg daily Chronic constipation: MiraLAX daily as needed GERD: Protonix 40 mg daily  PCP Follow-up Recommendations: Patient anemic during admission, consider frequent Hg checks d/t hx of AVMs and requirement of transfusion during this admission. Continue iron supplementation Patient on K sparing diuertics and K supplements, consider BMPs to trend K  Patient started on Eliquis for A fib despite anemia, assess fall risk  Strongly encourage close outpatient follow-up with cardiology, gastroenterology Consider referral to hematology for chronic anemia Patient with superficial thrombus to Left upper extremity, likely 2/2 to midline placement. Anticoagulation is not indicated. If continues to worsen, consider reimaging.  Per cardiology- Consider CT Chest or if you hear  for pulmonary fibrosis in setting of left sided  crackles. Reevaluate.     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  Eyes:     Extraocular Movements: Extraocular movements intact.     Pupils: Pupils are equal, round, and reactive to light.  Cardiovascular:     Rate and Rhythm: Tachycardia present. Rhythm irregular.     Pulses: Normal pulses.     Heart sounds: Normal heart sounds.  Pulmonary:     Effort: Pulmonary effort is normal.     Breath sounds: Course breath sounds at left lung bases   Abdominal:     General: Abdomen is flat. Bowel sounds are normal.     Palpations: Abdomen is soft.  Neurological:     General: No focal deficit present.     Mental Status: She is alert and oriented to person, place, and time.  Psychiatric:        Mood and Affect: Mood normal.        Behavior: Behavior normal.   Significant Procedures:  Echocardiogram Capsule Endoscopy   Significant Labs and Imaging:  Recent Labs  Lab 07/06/23 0106 07/07/23 0112  WBC 14.1* 14.4*  HGB 10.2* 10.1*  HCT 34.6* 34.7*  PLT 179 215   Recent Labs  Lab 07/05/23 1123 07/06/23 0106 07/06/23 1651 07/07/23 0112  NA 141 139 139 139  K 4.4 3.8 3.5 4.3  CL 109 107 104 107  CO2 22 24 24  20*  GLUCOSE 99 94 157* 123*  BUN 12 10 12 14   CREATININE 0.96 1.05* 1.15* 1.23*  CALCIUM 8.6* 8.7* 8.3* 8.3*  MG 1.9 1.8 1.9 2.3  ALKPHOS  --  117  --   --   AST  --  17  --   --   ALT  --  25  --   --   ALBUMIN  --  2.7*  --   --      Results/Tests Pending at Time of Discharge: None   Discharge Medications:  Allergies as of 07/07/2023   No  Known Allergies      Medication List     STOP taking these medications    amLODipine 5 MG tablet Commonly known as: NORVASC   metoprolol succinate 100 MG 24 hr tablet Commonly known as: TOPROL-XL   valsartan 80 MG tablet Commonly known as: DIOVAN       TAKE these medications    cyanocobalamin 1000 MCG tablet Commonly known as: VITAMIN B12 Take 1 tablet (1,000 mcg total) by mouth daily.   diltiazem 180 MG 24 hr capsule Commonly known as: CARDIZEM CD Take 1 capsule (180 mg total) by mouth daily.   Eliquis 2.5 MG Tabs tablet Generic drug: apixaban Take 1 tablet (2.5 mg total) by mouth 2 (two) times daily.   furosemide 40 MG tablet Commonly known as: LASIX Take 1 tablet (40 mg total) by mouth daily. What changed:  when to take this reasons to take this   High Potency Iron 65 MG Tabs Take 1 capsule by mouth daily.   Jardiance 10 MG Tabs tablet Generic drug: empagliflozin Take  1 tablet (10 mg total) by mouth daily.   losartan 25 MG tablet Commonly known as: COZAAR Take 1 tablet (25 mg total) by mouth daily.   metFORMIN 500 MG 24 hr tablet Commonly known as: GLUCOPHAGE-XR Take 500 mg by mouth every evening.   metoprolol tartrate 100 MG tablet Commonly known as: LOPRESSOR Take 1 tablet (100 mg total) by mouth 2 (two) times daily.   pantoprazole 40 MG tablet Commonly known as: PROTONIX Take 40 mg by mouth daily.   Potassium Chloride ER 20 MEQ Tbcr Take 20 mEq by mouth daily.   rosuvastatin 10 MG tablet Commonly known as: CRESTOR Take 1 tablet (10 mg total) by mouth daily.   spironolactone 25 MG tablet Commonly known as: ALDACTONE Take 0.5 tablets (12.5 mg total) by mouth in the morning.   triamcinolone cream 0.1 % Commonly known as: KENALOG Apply 1 application topically every 8 (eight) hours as needed (itching).        Discharge Instructions: Please refer to Patient Instructions section of EMR for full details.  Patient was counseled  important signs and symptoms that should prompt return to medical care, changes in medications, dietary instructions, activity restrictions, and follow up appointments.   Follow-Up Appointments:  Follow-up Information     Long, Lorin Picket, [place]. Schedule an appointment as soon as possible for a visit in 2 week(s).   Specialty: Physician Assistant Contact information: [address] [place] [address] [phone]                 Penne Lash, MD 07/07/2023, 10:35 AM PGY-1, [facility]  I reviewed the above note and agree with it's contents. Glendale Chard, DO [facility], PGY-2 07/07/23 12:38 PM

## 2023-07-07 NOTE — Progress Notes (Signed)
Progress Note  Patient Name: Jasmin Miller MRN: [id number] DOB: October 09, 1941 Date of Encounter: 07/07/2023  Attending physician: Westley Chandler, MD Primary care provider: Lindaann Pascal, PA-C Primary Cardiologist: Kathie Rhodes. Odis Hollingshead, DO  Subjective: Jasmin Miller is a 82 y.o. African-American female being seen for management of atrial fibrillation with rapid ventricular response, hypertension and acute on chronic diastolic heart failure.  Objective: Vital Signs in the last 24 hours: Temp:  [97.6 F (36.4 C)-99.6 F (37.6 C)] 97.6 F (36.4 C) (07/30 0449) Pulse Rate:  [76-111] 93 (07/30 0449) Resp:  [18-20] 20 (07/30 0449) BP: (117-131)/(49-94) 131/94 (07/30 0449) SpO2:  [94 %-98 %] 97 % (07/30 0449) Weight:  [58 kg] 58 kg (07/30 0449)  Intake/Output:  Intake/Output Summary (Last 24 hours) at 07/07/2023 0737 Last data filed at 07/06/2023 2016 Gross per 24 hour  Intake 600 ml  Output 675 ml  Net -75 ml    Net IO Since Admission: -609.7 mL [07/07/23 0737]   Weights:     07/07/2023    4:49 AM 07/06/2023    5:27 AM 07/05/2023    4:25 AM  Last 3 Weights  Weight (lbs) 127 lb 14.4 oz 129 lb 14.4 oz 132 lb 11.5 oz  Weight (kg) 58.015 kg 58.922 kg 60.2 kg      Physical examination: PHYSICAL EXAM: Vitals:   07/06/23 1636 07/06/23 2016 07/07/23 0057 07/07/23 0449  BP: (!) 121/49  117/60 (!) 131/94  Pulse: 95 92 81 93  Resp: 18 20 20 20   Temp: 98.7 F (37.1 C) 98.3 F (36.8 C) 98.1 F (36.7 C) 97.6 F (36.4 C)  TempSrc: Oral Oral Oral Oral  SpO2: 94%  96% 97%  Weight:    58 kg  Height:       .Physical Exam Neck:     Vascular: No carotid bruit or JVD.  Cardiovascular:     Rate and Rhythm: Tachycardia present. Rhythm irregular.     Pulses: Normal pulses and intact distal pulses.     Heart sounds: No murmur heard. Pulmonary:     Effort: Pulmonary effort is normal.     Breath sounds: Examination of the left-upper field reveals rhonchi. Examination of the left-middle field reveals  rhonchi. Examination of the left-lower field reveals rhonchi. Rhonchi present.  Abdominal:     General: Bowel sounds are normal.     Palpations: Abdomen is soft.  Musculoskeletal:        General: Swelling (left forearm) present.     Right lower leg: No edema.     Left lower leg: No edema.  Skin:    Capillary Refill: Capillary refill takes less than 2 seconds.   Lab Results:  Recent Labs  Lab 07/06/23 0106 07/06/23 1651 07/07/23 0112  NA 139 139 139  K 3.8 3.5 4.3  CL 107 104 107  CO2 24 24 20*  GLUCOSE 94 157* 123*  BUN 10 12 14   CREATININE 1.05* 1.15* 1.23*  CALCIUM 8.7* 8.3* 8.3*  PROT 5.9*  --   --   ALBUMIN 2.7*  --   --   AST 17  --   --   ALT 25  --   --   ALKPHOS 117  --   --   BILITOT 1.3*  --   --   GFRNONAA 53* 48* 44*  ANIONGAP 8 11 12     Hematology Recent Labs  Lab 07/05/23 0103 07/06/23 0106 07/07/23 0112  WBC 13.6* 14.1* 14.4*  RBC 4.22 4.68 4.68  HGB 9.0*  10.2* 10.1*  HCT 30.7* 34.6* 34.7*  MCV 72.7* 73.9* 74.1*  MCH 21.3* 21.8* 21.6*  MCHC 29.3* 29.5* 29.1*  RDW 28.0* Not Measured Not Measured  PLT 151 179 215   High Sensitivity Troponin:   Recent Labs  Lab 06/28/23 0737 06/28/23 0929 06/28/23 1703 06/28/23 2102 06/29/23 1206  TROPONINIHS 198* 212* 224* 235* 156*     BNP Recent Labs  Lab 07/04/23 1034  BNP 2,650.6*     Hemoglobin A1c:  Lab Results  Component Value Date   HGBA1C 6.5 (H) 06/28/2023   MPG 139.85 06/28/2023   TSH  Recent Labs    06/28/23 1839  TSH 3.235   Lipid Panel  Lab Results  Component Value Date   CHOL 97 06/29/2023   HDL 45 06/29/2023   LDLCALC 42 06/29/2023   LDLDIRECT 39 06/29/2023   TRIG 51 06/29/2023   CHOLHDL 2.2 06/29/2023   Drugs of Abuse     Component Value Date/Time   LABOPIA NONE DETECTED 10/03/2015 1313   COCAINSCRNUR NONE DETECTED 10/03/2015 1313   LABBENZ NONE DETECTED 10/03/2015 1313   AMPHETMU NONE DETECTED 10/03/2015 1313   THCU NONE DETECTED 10/03/2015 1313   LABBARB  NONE DETECTED 10/03/2015 1313    Imaging:  Chest x-ray 06/28/2023 single view Lungs are clear. Heart size upper limits normal. Aortic Atherosclerosis  CARDIAC DATABASE:  EKG  EKG 07/05/2023: Atrial fibrillation with rapid ventricular sponsor rate of 1 1 6  bpm, normal axis, frequent PVCs versus aberrantly conducted beats.  Nonspecific T abnormality.  Telemetry:   07/07/23 A. Fib with HR 90-106/min. NSVT epidoses x 2  Echocardiogram 06/29/2023:  1. Left ventricular ejection fraction, by estimation, is 40 to 45%. The left ventricle has mildly decreased function. The left ventricle demonstrates global hypokinesis. The left ventricular internal cavity size was mildly dilated. There is mild left  ventricular hypertrophy. Left ventricular diastolic parameters are indeterminate.  2. Right ventricular systolic function is mildly reduced. The right ventricular size is normal. There is mildly elevated pulmonary artery systolic pressure. The estimated right ventricular systolic pressure is 42.9 mmHg.  3. Left atrial size was severely dilated.  4. The mitral valve is abnormal. Severe mitral valve regurgitation.  5. Tricuspid valve regurgitation is mild to moderate.  6. The aortic valve is tricuspid. Aortic valve regurgitation is not visualized. Aortic valve sclerosis/calcification is present, without any evidence of aortic stenosis.  7. The inferior vena cava is dilated in size with <50% respiratory variability, suggesting right atrial pressure of 15 mmHg  Scheduled Meds: . apixaban  2.5 mg Oral BID  . vitamin B-12  1,000 mcg Oral Daily  . diltiazem  180 mg Oral Daily  . empagliflozin  10 mg Oral Daily  . furosemide  40 mg Oral Daily  . hydrocortisone cream   Topical TID  . losartan  25 mg Oral Daily  . metoprolol tartrate  100 mg Oral BID  . pantoprazole  40 mg Oral Daily  . rosuvastatin  10 mg Oral Daily  . sodium chloride flush  10-40 mL Intracatheter Q12H  . spironolactone  12.5 mg Oral  q AM   PRN Meds: acetaminophen, mouth rinse, polyethylene glycol, sodium chloride flush, triamcinolone cream   IMPRESSION & RECOMMENDATIONS: Jasmin Miller is a 82 y.o. 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  She was told to have cardiac catheterization remotely in [place]  and ?  Myocardial infarction.  Impression: 1 Symptomatic anemia status post blood transfusion. 2. Acute HFpEF, LLVEF 40-45% and not < 40% 3. Atrial fibrillation with RVR, new CHADS2-VASc score is 6, indicating a 9.7% annual risk of stroke.   4. NSVT 5. 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   Recommendations:  Patient's heart failure symptoms is related to combination of severe symptomatic anemia from GI bleed, A-fib with RVR and type II MI.  Her LVEF is equal to or >40% and hence considered HFpEF and do not see contraindication for rate controlling with diltiazem.  Continue anticoagulation.  Will make referral for Watchman device in the outpatient basis.  Continue diltiazem CD1 80 mg daily.  Continue metoprolol tartrate 100 mg p.o. twice daily.  In view of NSVT, diabetes mellitus, significant coronary artery disease still need to be excluded.  Will do outpatient cardiac risk stratification with stress testing.  Her left lung crackles both in the middle and base is out of proportion to heart failure.  Would consider repeating chest x-ray or even consideration for high-resolution CT.  I will sign off for now, please call if questions.  Left upper extremity swelling is negative for DVT.   Yates Decamp, MD, [facility] 07/07/2023, 7:37 AM Office: [phone] Fax: [phone] Pager: [phone]

## 2023-07-07 NOTE — Assessment & Plan Note (Signed)
Hemoglobin 10.1 this morning. Stable from yesterday. Patient s/p iron transfusion yesterday.  - AM CBC - recommend iron supplementation outpatient    Hemoglobin & Hematocrit     Component Value Date/Time   HGB 10.1 (L) 07/07/2023 0112   HGB 9.0 (L) 10/17/2021 1347   HCT 34.7 (L) 07/07/2023 0112

## 2023-07-07 NOTE — Assessment & Plan Note (Addendum)
Patient reported some new swelling to Left upper extremity 7/29, distal to midline. Concerning for DVT - Duplex pending  - elevate arm as able - will treat with higher dose of AC if positive

## 2023-07-07 NOTE — Progress Notes (Incomplete)
Daily Progress Note Intern Pager: [phone]  Patient name: Jasmin Miller Medical record number: [id number] Date of birth: 1941-06-01 Age: 82 y.o. Gender: female  Primary Care Provider: Lindaann Pascal, PA-C Consultants: cardiology, GI Code Status: Full   Pt Overview and Major Events to Date:  7/21: Admitted to FMTS, received 2U PRBCs 7/24: Received 1U PRBCs 7/26: Endoscopy & Capsule Endoscopy 7/27: Heparin + Digoxin 7/28: started Eliquis 7/29: transitioned to diltiazem, d/c digoxin 7/30:   Assessment and Plan: Jasmin Miller is a 82 y.o. female who is admitted for new onset Afib with RVR and anemia.  -      Hospital     * (Principal) Atrial fibrillation with rapid ventricular response (HCC)      Patient tachycardiac this morning with RVR. - Cardiology (Dr. Odis Hollingshead) consulted, appreciate recs - Lopressor 100 mg BID for rate control - started 180mg  diltiazem, per cards rec  - Continue Eliquis for a short course and then outpatient referral to  structural heart team for Watchman evaluation - AM CBC/BMP  - Electrolyte goals of K> 4, Mg> 2; replete as necessary        AKI (acute kidney injury) (HCC)     Creatinine 1.035, up from 0.96 yesterday.   Baseline somewhat unclear,  appears to be somewhere around 1. - Avoid nephrotoxic drugs - AM BMP        Anemia     Hemoglobin 10.2 this morning. Capsule endoscopy negative for active  bleeding.  S/p 500 mg IV Venofer 7/25. GI signed off.  - iron tranfusion today  - Repeat CBC in AM, consider additional transfusions as needed        Acute HFrEF (heart failure with reduced ejection fraction) (HCC)     EF 40-45%. Endorses SOB chronically but reports she is okay this AM.  -  continue Lasix 40 mg daily - Jardiance 10 mg daily - started spironolactone 12.5 mg AM and increased lostartan to 25 mg PO  per cardiology recs  - Strict I&Os - Daily weights - Cardiology on board, appreciate recs        Atrial fibrillation with  controlled ventricular rate (HCC)    FEN/GI: heart healthy  PPx: eliquis 2.5 mg, weight adjusted Dispo:Home pending clinical improvement . Barriers include clinical improvement.   Subjective:  ***  Objective: Temp:  [97.6 F (36.4 C)-99.6 F (37.6 C)] 97.6 F (36.4 C) (07/30 0449) Pulse Rate:  [76-111] 93 (07/30 0449) Resp:  [18-20] 20 (07/30 0449) BP: (117-131)/(49-94) 131/94 (07/30 0449) SpO2:  [94 %-98 %] 97 % (07/30 0449) Weight:  [58 kg] 58 kg (07/30 0449) Physical Exam: General: *** Cardiovascular: *** Respiratory: *** Abdomen: *** Extremities: ***  Laboratory: Most recent CBC Lab Results  Component Value Date   WBC 14.4 (H) 07/07/2023   HGB 10.1 (L) 07/07/2023   HCT 34.7 (L) 07/07/2023   MCV 74.1 (L) 07/07/2023   PLT 215 07/07/2023   Most recent BMP    Latest Ref Rng & Units 07/07/2023    1:12 AM  BMP  Glucose 70 - 99 mg/dL 147   BUN 8 - 23 mg/dL 14   Creatinine 8.29 - 1.00 mg/dL 5.62   Sodium 130 - 865 mmol/L 139   Potassium 3.5 - 5.1 mmol/L 4.3   Chloride 98 - 111 mmol/L 107   CO2 22 - 32 mmol/L 20   Calcium 8.9 - 10.3 mg/dL 8.3     Other pertinent labs ***   Imaging/Diagnostic Tests: LUE  Ultrasound:  Penne Lash, MD 07/07/2023, 7:00 AM  PGY-1, [facility] FPTS Intern pager: [phone], text pages welcome Secure chat group [facility] Teaching Service

## 2023-07-07 NOTE — Assessment & Plan Note (Addendum)
EF 40-45%. Endorses her breathing is okay today. Slight crackles on R lung base -  continue Lasix 40 mg daily - Jardiance 10 mg daily - continue spironolactone 12.5 mg and losartan 25 mg PO per cardiology recs  - Strict I&Os - Daily weights - Cardiology on board, appreciate recs

## 2023-07-07 NOTE — Assessment & Plan Note (Signed)
Patient in normal sinus rythmn this morning.  - Cardiology (Dr. Odis Hollingshead) consulted, appreciate recs - Lopressor 100 mg BID for rate control - continue 180mg  diltiazem, per cards rec  - Continue Eliquis for a short course and then outpatient referral to structural heart team for Watchman evaluation- will reevaluate dosing in concern for LUE DVT  - AM CBC/BMP  - Electrolyte goals of K> 4, Mg> 2; replete as necessary

## 2023-07-07 NOTE — Assessment & Plan Note (Signed)
Creatinine 1.23 up from 1.035 yesterday. Baseline somewhat unclear, appears to be somewhere around 1. Some fluctuation to be expected - Avoid nephrotoxic drugs - AM BMP

## 2023-07-07 NOTE — Progress Notes (Signed)
Left upper extremity venous study completed.   Preliminary results relayed to MD and RN.  Please see CV Procedures for preliminary results.  Deonta Bomberger, RVT  9:14 AM 07/07/23

## 2023-07-09 ENCOUNTER — Telehealth: Payer: Self-pay

## 2023-07-09 NOTE — Telephone Encounter (Signed)
Location of hospitalization: [facility] Reason for hospitalization: palpations  Date of discharge: 07/07/2023 Date of first communication with patient: today Person contacting patient: Me Current symptoms: swelling  Do you understand why you were in the Hospital: Yes Questions regarding discharge instructions: None Where were you discharged to: Home Medications reviewed: Yes Allergies reviewed: Yes Dietary changes reviewed: Yes. Discussed low fat and low salt diet.  Referals reviewed: NA Activities of Daily Living: Able to with mild limitations Any transportation issues/concerns: None Any patient concerns: None Confirmed importance & date/time of Follow up appt: Yes Confirmed with patient if condition begins to worsen call. Pt was given the office number and encouraged to call back with questions or concerns: Yes

## 2023-07-13 ENCOUNTER — Encounter: Payer: Self-pay | Admitting: Hematology and Oncology

## 2023-07-20 ENCOUNTER — Ambulatory Visit: Payer: 59 | Admitting: Cardiology

## 2023-08-06 ENCOUNTER — Other Ambulatory Visit (HOSPITAL_COMMUNITY): Payer: Self-pay

## 2023-08-07 ENCOUNTER — Encounter: Payer: Self-pay | Admitting: Cardiology

## 2023-08-07 ENCOUNTER — Ambulatory Visit: Payer: 59 | Admitting: Cardiology

## 2023-08-07 VITALS — BP 144/77 | HR 65 | Resp 16 | Ht 61.0 in | Wt 128.0 lb

## 2023-08-07 DIAGNOSIS — I4729 Other ventricular tachycardia: Secondary | ICD-10-CM

## 2023-08-07 DIAGNOSIS — E119 Type 2 diabetes mellitus without complications: Secondary | ICD-10-CM

## 2023-08-07 DIAGNOSIS — Z7901 Long term (current) use of anticoagulants: Secondary | ICD-10-CM

## 2023-08-07 DIAGNOSIS — I5022 Chronic systolic (congestive) heart failure: Secondary | ICD-10-CM

## 2023-08-07 DIAGNOSIS — D649 Anemia, unspecified: Secondary | ICD-10-CM

## 2023-08-07 DIAGNOSIS — I480 Paroxysmal atrial fibrillation: Secondary | ICD-10-CM

## 2023-08-07 DIAGNOSIS — I10 Essential (primary) hypertension: Secondary | ICD-10-CM

## 2023-08-07 MED ORDER — EMPAGLIFLOZIN 10 MG PO TABS
10.0000 mg | ORAL_TABLET | Freq: Every day | ORAL | 0 refills | Status: DC
Start: 2023-08-07 — End: 2023-11-13

## 2023-08-07 MED ORDER — SPIRONOLACTONE 25 MG PO TABS
12.5000 mg | ORAL_TABLET | Freq: Every morning | ORAL | 0 refills | Status: DC
Start: 2023-08-07 — End: 2023-10-27

## 2023-08-07 MED ORDER — APIXABAN 2.5 MG PO TABS
2.5000 mg | ORAL_TABLET | Freq: Two times a day (BID) | ORAL | 0 refills | Status: AC
Start: 2023-08-07 — End: 2023-11-05

## 2023-08-07 MED ORDER — MULTAQ 400 MG PO TABS
400.0000 mg | ORAL_TABLET | Freq: Two times a day (BID) | ORAL | 0 refills | Status: AC
Start: 2023-08-07 — End: 2023-11-05

## 2023-08-07 MED ORDER — ENTRESTO 24-26 MG PO TABS
1.0000 | ORAL_TABLET | Freq: Two times a day (BID) | ORAL | 0 refills | Status: AC
Start: 2023-08-07 — End: 2023-11-05

## 2023-08-07 NOTE — Progress Notes (Signed)
ID:  Jasmin Miller, DOB 06/01/1941, MRN [id number]  PCP:  Lindaann Pascal, PA-C  Cardiologist:  Tessa Lerner, DO, [facility] (established care 06/28/2023) Former Cardiology Providers: Dr. Truett Mainland, [place]vin So, [place]  Date: 08/07/23  Chief Complaint  Patient presents with   Atrial Fibrillation    Hospital follow-up    HPI  Jasmin Miller is a 82 y.o. African-American female whose past medical history and cardiovascular risk factors include: Paroxysmal atrial fibrillation, hypertension, hyperlipidemia, anemia, history of myocardial infarction (1994), history of GI bleed and severe symptomatic anemia, advanced age  Patient was seen during her recent hospitalization in July 2024 when she presented with a chief complaint of fast heart rates.  She was noted to be in A-fib with RVR and laboratory findings notable for severe symptomatic anemia with a hemoglobin of 4.8 g/dL.  With regards to her symptomatic anemia she did undergo blood transfusions and evaluation by GI and endoscopy.  Once she was cleared by GI she was started back on anticoagulation for thromboembolic prophylaxis as her CHA2DS2-VASc score was 6.  With regards to her atrial fibrillation decision was to continue with rate control strategy.  She was on diltiazem and Lopressor prior to discharge.  Patient has ran out of diltiazem and is only taking Lopressor 100 mg p.o. twice daily.  She presents today for outpatient follow-up visit after her recent hospitalization.  She still has not followed up with PCP as advised by attending physician during her recent hospitalization.  Clinically denies anginal chest pain or heart failure symptoms.  Denies palpitations, near-syncope, syncope, or bleeding.  CARDIAC DATABASE: EKG: 08/07/2023: Sinus rhythm, 71 bpm, occasional PACs, without underlying injury pattern  Echocardiogram: 06/29/2023:  1. Left ventricular ejection fraction, by estimation, is 40 to 45%. The left ventricle has mildly  decreased function. The left ventricle demonstrates global hypokinesis. The left ventricular internal cavity size was mildly dilated. There is mild left  ventricular hypertrophy. Left ventricular diastolic parameters are indeterminate.  2. Right ventricular systolic function is mildly reduced. The right ventricular size is normal. There is mildly elevated pulmonary artery systolic pressure. The estimated right ventricular systolic pressure is 42.9 mmHg.  3. Left atrial size was severely dilated.  4. The mitral valve is abnormal. Severe mitral valve regurgitation.  5. Tricuspid valve regurgitation is mild to moderate.  6. The aortic valve is tricuspid. Aortic valve regurgitation is not visualized. Aortic valve sclerosis/calcification is present, without any evidence of aortic stenosis.  7. The inferior vena cava is dilated in size with <50% respiratory variability, suggesting right atrial pressure of 15 mmHg   ALLERGIES: No Known Allergies  MEDICATION LIST PRIOR TO VISIT: Current Meds  Medication Sig   dronedarone (MULTAQ) 400 MG tablet Take 1 tablet (400 mg total) by mouth 2 (two) times daily with a meal.   metFORMIN (GLUCOPHAGE-XR) 500 MG 24 hr tablet Take 500 mg by mouth every evening.   metoprolol tartrate (LOPRESSOR) 100 MG tablet Take 100 mg by mouth 2 (two) times daily.   pantoprazole (PROTONIX) 40 MG tablet Take 40 mg by mouth daily.   rosuvastatin (CRESTOR) 10 MG tablet Take 1 tablet (10 mg total) by mouth daily.   sacubitril-valsartan (ENTRESTO) 24-26 MG Take 1 tablet by mouth 2 (two) times daily.   [DISCONTINUED] amLODipine (NORVASC) 5 MG tablet Oral   [DISCONTINUED] apixaban (ELIQUIS) 2.5 MG TABS tablet Take 1 tablet (2.5 mg total) by mouth 2 (two) times daily.   [DISCONTINUED] diltiazem (CARDIZEM CD) 180 MG 24 hr capsule Take  1 capsule (180 mg total) by mouth daily.   [DISCONTINUED] empagliflozin (JARDIANCE) 10 MG TABS tablet Take 1 tablet (10 mg total) by mouth daily.    [DISCONTINUED] losartan (COZAAR) 25 MG tablet Take 1 tablet (25 mg total) by mouth daily.   [DISCONTINUED] metoprolol tartrate (LOPRESSOR) 100 MG tablet Take 1 tablet (100 mg total) by mouth 2 (two) times daily.   [DISCONTINUED] spironolactone (ALDACTONE) 25 MG tablet Take 0.5 tablets (12.5 mg total) by mouth in the morning.   [DISCONTINUED] valsartan (DIOVAN) 80 MG tablet Oral     PAST MEDICAL HISTORY: Past Medical History:  Diagnosis Date   DM2 (diabetes mellitus, type 2) (HCC)    Hypertension    Symptomatic anemia    11/2018    PAST SURGICAL HISTORY: Past Surgical History:  Procedure Laterality Date   ABDOMINAL HYSTERECTOMY     BIOPSY  07/12/2020   Procedure: BIOPSY;  Surgeon: Charlott Rakes, MD;  Location: [facility] ENDOSCOPY;  Service: Endoscopy;;   COLONOSCOPY  07/12/2020   COLONOSCOPY WITH PROPOFOL N/A 07/12/2020   Procedure: COLONOSCOPY WITH PROPOFOL;  Surgeon: Charlott Rakes, MD;  Location[facility] ENDOSCOPY;  Service: Endoscopy;  Laterality: N/A;   ESOPHAGOGASTRODUODENOSCOPY  07/12/2020   ESOPHAGOGASTRODUODENOSCOPY N/A 07/12/2020   Procedure: ESOPHAGOGASTRODUODENOSCOPY (EGD);  Surgeon: Charlott Rakes, MD;  Location: [facility] ENDOSCOPY;  Service: Endoscopy;  Laterality: N/A;   ESOPHAGOGASTRODUODENOSCOPY N/A 04/19/2021   Procedure: ESOPHAGOGASTRODUODENOSCOPY (EGD);  Surgeon: Charlott Rakes, MD;  Location: [facility] ENDOSCOPY;  Service: Endoscopy;  Laterality: N/A;   ESOPHAGOGASTRODUODENOSCOPY (EGD) WITH PROPOFOL Left 07/03/2023   Procedure: ESOPHAGOGASTRODUODENOSCOPY (EGD) WITH PROPOFOL;  Surgeon: Willis Modena, MD;  Location: [facility] ENDOSCOPY;  Service: Gastroenterology;  Laterality: Left;   GIVENS CAPSULE STUDY N/A 07/03/2023   Procedure: GIVENS CAPSULE STUDY;  Surgeon: Willis Modena, MD;  Location: [facility] ENDOSCOPY;  Service: Gastroenterology;  Laterality: N/A;   HOT HEMOSTASIS N/A 07/12/2020   Procedure: HOT HEMOSTASIS (ARGON PLASMA COAGULATION/BICAP);  Surgeon: Charlott Rakes, MD;  Location: [facility]  ENDOSCOPY;  Service: Endoscopy;  Laterality: N/A;  EGD and COLON    FAMILY HISTORY: The patient family history includes Anemia in her daughter and mother; Stroke in her mother.  SOCIAL HISTORY:  The patient  reports that she quit smoking about 31 years ago. Her smoking use included cigarettes. She started smoking about 60 years ago. She has a 29 pack-year smoking history. She has never used smokeless tobacco. She reports that she does not drink alcohol and does not use drugs.  REVIEW OF SYSTEMS: Review of Systems  Cardiovascular:  Negative for chest pain, claudication, dyspnea on exertion, irregular heartbeat, leg swelling, near-syncope, orthopnea, palpitations, paroxysmal nocturnal dyspnea and syncope.  Respiratory:  Negative for shortness of breath.   Hematologic/Lymphatic: Negative for bleeding problem.  Musculoskeletal:  Negative for muscle cramps and myalgias.  Neurological:  Negative for dizziness and light-headedness.    PHYSICAL EXAM:    08/07/2023    1:51 PM 08/07/2023    1:37 PM 07/07/2023    8:10 AM  Vitals with BMI  Height  5\' 1"    Weight  128 lbs   BMI  24.2   Systolic 144  142  Diastolic 77  73  Pulse 65 69 95    Physical Exam  Constitutional: No distress.  Age appropriate, hemodynamically stable.   Neck: No JVD present.  Cardiovascular: Normal rate, regular rhythm, S1 normal, S2 normal, intact distal pulses and normal pulses. Exam reveals no gallop, no S3 and no S4.  No murmur heard. Pulmonary/Chest: Effort normal and breath sounds  normal. No stridor. She has no wheezes. She has no rales.  Abdominal: Soft. Bowel sounds are normal. She exhibits no distension. There is no abdominal tenderness.  Musculoskeletal:        General: No edema.     Cervical back: Neck supple.  Neurological: She is alert and oriented to person, place, and time. She has intact cranial nerves (2-12).  Skin: Skin is warm and moist.   LABORATORY DATA:    Latest Ref Rng & Units 07/07/2023     1:12 AM 07/06/2023    1:06 AM 07/05/2023    1:03 AM  CBC  WBC 4.0 - 10.5 K/uL 14.4  14.1  13.6   Hemoglobin 12.0 - 15.0 g/dL 84.6  96.2  9.0   Hematocrit 36.0 - 46.0 % 34.7  34.6  30.7   Platelets 150 - 400 K/uL 215  179  151        Latest Ref Rng & Units 07/07/2023    1:12 AM 07/06/2023    4:51 PM 07/06/2023    1:06 AM  CMP  Glucose 70 - 99 mg/dL 952  841  94   BUN 8 - 23 mg/dL 14  12  10    Creatinine 0.44 - 1.00 mg/dL 3.24  4.01  0.27   Sodium 135 - 145 mmol/L 139  139  139   Potassium 3.5 - 5.1 mmol/L 4.3  3.5  3.8   Chloride 98 - 111 mmol/L 107  104  107   CO2 22 - 32 mmol/L 20  24  24    Calcium 8.9 - 10.3 mg/dL 8.3  8.3  8.7   Total Protein 6.5 - 8.1 g/dL   5.9   Total Bilirubin 0.3 - 1.2 mg/dL   1.3   Alkaline Phos 38 - 126 U/L   117   AST 15 - 41 U/L   17   ALT 0 - 44 U/L   25     Lab Results  Component Value Date   CHOL 97 06/29/2023   HDL 45 06/29/2023   LDLCALC 42 06/29/2023   LDLDIRECT 39 06/29/2023   TRIG 51 06/29/2023   CHOLHDL 2.2 06/29/2023   No components found for: "NTPROBNP" No results for input(s): "PROBNP" in the last 8760 hours. Recent Labs    06/28/23 1839  TSH 3.235    BMP Recent Labs    07/06/23 0106 07/06/23 1651 07/07/23 0112  NA 139 139 139  K 3.8 3.5 4.3  CL 107 104 107  CO2 24 24 20*  GLUCOSE 94 157* 123*  BUN 10 12 14   CREATININE 1.05* 1.15* 1.23*  CALCIUM 8.7* 8.3* 8.3*  GFRNONAA 53* 48* 44*    HEMOGLOBIN A1C Lab Results  Component Value Date   HGBA1C 6.5 (H) 06/28/2023   MPG 139.85 06/28/2023    IMPRESSION:    ICD-10-CM   1. Heart failure with mildly reduced ejection fraction (HFmrEF) (HCC)  I50.22 EKG 12-Lead    empagliflozin (JARDIANCE) 10 MG TABS tablet    spironolactone (ALDACTONE) 25 MG tablet    sacubitril-valsartan (ENTRESTO) 24-26 MG    PCV MYOCARDIAL PERFUSION WITH LEXISCAN    Basic metabolic panel    Pro b natriuretic peptide (BNP)    Magnesium    2. Paroxysmal atrial fibrillation (HCC)  I48.0  apixaban (ELIQUIS) 2.5 MG TABS tablet    dronedarone (MULTAQ) 400 MG tablet    PCV MYOCARDIAL PERFUSION WITH LEXISCAN    Ambulatory referral to Cardiac Electrophysiology    3.  Long term (current) use of anticoagulants  Z79.01 apixaban (ELIQUIS) 2.5 MG TABS tablet    Hemoglobin and hematocrit, blood    4. NSVT (nonsustained ventricular tachycardia) (HCC)  I47.29 PCV MYOCARDIAL PERFUSION WITH LEXISCAN    5. Symptomatic anemia  D64.9     6. Benign hypertension  I10     7. Non-insulin dependent type 2 diabetes mellitus (HCC)  E11.9        RECOMMENDATIONS: Jasmin Miller is a 82 y.o. African-American female whose past medical history and cardiac risk factors include: Paroxysmal atrial fibrillation, hypertension, hyperlipidemia, anemia, history of myocardial infarction (1994), history of GI bleed and severe symptomatic anemia, aortic atherosclerosis advanced age.  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 Currently euvolemic and not in heart failure. Discontinue losartan and valsartan. Start Entresto 24/26 mg p.o. twice daily. Refilled Jardiance, spironolactone Strict I's and O's and daily weights.  Paroxysmal atrial fibrillation (HCC) Long term (current) use of anticoagulants Currently on Lopressor 100 mg p.o. twice daily At the time of discharge she was also on diltiazem 180 mg p.o. daily-she has ran out of these pills. EKG today shows sinus rhythm with occasional PACs. Will continue Lopressor for now and will transition to Toprol at next visit.  Start Multaq 400mg  po bid  Continue / refilled Eliquis Click Here to Calculate/Change CHADS2VASc Score The patient's CHADS2-VASc score is 6, indicating a 9.7% annual risk of stroke.   CHF History: Yes HTN History: Yes Diabetes History: No Stroke History: No Vascular Disease History: Yes Given her history of severe symptomatic anemia requiring blood transfusions  would recommend left atrial appendage closure device evaluation.  Will refer to Dr. Lalla Brothers for further evaluation and management  I have seen Hannan Megill who is a 82 y.o. y.o. female in the office today. The patient is felt to be a poor candidate for long-term anticoagulation because of hx of GI bleeding /anemia needed PRBCs . Her CHA2DS2-VASc SCORE is 6 which correlates to 9.7 % risk of stroke per year. We have discussed the bleeding risk in the context of their comorbid medical problems, as well as the rationale for referral for evaluation of Watchman left atrial appendage occlusion therapy. While the patient is at high long-term bleeding risk, they may be appropriate for short-term anticoagulation. Based on this individual patient's stroke and bleeding risk, a shared decision has been made to refer the patient for consideration of Watchman left atrial appendage closure utilizing the [employer] shareddecision tool."  NSVT (nonsustained ventricular tachycardia) (HCC) Continue Lopressor 100 mg p.o. twice daily. Pharmacological stress test to evaluate for reversible ischemia given the new diagnosis of mildly reduced LVEF and paroxysmal A-fib.  Symptomatic anemia Status post endoscopy during her recent hospitalization. Reemphasized importance of continuing iron supplementation. Reemphasized importance of following up with PCP. Will check H&H to monitor hemoglobin and given her history  Benign hypertension Office blood pressures are reasonable but not at goal. Medication changes as noted above  FINAL MEDICATION LIST END OF ENCOUNTER: Meds ordered this encounter  Medications   empagliflozin (JARDIANCE) 10 MG TABS tablet    Sig: Take 1 tablet (10 mg total) by mouth daily.    Dispense:  90 tablet    Refill:  0   spironolactone (ALDACTONE) 25 MG tablet    Sig: Take 0.5 tablets (12.5 mg total) by mouth in the morning.    Dispense:  45 tablet    Refill:  0    apixaban (ELIQUIS) 2.5 MG TABS tablet    Sig: Take 1 tablet (2.5 mg total) by mouth 2 (two) times daily.    Dispense:  180 tablet    Refill:  0   sacubitril-valsartan (ENTRESTO) 24-26 MG    Sig: Take 1 tablet by mouth 2 (two) times daily.    Dispense:  180 tablet    Refill:  0   dronedarone (MULTAQ) 400 MG tablet    Sig: Take 1 tablet (400 mg total) by mouth 2 (two) times daily with a meal.    Dispense:  180 tablet    Refill:  0    Medications Discontinued During This Encounter  Medication Reason   valsartan (DIOVAN) 80 MG tablet    metoprolol tartrate (LOPRESSOR) 100 MG tablet    amLODipine (NORVASC) 5 MG tablet    diltiazem (CARDIZEM CD) 180 MG 24 hr capsule    furosemide (LASIX) 40 MG tablet    losartan (COZAAR) 25 MG tablet Change in therapy   Potassium Chloride ER 20 MEQ TBCR    apixaban (ELIQUIS) 2.5 MG TABS tablet Reorder   empagliflozin (JARDIANCE) 10 MG TABS tablet Reorder   spironolactone (ALDACTONE) 25 MG tablet Reorder     Current Outpatient Medications:    dronedarone (MULTAQ) 400 MG tablet, Take 1 tablet (400 mg total) by mouth 2 (two) times daily with a meal., Disp: 180 tablet, Rfl: 0   metFORMIN (GLUCOPHAGE-XR) 500 MG 24 hr tablet, Take 500 mg by mouth every evening., Disp: , Rfl:    metoprolol tartrate (LOPRESSOR) 100 MG tablet, Take 100 mg by mouth 2 (two) times daily., Disp: , Rfl:    pantoprazole (PROTONIX) 40 MG tablet, Take 40 mg by mouth daily., Disp: , Rfl:    rosuvastatin (CRESTOR) 10 MG tablet, Take 1 tablet (10 mg total) by mouth daily., Disp: 90 tablet, Rfl: 3   sacubitril-valsartan (ENTRESTO) 24-26 MG, Take 1 tablet by mouth 2 (two) times daily., Disp: 180 tablet, Rfl: 0   apixaban (ELIQUIS) 2.5 MG TABS tablet, Take 1 tablet (2.5 mg total) by mouth 2 (two) times daily., Disp: 180 tablet, Rfl: 0   cyanocobalamin 1000 MCG tablet, Take 1 tablet (1,000 mcg total) by mouth daily. (Patient not taking: Reported on 08/07/2023), Disp: 30 tablet, Rfl: 0    empagliflozin (JARDIANCE) 10 MG TABS tablet, Take 1 tablet (10 mg total) by mouth daily., Disp: 90 tablet, Rfl: 0   Ferrous Sulfate Dried (HIGH POTENCY IRON) 65 MG TABS, Take 1 capsule by mouth daily. (Patient not taking: Reported on 08/07/2023), Disp: , Rfl:    spironolactone (ALDACTONE) 25 MG tablet, Take 0.5 tablets (12.5 mg total) by mouth in the morning., Disp: 45 tablet, Rfl: 0   triamcinolone cream (KENALOG) 0.1 %, Apply 1 application topically every 8 (eight) hours as needed (itching). , Disp: , Rfl:   Orders Placed This Encounter  Procedures   Basic metabolic panel   Pro b natriuretic peptide (BNP)   Magnesium   Hemoglobin and hematocrit, blood   Ambulatory referral to Cardiac Electrophysiology   PCV MYOCARDIAL PERFUSION WITH LEXISCAN   EKG 12-Lead    There are no Patient Instructions on file for this visit.   --Continue cardiac medications as reconciled in final medication list. --Return in about 3 months (around 11/07/2023) for Follow up paroxysmal atrial fibrillation, HFmrEF. or sooner if needed. --Continue follow-up with your primary care physician regarding the management of your other chronic comorbid conditions.  Patient's questions and concerns  were addressed to her satisfaction. She voices understanding of the instructions provided during this encounter.   This note was created using a voice recognition software as a result there may be grammatical errors inadvertently enclosed that do not reflect the nature of this encounter. Every attempt is made to correct such errors.  Tessa Lerner, [place], [facility]  Pager:  [phone] Office: [phone]

## 2023-08-09 ENCOUNTER — Telehealth: Payer: Self-pay | Admitting: Cardiology

## 2023-08-09 DIAGNOSIS — I480 Paroxysmal atrial fibrillation: Secondary | ICD-10-CM

## 2023-08-09 MED ORDER — METOPROLOL TARTRATE 100 MG PO TABS
100.0000 mg | ORAL_TABLET | Freq: Two times a day (BID) | ORAL | 0 refills | Status: DC
Start: 2023-08-09 — End: 2023-11-18

## 2023-08-09 NOTE — Telephone Encounter (Signed)
ON-CALL CARDIOLOGY 08/09/23  Patient's name: Jasmin Miller.   MRN: [id number].    DOB: 1941-04-22 Primary care provider: Lindaann Pascal, PA-C. Primary cardiologist: Tessa Lerner, DO, [facility]  Interaction regarding this patient's care today: Patient called the answering service asking for a refill on Lopressor 100 mg p.o. twice daily.  Impression:   ICD-10-CM   1. Paroxysmal atrial fibrillation (HCC)  I48.0 metoprolol tartrate (LOPRESSOR) 100 MG tablet      Meds ordered this encounter  Medications   metoprolol tartrate (LOPRESSOR) 100 MG tablet    Sig: Take 1 tablet (100 mg total) by mouth 2 (two) times daily. Hold if systolic blood pressure (top number) less than 100 mmHg or pulse less than 55 bpm.    Dispense:  180 tablet    Refill:  0    No orders of the defined types were placed in this encounter.   Recommendations: Medication refilled  Telephone encounter total time: 5 minutes  No charge  [place], [place], [facility]  Pager: [phone] Office: [phone]

## 2023-08-17 ENCOUNTER — Ambulatory Visit: Payer: 59

## 2023-08-19 ENCOUNTER — Telehealth: Payer: Self-pay

## 2023-08-19 NOTE — Telephone Encounter (Signed)
Called to arrange Watchman consult with Dr. Lalla Brothers. The patient is adamant about not having any procedures done and declined to schedule consult. Gave her direct number to call to schedule should she change her mind.

## 2023-08-19 NOTE — Telephone Encounter (Signed)
Thanks - Jasmin Miller.  Sorry about that - but given her Afib, recent severe anemia,  and Hx of GIB needing PRBCs  long term OAC would be less than ideal. She was agreeable for watchman evaluation at the last visit and during the last hospitalization. Not sure what changed her mind.  Please document our effort in providing the best / standard of care to her.  Will talk to her at the next visit.   [name] [place], DO, [facility]

## 2023-08-20 ENCOUNTER — Other Ambulatory Visit: Payer: 59

## 2023-09-14 ENCOUNTER — Other Ambulatory Visit: Payer: Self-pay | Admitting: Cardiology

## 2023-09-14 DIAGNOSIS — I480 Paroxysmal atrial fibrillation: Secondary | ICD-10-CM

## 2023-09-14 DIAGNOSIS — I5022 Chronic systolic (congestive) heart failure: Secondary | ICD-10-CM

## 2023-10-26 ENCOUNTER — Other Ambulatory Visit: Payer: Self-pay | Admitting: Cardiology

## 2023-10-26 DIAGNOSIS — I5022 Chronic systolic (congestive) heart failure: Secondary | ICD-10-CM

## 2023-11-09 ENCOUNTER — Ambulatory Visit: Payer: 59 | Admitting: Cardiology

## 2023-11-12 ENCOUNTER — Other Ambulatory Visit: Payer: Self-pay | Admitting: Cardiology

## 2023-11-12 DIAGNOSIS — I5022 Chronic systolic (congestive) heart failure: Secondary | ICD-10-CM

## 2023-11-17 ENCOUNTER — Other Ambulatory Visit: Payer: Self-pay | Admitting: Cardiology

## 2023-11-17 DIAGNOSIS — I480 Paroxysmal atrial fibrillation: Secondary | ICD-10-CM

## 2023-12-12 ENCOUNTER — Emergency Department (HOSPITAL_COMMUNITY): Payer: 59

## 2023-12-12 ENCOUNTER — Inpatient Hospital Stay (HOSPITAL_COMMUNITY)
Admission: EM | Admit: 2023-12-12 | Discharge: 2024-01-09 | DRG: 811 | Disposition: E | Payer: 59 | Attending: Pulmonary Disease | Admitting: Pulmonary Disease

## 2023-12-12 DIAGNOSIS — Y95 Nosocomial condition: Secondary | ICD-10-CM | POA: Diagnosis not present

## 2023-12-12 DIAGNOSIS — E440 Moderate protein-calorie malnutrition: Secondary | ICD-10-CM | POA: Diagnosis present

## 2023-12-12 DIAGNOSIS — D329 Benign neoplasm of meninges, unspecified: Secondary | ICD-10-CM | POA: Diagnosis present

## 2023-12-12 DIAGNOSIS — J9601 Acute respiratory failure with hypoxia: Secondary | ICD-10-CM

## 2023-12-12 DIAGNOSIS — I5022 Chronic systolic (congestive) heart failure: Secondary | ICD-10-CM | POA: Diagnosis present

## 2023-12-12 DIAGNOSIS — D72829 Elevated white blood cell count, unspecified: Secondary | ICD-10-CM | POA: Diagnosis present

## 2023-12-12 DIAGNOSIS — G9341 Metabolic encephalopathy: Secondary | ICD-10-CM | POA: Diagnosis present

## 2023-12-12 DIAGNOSIS — D62 Acute posthemorrhagic anemia: Secondary | ICD-10-CM | POA: Diagnosis present

## 2023-12-12 DIAGNOSIS — E870 Hyperosmolality and hypernatremia: Secondary | ICD-10-CM | POA: Diagnosis not present

## 2023-12-12 DIAGNOSIS — G931 Anoxic brain damage, not elsewhere classified: Secondary | ICD-10-CM | POA: Diagnosis not present

## 2023-12-12 DIAGNOSIS — J189 Pneumonia, unspecified organism: Secondary | ICD-10-CM | POA: Diagnosis not present

## 2023-12-12 DIAGNOSIS — Z823 Family history of stroke: Secondary | ICD-10-CM

## 2023-12-12 DIAGNOSIS — K7200 Acute and subacute hepatic failure without coma: Secondary | ICD-10-CM | POA: Diagnosis not present

## 2023-12-12 DIAGNOSIS — I468 Cardiac arrest due to other underlying condition: Secondary | ICD-10-CM | POA: Diagnosis present

## 2023-12-12 DIAGNOSIS — Z66 Do not resuscitate: Secondary | ICD-10-CM | POA: Diagnosis present

## 2023-12-12 DIAGNOSIS — N1832 Chronic kidney disease, stage 3b: Secondary | ICD-10-CM | POA: Diagnosis present

## 2023-12-12 DIAGNOSIS — R0600 Dyspnea, unspecified: Secondary | ICD-10-CM | POA: Diagnosis not present

## 2023-12-12 DIAGNOSIS — I4891 Unspecified atrial fibrillation: Secondary | ICD-10-CM | POA: Diagnosis present

## 2023-12-12 DIAGNOSIS — Z7901 Long term (current) use of anticoagulants: Secondary | ICD-10-CM | POA: Diagnosis not present

## 2023-12-12 DIAGNOSIS — N179 Acute kidney failure, unspecified: Secondary | ICD-10-CM | POA: Diagnosis present

## 2023-12-12 DIAGNOSIS — Z79899 Other long term (current) drug therapy: Secondary | ICD-10-CM

## 2023-12-12 DIAGNOSIS — E872 Acidosis, unspecified: Secondary | ICD-10-CM | POA: Diagnosis present

## 2023-12-12 DIAGNOSIS — E1122 Type 2 diabetes mellitus with diabetic chronic kidney disease: Secondary | ICD-10-CM | POA: Diagnosis present

## 2023-12-12 DIAGNOSIS — Z8673 Personal history of transient ischemic attack (TIA), and cerebral infarction without residual deficits: Secondary | ICD-10-CM

## 2023-12-12 DIAGNOSIS — R578 Other shock: Secondary | ICD-10-CM | POA: Diagnosis present

## 2023-12-12 DIAGNOSIS — Z1152 Encounter for screening for COVID-19: Secondary | ICD-10-CM | POA: Diagnosis not present

## 2023-12-12 DIAGNOSIS — I469 Cardiac arrest, cause unspecified: Secondary | ICD-10-CM | POA: Diagnosis not present

## 2023-12-12 DIAGNOSIS — Z515 Encounter for palliative care: Secondary | ICD-10-CM | POA: Diagnosis not present

## 2023-12-12 DIAGNOSIS — D696 Thrombocytopenia, unspecified: Secondary | ICD-10-CM | POA: Diagnosis present

## 2023-12-12 DIAGNOSIS — I130 Hypertensive heart and chronic kidney disease with heart failure and stage 1 through stage 4 chronic kidney disease, or unspecified chronic kidney disease: Secondary | ICD-10-CM | POA: Diagnosis present

## 2023-12-12 DIAGNOSIS — Z90710 Acquired absence of both cervix and uterus: Secondary | ICD-10-CM

## 2023-12-12 DIAGNOSIS — R0602 Shortness of breath: Secondary | ICD-10-CM | POA: Diagnosis present

## 2023-12-12 DIAGNOSIS — Z6828 Body mass index (BMI) 28.0-28.9, adult: Secondary | ICD-10-CM

## 2023-12-12 DIAGNOSIS — Z86011 Personal history of benign neoplasm of the brain: Secondary | ICD-10-CM

## 2023-12-12 DIAGNOSIS — R531 Weakness: Secondary | ICD-10-CM | POA: Diagnosis present

## 2023-12-12 DIAGNOSIS — R579 Shock, unspecified: Secondary | ICD-10-CM | POA: Diagnosis not present

## 2023-12-12 DIAGNOSIS — Z7984 Long term (current) use of oral hypoglycemic drugs: Secondary | ICD-10-CM

## 2023-12-12 DIAGNOSIS — Z87891 Personal history of nicotine dependence: Secondary | ICD-10-CM

## 2023-12-12 LAB — BLOOD GAS, ARTERIAL
Acid-base deficit: 20 mmol/L — ABNORMAL HIGH (ref 0.0–2.0)
Acid-base deficit: 15.3 mmol/L — ABNORMAL HIGH (ref 0.0–2.0)
Bicarbonate: 10.9 mmol/L — ABNORMAL LOW (ref 20.0–28.0)
Bicarbonate: 14.3 mmol/L — ABNORMAL LOW (ref 20.0–28.0)
Drawn by: 20012
Drawn by: 59133
FIO2: 60 %
FIO2: 50 %
MECHVT: 380 mL
MECHVT: 380 mL
O2 Content: 60 L/min
O2 Saturation: 99.6 %
O2 Saturation: 100 %
PEEP: 5 cmH2O
PEEP: 5 cmH2O
Patient temperature: 37
Patient temperature: 33.8
RATE: 18 {breaths}/min
RATE: 18 {breaths}/min
pCO2 arterial: 44 mm[Hg] (ref 32–48)
pCO2 arterial: 41 mm[Hg] (ref 32–48)
pH, Arterial: 7 — CL (ref 7.35–7.45)
pH, Arterial: 7.13 — CL (ref 7.35–7.45)
pO2, Arterial: 201 mm[Hg] — ABNORMAL HIGH (ref 83–108)
pO2, Arterial: 166 mm[Hg] — ABNORMAL HIGH (ref 83–108)

## 2023-12-12 LAB — PREPARE RBC (CROSSMATCH)

## 2023-12-12 LAB — URINALYSIS, ROUTINE W REFLEX MICROSCOPIC
Bacteria, UA: NONE SEEN
Bilirubin Urine: NEGATIVE
Glucose, UA: >=500 mg/dL — AB
Ketones, ur: NEGATIVE mg/dL
Leukocytes,Ua: NEGATIVE
Nitrite: NEGATIVE
Protein, ur: 100 mg/dL — AB
Specific Gravity, Urine: 1.015 (ref 1.005–1.030)
pH: 5 (ref 5.0–8.0)

## 2023-12-12 LAB — BASIC METABOLIC PANEL
Anion gap: 19 — ABNORMAL HIGH (ref 5–15)
BUN: 19 mg/dL (ref 8–23)
CO2: 7 mmol/L — ABNORMAL LOW (ref 22–32)
Calcium: 8.9 mg/dL (ref 8.9–10.3)
Chloride: 112 mmol/L — ABNORMAL HIGH (ref 98–111)
Creatinine, Ser: 1.37 mg/dL — ABNORMAL HIGH (ref 0.44–1.00)
GFR, Estimated: 39 mL/min — ABNORMAL LOW (ref >60)
Glucose, Bld: 238 mg/dL — ABNORMAL HIGH (ref 70–99)
Potassium: 6 mmol/L — ABNORMAL HIGH (ref 3.5–5.1)
Sodium: 138 mmol/L (ref 135–145)

## 2023-12-12 LAB — BLOOD GAS, VENOUS
Acid-base deficit: 20.5 mmol/L — ABNORMAL HIGH (ref 0.0–2.0)
Bicarbonate: 7.5 mmol/L — ABNORMAL LOW (ref 20.0–28.0)
O2 Saturation: 82.1 %
Patient temperature: 37
pCO2, Ven: 24 mm[Hg] — ABNORMAL LOW (ref 44–60)
pH, Ven: 7.07 — CL (ref 7.25–7.43)
pO2, Ven: 51 mm[Hg] — ABNORMAL HIGH (ref 32–45)

## 2023-12-12 LAB — CBC WITH DIFFERENTIAL/PLATELET
Abs Immature Granulocytes: 0.04 10*3/uL (ref 0.00–0.07)
Basophils Absolute: 0 10*3/uL (ref 0.0–0.1)
Basophils Relative: 0 %
Eosinophils Absolute: 0.1 10*3/uL (ref 0.0–0.5)
Eosinophils Relative: 2 %
HCT: 15.7 % — ABNORMAL LOW (ref 36.0–46.0)
Hemoglobin: 4 g/dL — CL (ref 12.0–15.0)
Immature Granulocytes: 1 %
Lymphocytes Relative: 10 %
Lymphs Abs: 0.6 10*3/uL — ABNORMAL LOW (ref 0.7–4.0)
MCH: 20.3 pg — ABNORMAL LOW (ref 26.0–34.0)
MCHC: 25.5 g/dL — ABNORMAL LOW (ref 30.0–36.0)
MCV: 79.7 fL — ABNORMAL LOW (ref 80.0–100.0)
Monocytes Absolute: 0.3 10*3/uL (ref 0.1–1.0)
Monocytes Relative: 5 %
Neutro Abs: 4.9 10*3/uL (ref 1.7–7.7)
Neutrophils Relative %: 82 %
Platelets: 246 10*3/uL (ref 150–400)
RBC: 1.97 MIL/uL — ABNORMAL LOW (ref 3.87–5.11)
RDW: 20.2 % — ABNORMAL HIGH (ref 11.5–15.5)
WBC: 6 10*3/uL (ref 4.0–10.5)
nRBC: 12.5 % — ABNORMAL HIGH (ref 0.0–0.2)

## 2023-12-12 LAB — I-STAT CG4 LACTIC ACID, ED: Lactic Acid, Venous: 13.2 mmol/L (ref 0.5–1.9)

## 2023-12-12 LAB — TROPONIN I (HIGH SENSITIVITY)
Troponin I (High Sensitivity): 133 ng/L (ref <18)
Troponin I (High Sensitivity): 143 ng/L (ref <18)
Troponin I (High Sensitivity): 691 ng/L (ref <18)

## 2023-12-12 LAB — RESP PANEL BY RT-PCR (RSV, FLU A&B, COVID)  RVPGX2
Influenza A by PCR: NEGATIVE
Influenza B by PCR: NEGATIVE
Resp Syncytial Virus by PCR: NEGATIVE
SARS Coronavirus 2 by RT PCR: NEGATIVE

## 2023-12-12 LAB — GLUCOSE, CAPILLARY: Glucose-Capillary: 125 mg/dL — ABNORMAL HIGH (ref 70–99)

## 2023-12-12 LAB — POC OCCULT BLOOD, ED: Fecal Occult Bld: POSITIVE — AB

## 2023-12-12 LAB — PROTIME-INR
INR: 1.8 — ABNORMAL HIGH (ref 0.8–1.2)
Prothrombin Time: 20.8 s — ABNORMAL HIGH (ref 11.4–15.2)

## 2023-12-12 LAB — BRAIN NATRIURETIC PEPTIDE: B Natriuretic Peptide: 2599.7 pg/mL — ABNORMAL HIGH (ref 0.0–100.0)

## 2023-12-12 LAB — CBG MONITORING, ED: Glucose-Capillary: 266 mg/dL — ABNORMAL HIGH (ref 70–99)

## 2023-12-12 LAB — D-DIMER, QUANTITATIVE: D-Dimer, Quant: 0.49 ug{FEU}/mL (ref 0.00–0.50)

## 2023-12-12 MED ORDER — EPINEPHRINE HCL 5 MG/250ML IV SOLN IN NS: 0.5000 ug/min | INTRAVENOUS | Status: DC

## 2023-12-12 MED ORDER — ETOMIDATE 2 MG/ML IV SOLN
INTRAVENOUS | Status: AC
  Administered 2023-12-12: 10 mg
  Filled 2023-12-12: qty 10

## 2023-12-12 MED ORDER — SODIUM CHLORIDE 0.9% IV SOLUTION: Freq: Once | INTRAVENOUS | Status: AC

## 2023-12-12 MED ORDER — NOREPINEPHRINE 4 MG/250ML-% IV SOLN
2.0000 ug/min | INTRAVENOUS | Status: DC
  Administered 2023-12-12: 10 ug/min via INTRAVENOUS
  Administered 2023-12-13: 3 ug/min via INTRAVENOUS
  Administered 2023-12-14: 5 ug/min via INTRAVENOUS
  Filled 2023-12-12 (×3): qty 250

## 2023-12-12 MED ORDER — FAMOTIDINE 20 MG PO TABS
20.0000 mg | ORAL_TABLET | Freq: Two times a day (BID) | ORAL | Status: DC
  Administered 2023-12-13 – 2023-12-14 (×3): 20 mg
  Filled 2023-12-12 (×3): qty 1

## 2023-12-12 MED ORDER — ROCURONIUM BROMIDE 10 MG/ML (PF) SYRINGE
PREFILLED_SYRINGE | INTRAVENOUS | Status: AC
  Administered 2023-12-12: 100 mg
  Filled 2023-12-12: qty 10

## 2023-12-12 MED ORDER — INSULIN ASPART 100 UNIT/ML IJ SOLN
0.0000 [IU] | INTRAMUSCULAR | Status: DC
  Administered 2023-12-13 – 2023-12-14 (×5): 2 [IU] via SUBCUTANEOUS
  Administered 2023-12-14 (×2): 1 [IU] via SUBCUTANEOUS
  Administered 2023-12-14 (×2): 2 [IU] via SUBCUTANEOUS
  Administered 2023-12-14 – 2023-12-18 (×15): 1 [IU] via SUBCUTANEOUS
  Administered 2023-12-18: 2 [IU] via SUBCUTANEOUS
  Administered 2023-12-18 (×2): 1 [IU] via SUBCUTANEOUS
  Administered 2023-12-18: 2 [IU] via SUBCUTANEOUS
  Administered 2023-12-18 – 2023-12-19 (×2): 1 [IU] via SUBCUTANEOUS
  Administered 2023-12-19: 2 [IU] via SUBCUTANEOUS
  Administered 2023-12-19: 1 [IU] via SUBCUTANEOUS
  Administered 2023-12-19 – 2023-12-20 (×5): 2 [IU] via SUBCUTANEOUS
  Administered 2023-12-20: 1 [IU] via SUBCUTANEOUS
  Administered 2023-12-20 (×2): 2 [IU] via SUBCUTANEOUS
  Administered 2023-12-21: 1 [IU] via SUBCUTANEOUS
  Administered 2023-12-21: 2 [IU] via SUBCUTANEOUS
  Administered 2023-12-21: 1 [IU] via SUBCUTANEOUS
  Administered 2023-12-21: 2 [IU] via SUBCUTANEOUS
  Administered 2023-12-21: 1 [IU] via SUBCUTANEOUS
  Administered 2023-12-21: 2 [IU] via SUBCUTANEOUS
  Administered 2023-12-22 – 2023-12-24 (×12): 1 [IU] via SUBCUTANEOUS
  Administered 2023-12-24: 2 [IU] via SUBCUTANEOUS
  Administered 2023-12-24 – 2023-12-25 (×4): 1 [IU] via SUBCUTANEOUS
  Filled 2023-12-12: qty 0.09

## 2023-12-12 MED ORDER — SODIUM CHLORIDE 0.9 % IV SOLN: 250.0000 mL | INTRAVENOUS | Status: AC

## 2023-12-12 MED ORDER — PHENYLEPHRINE 80 MCG/ML (10ML) SYRINGE FOR IV PUSH (FOR BLOOD PRESSURE SUPPORT)
PREFILLED_SYRINGE | INTRAVENOUS | Status: AC
  Administered 2023-12-12: 800 ug
  Filled 2023-12-12: qty 10

## 2023-12-12 MED ORDER — NOREPINEPHRINE 4 MG/250ML-% IV SOLN
INTRAVENOUS | Status: AC
  Administered 2023-12-12: 4 mg via INTRAVENOUS
  Filled 2023-12-12: qty 250

## 2023-12-12 MED ORDER — PHENYLEPHRINE 80 MCG/ML (10ML) SYRINGE FOR IV PUSH (FOR BLOOD PRESSURE SUPPORT)
80.0000 ug | PREFILLED_SYRINGE | Freq: Once | INTRAVENOUS | Status: AC | PRN
  Administered 2023-12-12: 800 ug via INTRAVENOUS

## 2023-12-12 MED ORDER — SODIUM CHLORIDE 0.9 % IV BOLUS
1000.0000 mL | Freq: Once | INTRAVENOUS | Status: AC
  Administered 2023-12-12: 1000 mL via INTRAVENOUS

## 2023-12-12 MED ORDER — DOCUSATE SODIUM 100 MG PO CAPS: 100.0000 mg | ORAL_CAPSULE | Freq: Two times a day (BID) | ORAL | Status: DC | PRN

## 2023-12-12 MED ORDER — STERILE WATER FOR INJECTION IV SOLN
INTRAVENOUS | Status: AC
  Filled 2023-12-12: qty 150

## 2023-12-12 MED ORDER — PHENYLEPHRINE 80 MCG/ML (10ML) SYRINGE FOR IV PUSH (FOR BLOOD PRESSURE SUPPORT)
PREFILLED_SYRINGE | INTRAVENOUS | Status: AC
  Filled 2023-12-12: qty 10

## 2023-12-12 MED ORDER — POLYETHYLENE GLYCOL 3350 17 G PO PACK: 17.0000 g | PACK | Freq: Every day | ORAL | Status: DC | PRN

## 2023-12-12 MED ORDER — EPINEPHRINE HCL 5 MG/250ML IV SOLN IN NS
INTRAVENOUS | Status: AC
  Administered 2023-12-12: 5 mg
  Filled 2023-12-12: qty 250

## 2023-12-12 NOTE — Progress Notes (Signed)
 ABG collected and send down to lab for analysis. Lab notified.

## 2023-12-12 NOTE — ED Triage Notes (Signed)
 Per EMS from home. Pt states SOB since yesterday. Hx prediabetes. Hx of afib.  BP 110/62 RR 26 HR 58-100 CBG 354 O2 94% RA

## 2023-12-12 NOTE — ED Provider Notes (Signed)
 5:16 PM Care of the patient at signout.  Was called to bedside as the patient remains hypotensive, now with critical lab value anemia, hemoglobin 4. Patient also found to have lactic acidosis, greater than 13.  Point-of-care bedside Hemoccult performed, positive, though grossly negative.  With history of prior GI bleed, as well as ongoing Eliquis  use, patient written for emergency release transfusion.  With persistent obtunded status stat head CT ordered.   On CT with persistent obtunded status patient was intubated.  INTUBATION Performed by: Lamar Salen  Required items: required blood products, implants, devices, and special equipment available Patient identity confirmed: provided demographic data and hospital-assigned identification number Time out: Immediately prior to procedure a time out was called to verify the correct patient, procedure, equipment, support staff and site/side marked as required.  Indications: airway protection  Intubation method: Glidescope Laryngoscopy   Preoxygenation: BVM  Sedatives: 20 Etomidate  Paralytic: 100 Rocuronium   Tube Size: 7.5 cuffed  Post-procedure assessment: chest rise and ETCO2 monitor Breath sounds: equal and absent over the epigastrium Tube secured with: ETT holder Chest x-ray interpreted by radiologist and me.  Chest x-ray findings: endotracheal tube in appropriate position  Patient tolerated the procedure well with no immediate complications.  After intubation I discussed patient's case with critical care team at bedside.  Following intubation, and initiation of emergency release blood patient continued to decline, requiring initiation of pressors.  After bradycardia with loss of pulses patient had CPR.  With continued CPR, initiation of pressors, blood I discussed the patient's situation with her family member several times, and family witnessed resuscitation as well.  Patient eventually plateaued, with MAP 58/60, ongoing  epinephrine , norepinephrine , blood resuscitation.  She continued to exhibit no return to neurologic spontaneous activity.  At length conversation with the patient's family about her condition, critical illness.  They agree patient will continue medical interventions, no additional CPR at this point.    9:04 PM   I discussed the patient's case with the critical care team again for admission.  CRITICAL CARE Performed by: Lamar Salen Total critical care time: 75 minutes Critical care time was exclusive of separately billable procedures and treating other patients. Critical care was necessary to treat or prevent imminent or life-threatening deterioration. Critical care was time spent personally by me on the following activities: development of treatment plan with patient and/or surrogate as well as nursing, discussions with consultants, evaluation of patient's response to treatment, examination of patient, obtaining history from patient or surrogate, ordering and performing treatments and interventions, ordering and review of laboratory studies, ordering and review of radiographic studies, pulse oximetry and re-evaluation of patient's condition.    Salen Lamar, MD 12/12/23 2104

## 2023-12-12 NOTE — ED Notes (Signed)
 Samuel Bouche removed from patient, critical care provider at bedside with family

## 2023-12-12 NOTE — Progress Notes (Signed)
 Pt transported on the vent with 2 RN,s to ICU 1224 without any complications. Pt placed back on previous vent settings.

## 2023-12-12 NOTE — ED Notes (Signed)
 Pt rectal temp 92.8, placed pt on bare huger warmer, MD notified.

## 2023-12-12 NOTE — ED Provider Notes (Signed)
  EMERGENCY DEPARTMENT AT [facility] Provider Note   CSN: [id number] Arrival date & time: 12/12/23  1426     History  Chief Complaint  Patient presents with   Shortness of Breath    Jasmin Miller is a 83 y.o. female.  Pt is a 83 yo female with pmhx significant for htn, dm, anemia, and afib (on Eliquis ).  Pt is a poor historian, but has been sob since yesterday.  Pt has not had fever.  EMS could not get an IV, so she was not given anything en route.         Home Medications Prior to Admission medications   Medication Sig Start Date End Date Taking? Authorizing Provider  apixaban  (ELIQUIS ) 2.5 MG TABS tablet Take 1 tablet (2.5 mg total) by mouth 2 (two) times daily. 08/07/23 11/05/23  Tolia, Sunit, DO  cyanocobalamin  1000 MCG tablet Take 1 tablet (1,000 mcg total) by mouth daily. Patient not taking: Reported on 08/07/2023 07/07/23   Lonnie Earnest, MD  dronedarone  (MULTAQ ) 400 MG tablet Take 1 tablet (400 mg total) by mouth 2 (two) times daily with a meal. 08/07/23 11/05/23  Tolia, Sunit, DO  empagliflozin  (JARDIANCE ) 10 MG TABS tablet TAKE 1 TABLET(10 MG) BY MOUTH DAILY 11/13/23   Tolia, Sunit, DO  Ferrous Sulfate  Dried (HIGH POTENCY IRON ) 65 MG TABS Take 1 capsule by mouth daily. Patient not taking: Reported on 08/07/2023    [provider]  metFORMIN  (GLUCOPHAGE -XR) 500 MG 24 hr tablet Take 500 mg by mouth every evening. 04/02/23   [provider]  metoprolol  tartrate (LOPRESSOR ) 100 MG tablet TAKE 1 TABLET BY MOUTH TWICE DAILY. HOLD IF SYSTOLIC BLOOD PRESSURE( TOP NUMBER) LESS THAN 100 MMHG OR PULSE LESS THAN 55 BPM 11/18/23   Tolia, Sunit, DO  pantoprazole  (PROTONIX ) 40 MG tablet Take 40 mg by mouth daily.    [provider]  rosuvastatin  (CRESTOR ) 10 MG tablet Take 1 tablet (10 mg total) by mouth daily. 08/23/21   Cantwell, Celeste C, PA-C  spironolactone  (ALDACTONE ) 25 MG tablet TAKE 1/2 TABLET(12.5 MG) BY MOUTH IN THE MORNING  10/27/23   Tolia, Sunit, DO  triamcinolone  cream (KENALOG ) 0.1 % Apply 1 application topically every 8 (eight) hours as needed (itching).     [provider]      Allergies    Patient has no known allergies.    Review of Systems   Review of Systems  Respiratory:  Positive for shortness of breath.   All other systems reviewed and are negative.   Physical Exam Updated Vital Signs BP 104/60 (BP Location: Left Arm)   Pulse 63   Temp (!) 97.4 F (36.3 C) (Rectal)   Resp (!) 34   SpO2 100%  Physical Exam Vitals and nursing note reviewed.  Constitutional:      Appearance: She is well-developed.  HENT:     Head: Normocephalic and atraumatic.     Mouth/Throat:     Mouth: Mucous membranes are moist.     Pharynx: Oropharynx is clear.  Eyes:     Extraocular Movements: Extraocular movements intact.     Pupils: Pupils are equal, round, and reactive to light.  Cardiovascular:     Rate and Rhythm: Normal rate. Rhythm irregular.  Pulmonary:     Effort: Tachypnea present.  Abdominal:     General: Bowel sounds are normal.     Palpations: Abdomen is soft.  Musculoskeletal:        General: Normal range of  motion.     Cervical back: Normal range of motion and neck supple.  Skin:    General: Skin is warm.     Capillary Refill: Capillary refill takes less than 2 seconds.  Neurological:     General: No focal deficit present.     Mental Status: She is alert and oriented to person, place, and time.  Psychiatric:        Mood and Affect: Mood normal.        Behavior: Behavior normal.     ED Results / Procedures / Treatments   Labs (all labs ordered are listed, but only abnormal results are displayed) Labs Reviewed  CBG MONITORING, ED - Abnormal; Notable for the following components:      Result Value   Glucose-Capillary 266 (*)    All other components within normal limits  RESP PANEL BY RT-PCR (RSV, FLU A&B, COVID)  [id number]  CULTURE, BLOOD (ROUTINE X 2)  CULTURE, BLOOD  (ROUTINE X 2)  BASIC METABOLIC PANEL  BRAIN NATRIURETIC PEPTIDE  CBC WITH DIFFERENTIAL/PLATELET  HEMOGLOBIN A1C  BLOOD GAS, VENOUS  D-DIMER, QUANTITATIVE  I-STAT CG4 LACTIC ACID, ED  TROPONIN I (HIGH SENSITIVITY)    EKG EKG Interpretation Date/Time:  Saturday December 12 2023 14:38:03 EST Ventricular Rate:  63 PR Interval:  125 QRS Duration:  91 QT Interval:  469 QTC Calculation: 481 R Axis:   148  Text Interpretation: Atrial fibrillation Lateral infarct, age indeterminate Confirmed by Dean Clarity [phone]) on 12/12/2023 3:22:50 PM  Radiology No results found.  Procedures Procedures    Medications Ordered in ED Medications - No data to display  ED Course/ Medical Decision Making/ A&P                                 Medical Decision Making Amount and/or Complexity of Data Reviewed Labs: ordered. Radiology: ordered.   This patient presents to the ED for concern of sob, this involves an extensive number of treatment options, and is a complaint that carries with it a high risk of complications and morbidity.  The differential diagnosis includes pna, covid/flu/rsv, pe, bronchitis, cardiac   Co morbidities that complicate the patient evaluation   htn, dm, anemia, and afib (on Eliquis )   Additional history obtained:  Additional history obtained from epic chart review External records from outside source obtained and reviewed including EMS report   Lab Tests:  I Ordered, and personally interpreted labs.  The pertinent results include:  pending at shift change   Imaging Studies ordered:  I ordered imaging studies including cxr  Pending at shift change   Cardiac Monitoring:  The patient was maintained on a cardiac monitor.  I personally viewed and interpreted the cardiac monitored which showed an underlying rhythm of: af   Medicines ordered and prescription drug management:   I have reviewed the patients home medicines and have made adjustments as  needed   Test Considered:  ct   Problem List / ED Course:  Sob:  labs pending at shift change.   Reevaluation:  After the interventions noted above, I reevaluated the patient and found that they have :improved   Social Determinants of Health:  Lives at home   Dispostion: Pending at shift change        Final Clinical Impression(s) / ED Diagnoses Final diagnoses:  Dyspnea, unspecified type    Rx / DC Orders ED Discharge Orders     None  Dean Clarity, MD 12/12/23 [phone]

## 2023-12-12 NOTE — Progress Notes (Addendum)
 eLink Physician-Brief Progress Note Patient Name: Jasmin Miller DOB: [birth year] MRN: [id number]   Date of Service  12/12/2023  HPI/Events of Note  Asked to place orders for peripheral levophed  and peripheral epi that has been infusing since patient came from ER. Currently on 10 mic of both. SBP is in 130s. Dr Kara had seen the patient in the ICU and patient is getting the 4th unit of RBC and is ordered for FFP as well. Plan to have a CVC placed if unable to wean pressor however HR is in 70s and SBP of 130s so I asked RN to wean down epi first .   eICU Interventions  Wean pressors Repeat labs once transfusion completed Is also on IV bicarb Call E link if needed     Intervention Category Major Interventions: Respiratory failure - evaluation and management;Shock - evaluation and management  Jasmin Miller Jasmin Miller 12/12/2023, 11:31 PM  Addendum at 445 am - CBC and bmp notified. Improved H/H, K and bicarb as well as lactate. ABG ordered. Repeat labs ordered for 9 am   Addendum at 605 am - PRN fentanyl  requested for some coughing and discomfort on vent. Ordered.

## 2023-12-12 NOTE — H&P (Signed)
 NAME:  Jasmin Miller, MRN:  [id number], DOB:  19-Aug-1941, LOS: 0 ADMISSION DATE:  12/12/2023, CONSULTATION DATE:  12/12/23 REFERRING MD:  Lamar Salen, MD CHIEF COMPLAINT:  Hypotension, acute blood loss anemia   History of Present Illness:  83 year old female on with pmhx below who presented for shortness of breath x 1 day.  Labs significant for Hg 4, Plt 246, iSTAT LA 13, K 6.0, CO2 7 BUN/Cr 19/1.37. Trop 133. BNP 2599.  VBG 7.06/30/50/7.5.  CXR no infiltrate effusion or edema. Influenza, RSV and covid negative.  In the ED patient became hypotensive and loss of mental status. Levophed  gtt started. Patient intubated in the ED due to airway protection due to obtunded status. 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  Progressed to PEA Cardiac arrest required CPR x 10 min (estimated) and achieved ROSC in the ED. Placed on epi gtt. Additional ER interventions: PRBC x fusion (4 Units ordered), in addition to crystalloid resuscitation. Placed on Norepi, phenylephrine  and epi gtt. Cultures sent. EDP spoke to family. No further CPR but cont supportive care. Remained hypotensive and in low perfusion state post resuscitation PCCM asked to admit   Family at bedside. Patient has shown decline over past 2 months with poor oral intake, only eating soup. She has remained active in cooking and cleaning at home on her own. Has become very weak over the past 2 months.   Pertinent  Medical History  DM2, HTN, , CHF, IDA, atrial fibrillation on Eliquis   Significant Hospital Events: Including procedures, antibiotic start and stop dates in addition to other pertinent events   1/4 Presented w/ CC SOB, had lactate 13, hgb 4, BNP 2599. D-dimer negative While in ER had loss of LOC, became hypoxic, intubated. Had cardiac arrest. ACLS provided. Time to ROSC estimated ~10  minutes. Remained in low perfusion state in spite of blood, fluid resuscitation and pressors. Made DNR in ER should arrest occur again. PCCM asked to admit.   Interim History / Subjective:  As above  Objective   Blood pressure (!) 96/57, pulse 60, temperature (!) 92.8 F (33.8 C), temperature source Rectal, resp. rate 18, height 5' 1 (1.549 m), SpO2 100%.    Vent Mode: PRVC FiO2 (%):  [50 %-100 %] 50 % Set Rate:  [18 bmp] 18 bmp Vt Set:  [380 mL] 380 mL PEEP:  [5 cmH20] 5 cmH20 Plateau Pressure:  [13 cmH20-14 cmH20] 14 cmH20   Intake/Output Summary (Last 24 hours) at 12/12/2023 2129 Last data filed at 12/12/2023 2015 Gross per 24 hour  Intake 1315 ml  Output --  Net 1315 ml   There were no vitals filed for this visit.  Physical Exam: General: Critically ill-appearing, obtunded, intubated HENT: Krebs, AT, ETT in place Eyes: sclera anicteric Respiratory: Clear to auscultation bilaterally.  No crackles, wheezing or rales Cardiovascular: Bradycardia, -M/R/G, no JVD GI: BS+, soft, nontender Extremities: trace Edema,-tenderness Neuro: Unresponsive, PERRL  Resolved Hospital Problem list   N/A  Assessment & Plan:   S/p in-hospital cardiac arrest 2/2 severe anemia and metabolic acidosis Plan Tele  Trops  Echo Avoid fevers Attempt to keep MAP > 65 Transfuse  Keep euglycemic   Hemorrhagic shock/Acute blood loss anemia Lactic acidosis Plan Levophed  for MAP >65, currently on peripherally Complete 4 units PRBC. Consider additional PLT and FFP given large volume resuscitation  Will assess need for central  access once a majority of blood has been transfused Hold eliquis  and asa Serial chems Titrate pressors for MAP > 65 F/u cultures   AKI with metabolic acidosis Plan Start bicarb drip, 1L over 10hrs Monitor UOP/Cr Avoid nephrotoxic agents Maintain perfusion as noted above  Acute metabolic encephalopathy  Became progressively altered while in ER. Likely 2/2 to  profound anemia and acidosis. Further c/b cardiac arrest and low flow state raising concern for anoxic injury. Plan Supportive care Avoid fevers PAD protocol   Abnormal CT head. 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 This is nonspecific, but could represent a meningioma. Plan F/u out pt if survives   DM2 Plan CBG q4h with SSI  Atrial fibrillation/hx NSVT Chronic systolic heart failure (EF 40-45%) HTN Tele Hold antihypertensive agents: Lopressor , entresto  Hold diuretics: spironolactone  Hold eliquis  in setting of bleed  Best Practice (right click and Reselect all SmartList Selections daily)   Diet/type: NPO DVT prophylaxis other Pressure ulcer(s): pressure ulcer assessment deferred  GI prophylaxis: PPI Lines: N/A Foley:  Yes, and it is still needed Code Status:  full code Last date of multidisciplinary goals of care discussion [1/4 discussed with family at bedside, agree with DNR but want to continue aggressive care at this time]  Labs   CBC: Recent Labs  Lab 12/12/23 1543  WBC 6.0  NEUTROABS 4.9  HGB 4.0*  HCT 15.7*  MCV 79.7*  PLT 246    Basic Metabolic Panel: Recent Labs  Lab 12/12/23 1543  NA 138  K 6.0*  CL 112*  CO2 7*  GLUCOSE 238*  BUN 19  CREATININE 1.37*  CALCIUM  8.9   GFR: CrCl cannot be calculated (Unknown ideal weight.). Recent Labs  Lab 12/12/23 1543 12/12/23 1710  WBC 6.0  --   LATICACIDVEN  --  13.2*    Liver Function Tests: No results for input(s): AST, ALT, ALKPHOS, BILITOT, PROT, ALBUMIN in the last 168 hours. No results for input(s): LIPASE, AMYLASE in the last 168 hours. No results for input(s): AMMONIA in the last 168 hours.  ABG    Component Value Date/Time   PHART 7 (LL) 12/12/2023 1910    PCO2ART 44 12/12/2023 1910   PO2ART 201 (H) 12/12/2023 1910   HCO3 10.9 (L) 12/12/2023 1910   TCO2 25 04/19/2021 1149   ACIDBASEDEF 20.0 (H) 12/12/2023 1910   O2SAT 99.6 12/12/2023 1910     Coagulation Profile: No results for input(s): INR, PROTIME in the last 168 hours.  Cardiac Enzymes: No results for input(s): CKTOTAL, CKMB, CKMBINDEX, TROPONINI in the last 168 hours.  HbA1C: Hgb A1c MFr Bld  Date/Time Value Ref Range Status  06/28/2023 06:39 PM 6.5 (H) 4.8 - 5.6 % Final    Comment:    (NOTE) Pre diabetes:          5.7%-6.4%  Diabetes:              >6.4%  Glycemic control for   <7.0% adults with diabetes   04/18/2021 06:05 AM 6.3 (H) 4.8 - 5.6 % Final    Comment:    (NOTE)         Prediabetes: 5.7 - 6.4         Diabetes: >6.4         Glycemic control for adults with diabetes: <7.0  CBG: Recent Labs  Lab 12/12/23 1453  GLUCAP 266*    Review of Systems:   Unable to perform ROS due to being intubated.  Past Medical History:  She,  has a past medical history of DM2 (diabetes mellitus, type 2) (HCC), Hypertension, and Symptomatic anemia.   Surgical History:   Past Surgical History:  Procedure Laterality Date   ABDOMINAL HYSTERECTOMY     BIOPSY  07/12/2020   Procedure: BIOPSY;  Surgeon: Dianna Specking, MD;  Location: [facility] ENDOSCOPY;  Service: Endoscopy;;   COLONOSCOPY  07/12/2020   COLONOSCOPY WITH PROPOFOL  N/A 07/12/2020   Procedure: COLONOSCOPY WITH PROPOFOL ;  Surgeon: Dianna Specking, MD;  Location: [facility] ENDOSCOPY;  Service: Endoscopy;  Laterality: N/A;   ESOPHAGOGASTRODUODENOSCOPY  07/12/2020   ESOPHAGOGASTRODUODENOSCOPY N/A 07/12/2020   Procedure: ESOPHAGOGASTRODUODENOSCOPY (EGD);  Surgeon: Dianna Specking, MD;  Location: [facility] ENDOSCOPY;  Service: Endoscopy;  Laterality: N/A;   ESOPHAGOGASTRODUODENOSCOPY N/A 04/19/2021   Procedure: ESOPHAGOGASTRODUODENOSCOPY (EGD);  Surgeon: Dianna Specking, MD;  Location: [facility];  Service: Endoscopy;   Laterality: N/A;   ESOPHAGOGASTRODUODENOSCOPY (EGD) WITH PROPOFOL  Left 07/03/2023   Procedure: ESOPHAGOGASTRODUODENOSCOPY (EGD) WITH PROPOFOL ;  Surgeon: Burnette Fallow, MD;  Location: [facility] ENDOSCOPY;  Service: Gastroenterology;  Laterality: Left;   GIVENS CAPSULE STUDY N/A 07/03/2023   Procedure: GIVENS CAPSULE STUDY;  Surgeon: Burnette Fallow, MD;  Location[facility] ENDOSCOPY;  Service: Gastroenterology;  Laterality: N/A;   HOT HEMOSTASIS N/A 07/12/2020   Procedure: HOT HEMOSTASIS (ARGON PLASMA COAGULATION/BICAP);  Surgeon: Dianna Specking, MD;  Location: [facility] ENDOSCOPY;  Service: Endoscopy;  Laterality: N/A;  EGD and COLON     Social History:   reports that she quit smoking about 32 years ago. Her smoking use included cigarettes. She started smoking about 61 years ago. She has a 29 pack-year smoking history. She has never used smokeless tobacco. She reports that she does not drink alcohol  and does not use drugs.   Family History:  Her family history includes Anemia in her daughter and mother; Stroke in her mother.   Allergies No Known Allergies   Home Medications  Prior to Admission medications   Medication Sig Start Date End Date Taking? Authorizing Provider  apixaban  (ELIQUIS ) 2.5 MG TABS tablet Take 1 tablet (2.5 mg total) by mouth 2 (two) times daily. 08/07/23 11/05/23  Tolia, Sunit, DO  cyanocobalamin  1000 MCG tablet Take 1 tablet (1,000 mcg total) by mouth daily. Patient not taking: Reported on 08/07/2023 07/07/23   Baloch, Mahnoor, MD  dronedarone  (MULTAQ ) 400 MG tablet Take 1 tablet (400 mg total) by mouth 2 (two) times daily with a meal. 08/07/23 11/05/23  Tolia, Sunit, DO  empagliflozin  (JARDIANCE ) 10 MG TABS tablet TAKE 1 TABLET(10 MG) BY MOUTH DAILY 11/13/23   Tolia, Sunit, DO  Ferrous Sulfate  Dried (HIGH POTENCY IRON ) 65 MG TABS Take 1 capsule by mouth daily. Patient not taking: Reported on 08/07/2023    [provider]  metFORMIN  (GLUCOPHAGE -XR) 500 MG 24 hr tablet Take 500 mg by  mouth every evening. 04/02/23   [provider]  metoprolol  tartrate (LOPRESSOR ) 100 MG tablet TAKE 1 TABLET BY MOUTH TWICE DAILY. HOLD IF SYSTOLIC BLOOD PRESSURE( TOP NUMBER) LESS THAN 100 MMHG OR PULSE LESS THAN 55 BPM 11/18/23   Tolia, Sunit, DO  pantoprazole  (PROTONIX ) 40 MG tablet Take 40 mg by mouth daily.    [provider]  rosuvastatin  (CRESTOR ) 10 MG tablet Take 1 tablet (10 mg total) by mouth daily. 08/23/21   Cantwell, Celeste C, PA-C  spironolactone  (ALDACTONE ) 25 MG tablet  TAKE 1/2 TABLET(12.5 MG) BY MOUTH IN THE MORNING 10/27/23   Tolia, Sunit, DO  triamcinolone  cream (KENALOG ) 0.1 % Apply 1 application topically every 8 (eight) hours as needed (itching).     [provider]     Critical care time: 60 minutes    Dorn Chill, MD [facility] Office: [phone]   See Amion for personal pager PCCM on call pager [phone] until 7pm. Please call Elink 7p-7a. [phone]

## 2023-12-13 ENCOUNTER — Inpatient Hospital Stay (HOSPITAL_COMMUNITY): Payer: 59

## 2023-12-13 DIAGNOSIS — I469 Cardiac arrest, cause unspecified: Secondary | ICD-10-CM

## 2023-12-13 LAB — BLOOD GAS, ARTERIAL
Acid-base deficit: 8.3 mmol/L — ABNORMAL HIGH (ref 0.0–2.0)
Acid-base deficit: 3.6 mmol/L — ABNORMAL HIGH (ref 0.0–2.0)
Bicarbonate: 18.4 mmol/L — ABNORMAL LOW (ref 20.0–28.0)
Bicarbonate: 21.5 mmol/L (ref 20.0–28.0)
Drawn by: 11249
Drawn by: 331471
FIO2: 40 %
FIO2: 40 %
MECHVT: 380 mL
MECHVT: 380 mL
O2 Saturation: 100 %
O2 Saturation: 100 %
PEEP: 5 cmH2O
PEEP: 5 cmH2O
Patient temperature: 36.8
Patient temperature: 36.1
RATE: 18 {breaths}/min
RATE: 18 {breaths}/min
pCO2 arterial: 41 mm[Hg] (ref 32–48)
pCO2 arterial: 37 mm[Hg] (ref 32–48)
pH, Arterial: 7.26 — ABNORMAL LOW (ref 7.35–7.45)
pH, Arterial: 7.37 (ref 7.35–7.45)
pO2, Arterial: 142 mm[Hg] — ABNORMAL HIGH (ref 83–108)
pO2, Arterial: 128 mm[Hg] — ABNORMAL HIGH (ref 83–108)

## 2023-12-13 LAB — ECHOCARDIOGRAM LIMITED
Calc EF: 46.6 %
Height: 61 in
MV M vel: 5.26 m/s
MV Peak grad: 110.7 mm[Hg]
Radius: 0.5 cm
S' Lateral: 3.45 cm
Single Plane A2C EF: 46.2 %
Single Plane A4C EF: 47.4 %
Weight: 2183.44 [oz_av]

## 2023-12-13 LAB — CBC
HCT: 33.9 % — ABNORMAL LOW (ref 36.0–46.0)
HCT: 24.8 % — ABNORMAL LOW (ref 36.0–46.0)
HCT: 24.9 % — ABNORMAL LOW (ref 36.0–46.0)
HCT: 24.7 % — ABNORMAL LOW (ref 36.0–46.0)
Hemoglobin: 10.1 g/dL — ABNORMAL LOW (ref 12.0–15.0)
Hemoglobin: 8.1 g/dL — ABNORMAL LOW (ref 12.0–15.0)
Hemoglobin: 8 g/dL — ABNORMAL LOW (ref 12.0–15.0)
Hemoglobin: 7.7 g/dL — ABNORMAL LOW (ref 12.0–15.0)
MCH: 26.1 pg (ref 26.0–34.0)
MCH: 26.8 pg (ref 26.0–34.0)
MCH: 26.4 pg (ref 26.0–34.0)
MCH: 26 pg (ref 26.0–34.0)
MCHC: 29.8 g/dL — ABNORMAL LOW (ref 30.0–36.0)
MCHC: 32.7 g/dL (ref 30.0–36.0)
MCHC: 32.1 g/dL (ref 30.0–36.0)
MCHC: 31.2 g/dL (ref 30.0–36.0)
MCV: 87.6 fL (ref 80.0–100.0)
MCV: 82.1 fL (ref 80.0–100.0)
MCV: 82.2 fL (ref 80.0–100.0)
MCV: 83.4 fL (ref 80.0–100.0)
Platelets: 184 10*3/uL (ref 150–400)
Platelets: 124 10*3/uL — ABNORMAL LOW (ref 150–400)
Platelets: 109 10*3/uL — ABNORMAL LOW (ref 150–400)
Platelets: 102 10*3/uL — ABNORMAL LOW (ref 150–400)
RBC: 3.87 MIL/uL (ref 3.87–5.11)
RBC: 3.02 MIL/uL — ABNORMAL LOW (ref 3.87–5.11)
RBC: 3.03 MIL/uL — ABNORMAL LOW (ref 3.87–5.11)
RBC: 2.96 MIL/uL — ABNORMAL LOW (ref 3.87–5.11)
RDW: 21.1 % — ABNORMAL HIGH (ref 11.5–15.5)
RDW: 20.9 % — ABNORMAL HIGH (ref 11.5–15.5)
RDW: 21.2 % — ABNORMAL HIGH (ref 11.5–15.5)
RDW: 21.6 % — ABNORMAL HIGH (ref 11.5–15.5)
WBC: 29.5 10*3/uL — ABNORMAL HIGH (ref 4.0–10.5)
WBC: 20.1 10*3/uL — ABNORMAL HIGH (ref 4.0–10.5)
WBC: 18.7 10*3/uL — ABNORMAL HIGH (ref 4.0–10.5)
WBC: 16.7 10*3/uL — ABNORMAL HIGH (ref 4.0–10.5)
nRBC: 5.4 % — ABNORMAL HIGH (ref 0.0–0.2)
nRBC: 2.5 % — ABNORMAL HIGH (ref 0.0–0.2)
nRBC: 2.2 % — ABNORMAL HIGH (ref 0.0–0.2)
nRBC: 2.2 % — ABNORMAL HIGH (ref 0.0–0.2)

## 2023-12-13 LAB — MRSA NEXT GEN BY PCR, NASAL: MRSA by PCR Next Gen: NOT DETECTED

## 2023-12-13 LAB — BASIC METABOLIC PANEL
Anion gap: 14 (ref 5–15)
BUN: 27 mg/dL — ABNORMAL HIGH (ref 8–23)
CO2: 15 mmol/L — ABNORMAL LOW (ref 22–32)
Calcium: 8.1 mg/dL — ABNORMAL LOW (ref 8.9–10.3)
Chloride: 109 mmol/L (ref 98–111)
Creatinine, Ser: 1.66 mg/dL — ABNORMAL HIGH (ref 0.44–1.00)
GFR, Estimated: 31 mL/min — ABNORMAL LOW (ref >60)
Glucose, Bld: 178 mg/dL — ABNORMAL HIGH (ref 70–99)
Potassium: 5.3 mmol/L — ABNORMAL HIGH (ref 3.5–5.1)
Sodium: 138 mmol/L (ref 135–145)

## 2023-12-13 LAB — PHOSPHORUS
Phosphorus: 7.4 mg/dL — ABNORMAL HIGH (ref 2.5–4.6)
Phosphorus: 5.9 mg/dL — ABNORMAL HIGH (ref 2.5–4.6)
Phosphorus: 5.1 mg/dL — ABNORMAL HIGH (ref 2.5–4.6)

## 2023-12-13 LAB — COMPREHENSIVE METABOLIC PANEL
ALT: 341 U/L — ABNORMAL HIGH (ref 0–44)
AST: 715 U/L — ABNORMAL HIGH (ref 15–41)
Albumin: 3.2 g/dL — ABNORMAL LOW (ref 3.5–5.0)
Alkaline Phosphatase: 112 U/L (ref 38–126)
Anion gap: 11 (ref 5–15)
BUN: 31 mg/dL — ABNORMAL HIGH (ref 8–23)
CO2: 20 mmol/L — ABNORMAL LOW (ref 22–32)
Calcium: 7.8 mg/dL — ABNORMAL LOW (ref 8.9–10.3)
Chloride: 106 mmol/L (ref 98–111)
Creatinine, Ser: 1.8 mg/dL — ABNORMAL HIGH (ref 0.44–1.00)
GFR, Estimated: 28 mL/min — ABNORMAL LOW (ref >60)
Glucose, Bld: 135 mg/dL — ABNORMAL HIGH (ref 70–99)
Potassium: 4.4 mmol/L (ref 3.5–5.1)
Sodium: 137 mmol/L (ref 135–145)
Total Bilirubin: 1.3 mg/dL — ABNORMAL HIGH (ref 0.0–1.2)
Total Protein: 5.8 g/dL — ABNORMAL LOW (ref 6.5–8.1)

## 2023-12-13 LAB — GLUCOSE, CAPILLARY
Glucose-Capillary: 119 mg/dL — ABNORMAL HIGH (ref 70–99)
Glucose-Capillary: 152 mg/dL — ABNORMAL HIGH (ref 70–99)
Glucose-Capillary: 157 mg/dL — ABNORMAL HIGH (ref 70–99)
Glucose-Capillary: 162 mg/dL — ABNORMAL HIGH (ref 70–99)
Glucose-Capillary: 165 mg/dL — ABNORMAL HIGH (ref 70–99)
Glucose-Capillary: 169 mg/dL — ABNORMAL HIGH (ref 70–99)

## 2023-12-13 LAB — TROPONIN I (HIGH SENSITIVITY)
Troponin I (High Sensitivity): 2259 ng/L (ref <18)
Troponin I (High Sensitivity): 2475 ng/L (ref <18)
Troponin I (High Sensitivity): 1839 ng/L (ref <18)

## 2023-12-13 LAB — MAGNESIUM
Magnesium: 2.4 mg/dL (ref 1.7–2.4)
Magnesium: 2.3 mg/dL (ref 1.7–2.4)
Magnesium: 2.1 mg/dL (ref 1.7–2.4)

## 2023-12-13 LAB — LACTIC ACID, PLASMA
Lactic Acid, Venous: 3.8 mmol/L (ref 0.5–1.9)
Lactic Acid, Venous: 2.5 mmol/L (ref 0.5–1.9)

## 2023-12-13 MED ORDER — FENTANYL BOLUS VIA INFUSION
25.0000 ug | INTRAVENOUS | Status: DC | PRN
  Administered 2023-12-13: 25 ug via INTRAVENOUS

## 2023-12-13 MED ORDER — FENTANYL 2500MCG IN NS 250ML (10MCG/ML) PREMIX INFUSION
25.0000 ug/h | INTRAVENOUS | Status: DC
  Administered 2023-12-13: 25 ug/h via INTRAVENOUS
  Filled 2023-12-13: qty 250

## 2023-12-13 MED ORDER — ACETAMINOPHEN 160 MG/5ML PO SOLN
650.0000 mg | Freq: Four times a day (QID) | ORAL | Status: DC | PRN
  Administered 2023-12-13 – 2023-12-21 (×7): 650 mg
  Filled 2023-12-13 (×7): qty 20.3

## 2023-12-13 MED ORDER — DOCUSATE SODIUM 50 MG/5ML PO LIQD
100.0000 mg | Freq: Two times a day (BID) | ORAL | Status: DC
  Administered 2023-12-13 – 2023-12-23 (×17): 100 mg
  Filled 2023-12-13 (×17): qty 10

## 2023-12-13 MED ORDER — FENTANYL CITRATE PF 50 MCG/ML IJ SOSY: 25.0000 ug | PREFILLED_SYRINGE | INTRAMUSCULAR | Status: DC | PRN

## 2023-12-13 MED ORDER — FENTANYL CITRATE PF 50 MCG/ML IJ SOSY: 25.0000 ug | PREFILLED_SYRINGE | Freq: Once | INTRAMUSCULAR | Status: DC

## 2023-12-13 MED ORDER — FENTANYL CITRATE PF 50 MCG/ML IJ SOSY
25.0000 ug | PREFILLED_SYRINGE | INTRAMUSCULAR | Status: DC | PRN
  Administered 2023-12-13: 25 ug via INTRAVENOUS
  Filled 2023-12-13: qty 1

## 2023-12-13 MED ORDER — FENTANYL CITRATE PF 50 MCG/ML IJ SOSY
25.0000 ug | PREFILLED_SYRINGE | INTRAMUSCULAR | Status: DC | PRN
  Administered 2023-12-13: 75 ug via INTRAVENOUS
  Administered 2023-12-13: 100 ug via INTRAVENOUS
  Administered 2023-12-13: 50 ug via INTRAVENOUS
  Administered 2023-12-13: 100 ug via INTRAVENOUS
  Administered 2023-12-13 (×2): 50 ug via INTRAVENOUS
  Administered 2023-12-13: 75 ug via INTRAVENOUS
  Filled 2023-12-13: qty 2
  Filled 2023-12-13: qty 1
  Filled 2023-12-13: qty 2
  Filled 2023-12-13: qty 1
  Filled 2023-12-13: qty 2
  Filled 2023-12-13: qty 1
  Filled 2023-12-13: qty 2

## 2023-12-13 MED ORDER — PIVOT 1.5 CAL PO LIQD
1000.0000 mL | ORAL | Status: DC
  Administered 2023-12-13: 1000 mL
  Filled 2023-12-13 (×2): qty 1000

## 2023-12-13 MED ORDER — CHLORHEXIDINE GLUCONATE CLOTH 2 % EX PADS
6.0000 | MEDICATED_PAD | Freq: Every day | CUTANEOUS | Status: DC
  Administered 2023-12-14 – 2023-12-24 (×10): 6 via TOPICAL

## 2023-12-13 MED ORDER — PROSOURCE TF20 ENFIT COMPATIBL EN LIQD
60.0000 mL | Freq: Every day | ENTERAL | Status: DC
  Administered 2023-12-13 – 2023-12-14 (×2): 60 mL
  Filled 2023-12-13 (×2): qty 60

## 2023-12-13 MED ORDER — ORAL CARE MOUTH RINSE
15.0000 mL | OROMUCOSAL | Status: DC | PRN
Start: 2023-12-13 — End: 2023-12-21

## 2023-12-13 MED ORDER — LACTATED RINGERS IV BOLUS
1000.0000 mL | Freq: Once | INTRAVENOUS | Status: AC
  Administered 2023-12-13: 1000 mL via INTRAVENOUS

## 2023-12-13 MED ORDER — ORAL CARE MOUTH RINSE
15.0000 mL | OROMUCOSAL | Status: DC
  Administered 2023-12-13 – 2023-12-25 (×147): 15 mL via OROMUCOSAL

## 2023-12-13 MED ORDER — POLYETHYLENE GLYCOL 3350 17 G PO PACK
17.0000 g | PACK | Freq: Every day | ORAL | Status: DC
  Administered 2023-12-13 – 2023-12-23 (×8): 17 g
  Filled 2023-12-13 (×9): qty 1

## 2023-12-13 NOTE — Progress Notes (Signed)
 Brief Nutrition Note  Consult received for enteral/tube feeding initiation and management.  Adult Enteral Nutrition Protocol initiated. Full assessment to follow.  OG vented/dual lumen Fr. 14 tube in place with tip located in upper stomach per xray imaging.  Current EN: PEPuP Titration: Start tube feeding @ 30 ml/hr. Maintain at goal rate. If TF held calculate new goal rate once ready to resume TF. Each day at 0000 reset TF to goal rate. Max rate of 150 ml/hr.   Admitting Dx: Cardiac arrest (HCC) [I46.9] Dyspnea, unspecified type [R06.00]  Body mass index is 25.78 kg/m. P  Labs:  Recent Labs  Lab 12/12/23 1543 12/13/23 0317 12/13/23 0946  NA 138 138 137  K 6.0* 5.3* 4.4  CL 112* 109 106  CO2 7* 15* 20*  BUN 19 27* 31*  CREATININE 1.37* 1.66* 1.80*  CALCIUM  8.9 8.1* 7.8*  MG  --  2.4 2.3  PHOS  --  7.4* 5.9*  GLUCOSE 238* 178* 135*    [name] Pew RDN, LDN Clinical Dietitian   If unable to reach, please contact RD Inpatient secure chat group between 8 am-4 pm daily

## 2023-12-13 NOTE — Progress Notes (Signed)
  Echocardiogram 2D Echocardiogram has been performed.  Ocie Doyne RDCS 12/13/2023, 3:18 PM

## 2023-12-13 NOTE — Progress Notes (Signed)
 Ground CCM MD at bedside, and made aware pt received from ED with EPI and Levophed  gtt infusing without a current order, as pt was Post CODE in ED. MD gave verbal order for this RN to titrate EPI gtt off. See verbal care order. Current orders placed for Levo gtt, per MD.

## 2023-12-13 NOTE — Progress Notes (Signed)
 NAME:  Jasmin Miller, MRN:  [id number], DOB:  [birth year], LOS: 1 ADMISSION DATE:  12/12/2023, CONSULTATION DATE:  12/12/23 REFERRING MD:  Lamar Salen, MD CHIEF COMPLAINT:  Hypotension, acute blood loss anemia   History of Present Illness:  83 year old female on with pmhx below who presented for shortness of breath x 1 day.  Labs significant for Hg 4, Plt 246, iSTAT LA 13, K 6.0, CO2 7 BUN/Cr 19/1.37. Trop 133. BNP 2599.  VBG 7.06/30/50/7.5.  CXR no infiltrate effusion or edema. Influenza, RSV and covid negative.  In the ED patient became hypotensive and loss of mental status. Levophed  gtt started. Patient intubated in the ED due to airway protection due to obtunded status. 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  Progressed to PEA Cardiac arrest required CPR x 10 min (estimated) and achieved ROSC in the ED. Placed on epi gtt. Additional ER interventions: PRBC x fusion (4 Units ordered), in addition to crystalloid resuscitation. Placed on Norepi, phenylephrine  and epi gtt. Cultures sent. EDP spoke to family. No further CPR but cont supportive care. Remained hypotensive and in low perfusion state post resuscitation PCCM asked to admit   Family at bedside. Patient has shown decline over past 2 months with poor oral intake, only eating soup. She has remained active in cooking and cleaning at home on her own. Has become very weak over the past 2 months.   Pertinent  Medical History  DM2, HTN, , CHF, IDA, atrial fibrillation on Eliquis   Significant Hospital Events: Including procedures, antibiotic start and stop dates in addition to other pertinent events   1/4 Presented w/ CC SOB, had lactate 13, hgb 4, BNP 2599. D-dimer negative While in ER had loss of LOC, became hypoxic, intubated. Had cardiac arrest. ACLS provided. Time to ROSC estimated ~10  minutes. Remained in low perfusion state in spite of blood, fluid resuscitation and pressors. Made DNR in ER should arrest occur again. PCCM asked to admit.   Interim History / Subjective:  Admitted overnight Weaned off epi. Remains on low dose levo Responded to PRBC x 4 with Hg 10  Objective   Blood pressure (!) 139/56, pulse 95, temperature 99 F (37.2 C), resp. rate (!) 23, height 5' 1 (1.549 m), weight 61.9 kg, SpO2 100%.    Vent Mode: PRVC FiO2 (%):  [40 %-100 %] 40 % Set Rate:  [18 bmp] 18 bmp Vt Set:  [380 mL] 380 mL PEEP:  [5 cmH20] 5 cmH20 Plateau Pressure:  [13 cmH20-14 cmH20] 13 cmH20   Intake/Output Summary (Last 24 hours) at 12/13/2023 0735 Last data filed at 12/13/2023 0640 Gross per 24 hour  Intake 3493.38 ml  Output 350 ml  Net 3143.38 ml   Filed Weights   12/13/23 0600  Weight: 61.9 kg    Physical Exam: General: Critically and chronically ill-appearing, no acute distress HENT: [place], AT, ETT in place Eyes: EOMI, no scleral icterus Respiratory: Clear to auscultation bilaterally.  No crackles, wheezing or rales Cardiovascular: RRR, -M/R/G, no JVD GI: BS+, soft, nontender Extremities:-Edema,-tenderness Neuro: Eyes open, follows simple commands intermittently, moves extremities x 4, CNII-XII grossly intact GU: Foley in place  Imaging, labs and test in EMR in the last 24 hours reviewed independently by me. Pertinent findings below: ABG 7.26/41/142 K 5.3 CO2 15 BUN/Cr 27/1.66 Troponin 2259 LA 13>3.8 Hg 4>10.1 WBC 29.5 Resolved Hospital Problem list  N/A  Assessment & Plan:   S/p in-hospital cardiac arrest 2/2 severe anemia and metabolic acidosis Plan Tele  Trops Echo Avoid fevers Attempt to keep MAP > 65 S/p transfusion as noted below Keep euglycemic  Trend LA, ABG, CBC, CMET  Hemorrhagic shock/Acute blood loss anemia Lactic acidosis Plan Weaned off epi. Wean levophed  for MAP >65 S/p PRBC x 4U. Currently FFP x1  Hold eliquis  and asa Serial  chems F/u cultures   AKI with metabolic acidosis - improving Plan Continue bicarb drip, 1L over 10hrs Monitor UOP/Cr Avoid nephrotoxic agents Maintain perfusion as noted above  Acute metabolic encephalopathy  Became progressively altered while in ER. Likely 2/2 to profound anemia and acidosis. Further c/b cardiac arrest and low flow state raising concern for anoxic injury.  Plan Supportive care Avoid fevers PAD protocol for RASS goal -1  Abnormal CT head. 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 This is nonspecific, but could represent a meningioma. Plan F/u out pt if survives   DM2 Plan CBG q4h with SSI  Atrial fibrillation/hx NSVT Chronic systolic heart failure (EF 40-45%) HTN Tele Hold antihypertensive agents: Lopressor , entresto  Hold diuretics: spironolactone  Hold eliquis  in setting of bleed  Best Practice (right click and Reselect all SmartList Selections daily)   Diet/type: tubefeeds DVT prophylaxis other Pressure ulcer(s): pressure ulcer assessment deferred  GI prophylaxis: H2B Lines: N/A Foley:  Yes, and it is still needed Code Status:  full code Last date of multidisciplinary goals of care discussion [1/4 discussed with family at bedside, agree with DNR but want to continue aggressive care at this time]  Critical care time: 55 minutes    The patient is critically ill with multiple organ systems failure and requires high complexity decision making for assessment and support, frequent evaluation and titration of therapies, application of advanced monitoring technologies and extensive interpretation of multiple databases.  Independent Critical Care Time: 55 Minutes.   Slater Staff, M.D. [facility] Pulmonary/Critical Care Medicine 12/13/2023 7:35 AM   Please  see Amion for pager number to reach on-call Pulmonary and Critical Care Team.

## 2023-12-14 ENCOUNTER — Inpatient Hospital Stay (HOSPITAL_COMMUNITY): Payer: 59

## 2023-12-14 DIAGNOSIS — I469 Cardiac arrest, cause unspecified: Secondary | ICD-10-CM | POA: Diagnosis not present

## 2023-12-14 LAB — COMPREHENSIVE METABOLIC PANEL
ALT: 262 U/L — ABNORMAL HIGH (ref 0–44)
AST: 224 U/L — ABNORMAL HIGH (ref 15–41)
Albumin: 3 g/dL — ABNORMAL LOW (ref 3.5–5.0)
Alkaline Phosphatase: 105 U/L (ref 38–126)
Anion gap: 8 (ref 5–15)
BUN: 43 mg/dL — ABNORMAL HIGH (ref 8–23)
CO2: 23 mmol/L (ref 22–32)
Calcium: 7.7 mg/dL — ABNORMAL LOW (ref 8.9–10.3)
Chloride: 108 mmol/L (ref 98–111)
Creatinine, Ser: 1.91 mg/dL — ABNORMAL HIGH (ref 0.44–1.00)
GFR, Estimated: 26 mL/min — ABNORMAL LOW (ref >60)
Glucose, Bld: 151 mg/dL — ABNORMAL HIGH (ref 70–99)
Potassium: 3.9 mmol/L (ref 3.5–5.1)
Sodium: 139 mmol/L (ref 135–145)
Total Bilirubin: 0.5 mg/dL (ref 0.0–1.2)
Total Protein: 5.9 g/dL — ABNORMAL LOW (ref 6.5–8.1)

## 2023-12-14 LAB — PHOSPHORUS
Phosphorus: 3.9 mg/dL (ref 2.5–4.6)
Phosphorus: 2.6 mg/dL (ref 2.5–4.6)

## 2023-12-14 LAB — PREPARE FRESH FROZEN PLASMA: Unit division: 0

## 2023-12-14 LAB — HEMOGLOBIN A1C
Hgb A1c MFr Bld: 5.5 % (ref 4.8–5.6)
Mean Plasma Glucose: 111 mg/dL

## 2023-12-14 LAB — COOXEMETRY PANEL
Carboxyhemoglobin: 1.9 % — ABNORMAL HIGH (ref 0.5–1.5)
Methemoglobin: 0.8 % (ref 0.0–1.5)
O2 Saturation: 77.3 %
Total hemoglobin: 8.2 g/dL — ABNORMAL LOW (ref 12.0–16.0)

## 2023-12-14 LAB — GLUCOSE, CAPILLARY
Glucose-Capillary: 169 mg/dL — ABNORMAL HIGH (ref 70–99)
Glucose-Capillary: 132 mg/dL — ABNORMAL HIGH (ref 70–99)
Glucose-Capillary: 130 mg/dL — ABNORMAL HIGH (ref 70–99)
Glucose-Capillary: 131 mg/dL — ABNORMAL HIGH (ref 70–99)
Glucose-Capillary: 120 mg/dL — ABNORMAL HIGH (ref 70–99)
Glucose-Capillary: 151 mg/dL — ABNORMAL HIGH (ref 70–99)

## 2023-12-14 LAB — BPAM FFP
Blood Product Expiration Date: 202501102359
Blood Product Expiration Date: 202501102359
ISSUE DATE / TIME: 202501050539
ISSUE DATE / TIME: 202501050906
Unit Type and Rh: 7300
Unit Type and Rh: 7300

## 2023-12-14 LAB — CBC
HCT: 26.7 % — ABNORMAL LOW (ref 36.0–46.0)
HCT: 25.4 % — ABNORMAL LOW (ref 36.0–46.0)
Hemoglobin: 8.3 g/dL — ABNORMAL LOW (ref 12.0–15.0)
Hemoglobin: 7.9 g/dL — ABNORMAL LOW (ref 12.0–15.0)
MCH: 25.8 pg — ABNORMAL LOW (ref 26.0–34.0)
MCH: 26 pg (ref 26.0–34.0)
MCHC: 31.1 g/dL (ref 30.0–36.0)
MCHC: 31.1 g/dL (ref 30.0–36.0)
MCV: 82.9 fL (ref 80.0–100.0)
MCV: 83.6 fL (ref 80.0–100.0)
Platelets: 120 10*3/uL — ABNORMAL LOW (ref 150–400)
Platelets: 100 10*3/uL — ABNORMAL LOW (ref 150–400)
RBC: 3.22 MIL/uL — ABNORMAL LOW (ref 3.87–5.11)
RBC: 3.04 MIL/uL — ABNORMAL LOW (ref 3.87–5.11)
RDW: 22.1 % — ABNORMAL HIGH (ref 11.5–15.5)
RDW: 22.1 % — ABNORMAL HIGH (ref 11.5–15.5)
WBC: 17.5 10*3/uL — ABNORMAL HIGH (ref 4.0–10.5)
WBC: 16.6 10*3/uL — ABNORMAL HIGH (ref 4.0–10.5)
nRBC: 1.4 % — ABNORMAL HIGH (ref 0.0–0.2)
nRBC: 0.5 % — ABNORMAL HIGH (ref 0.0–0.2)

## 2023-12-14 LAB — CORTISOL: Cortisol, Plasma: 19.2 ug/dL

## 2023-12-14 LAB — MAGNESIUM
Magnesium: 2.5 mg/dL — ABNORMAL HIGH (ref 1.7–2.4)
Magnesium: 2.6 mg/dL — ABNORMAL HIGH (ref 1.7–2.4)

## 2023-12-14 LAB — PROCALCITONIN: Procalcitonin: 16.47 ng/mL

## 2023-12-14 MED ORDER — POLYETHYLENE GLYCOL 3350 17 G PO PACK: 17.0000 g | PACK | Freq: Every day | ORAL | Status: DC

## 2023-12-14 MED ORDER — FENTANYL CITRATE PF 50 MCG/ML IJ SOSY
25.0000 ug | PREFILLED_SYRINGE | INTRAMUSCULAR | Status: DC | PRN
  Administered 2023-12-15 (×2): 25 ug via INTRAVENOUS
  Filled 2023-12-14 (×3): qty 1

## 2023-12-14 MED ORDER — NOREPINEPHRINE 16 MG/250ML-% IV SOLN
0.0000 ug/min | INTRAVENOUS | Status: DC
  Administered 2023-12-14: 3 ug/min via INTRAVENOUS

## 2023-12-14 MED ORDER — FENTANYL CITRATE PF 50 MCG/ML IJ SOSY
25.0000 ug | PREFILLED_SYRINGE | INTRAMUSCULAR | Status: DC | PRN
  Administered 2023-12-15: 25 ug via INTRAVENOUS

## 2023-12-14 MED ORDER — DEXMEDETOMIDINE HCL IN NACL 400 MCG/100ML IV SOLN
0.0000 ug/kg/h | INTRAVENOUS | Status: DC
  Administered 2023-12-14 (×2): 0.4 ug/kg/h via INTRAVENOUS
  Administered 2023-12-15: 0.5 ug/kg/h via INTRAVENOUS
  Administered 2023-12-16: 0.3 ug/kg/h via INTRAVENOUS
  Administered 2023-12-17: 0.8 ug/kg/h via INTRAVENOUS
  Administered 2023-12-17: 0.7 ug/kg/h via INTRAVENOUS
  Administered 2023-12-17 – 2023-12-18 (×2): 0.8 ug/kg/h via INTRAVENOUS
  Administered 2023-12-18 (×2): 1 ug/kg/h via INTRAVENOUS
  Administered 2023-12-19: 0.8 ug/kg/h via INTRAVENOUS
  Administered 2023-12-19: 1 ug/kg/h via INTRAVENOUS
  Administered 2023-12-19 – 2023-12-20 (×2): 0.4 ug/kg/h via INTRAVENOUS
  Administered 2023-12-21: 0.3 ug/kg/h via INTRAVENOUS
  Administered 2023-12-21: 0.5 ug/kg/h via INTRAVENOUS
  Administered 2023-12-22: 0.8 ug/kg/h via INTRAVENOUS
  Administered 2023-12-22: 0.6 ug/kg/h via INTRAVENOUS
  Administered 2023-12-23: 0.8 ug/kg/h via INTRAVENOUS
  Administered 2023-12-23: 0.6 ug/kg/h via INTRAVENOUS
  Administered 2023-12-23 – 2023-12-24 (×2): 0.8 ug/kg/h via INTRAVENOUS
  Administered 2023-12-24: 0.3 ug/kg/h via INTRAVENOUS
  Filled 2023-12-14 (×24): qty 100

## 2023-12-14 MED ORDER — VITAL AF 1.2 CAL PO LIQD
1000.0000 mL | ORAL | Status: DC
  Administered 2023-12-14 – 2023-12-23 (×8): 1000 mL

## 2023-12-14 MED ORDER — FAMOTIDINE 20 MG PO TABS
20.0000 mg | ORAL_TABLET | Freq: Every day | ORAL | Status: DC
  Administered 2023-12-15 – 2023-12-24 (×10): 20 mg
  Filled 2023-12-14 (×10): qty 1

## 2023-12-14 MED ORDER — NOREPINEPHRINE 16 MG/250ML-% IV SOLN
2.0000 ug/min | INTRAVENOUS | Status: DC
  Administered 2023-12-14: 2 ug/min via INTRAVENOUS
  Filled 2023-12-14: qty 250

## 2023-12-14 MED ORDER — SODIUM CHLORIDE 0.9 % IV SOLN
3.0000 g | Freq: Two times a day (BID) | INTRAVENOUS | Status: DC
  Administered 2023-12-14: 3 g via INTRAVENOUS
  Filled 2023-12-14: qty 8

## 2023-12-14 MED ORDER — SODIUM CHLORIDE 0.9 % IV SOLN
3.0000 g | Freq: Once | INTRAVENOUS | Status: AC
  Administered 2023-12-14: 3 g via INTRAVENOUS
  Filled 2023-12-14: qty 8

## 2023-12-14 MED ORDER — DOCUSATE SODIUM 50 MG/5ML PO LIQD: 100.0000 mg | Freq: Two times a day (BID) | ORAL | Status: DC

## 2023-12-14 NOTE — Procedures (Signed)
 Central Venous Catheter Insertion Procedure Note  Jasmin Miller  [id number]  07-Jun-1941  Date:12/14/23  Time:12:28 PM   Provider Performing:Pete FORBES [name]   Procedure: Insertion of Non-tunneled Central Venous Catheter(36556) with US  guidance (23062)   Indication(s) Medication administration and Difficult access  Consent Risks of the procedure as well as the alternatives and risks of each were explained to the patient and/or caregiver.  Consent for the procedure was obtained and is signed in the bedside chart  Anesthesia Topical only with 1% lidocaine    Timeout Verified patient identification, verified procedure, site/side was marked, verified correct patient position, special equipment/implants available, medications/allergies/relevant history reviewed, required imaging and test results available.  Sterile Technique Maximal sterile technique including full sterile barrier drape, hand hygiene, sterile gown, sterile gloves, mask, hair covering, sterile ultrasound probe cover (if used).  Procedure Description Area of catheter insertion was cleaned with chlorhexidine  and draped in sterile fashion.  With real-time ultrasound guidance a central venous catheter was placed into the left internal jugular vein. Nonpulsatile blood flow and easy flushing noted in all ports.  The catheter was sutured in place and sterile dressing applied.  Complications/Tolerance None; patient tolerated the procedure well. Chest X-ray is ordered to verify placement for internal jugular or subclavian cannulation.   Chest x-ray is not ordered for femoral cannulation.  EBL Minimal  Specimen(s) None  Maude FORBES [name] ACNP-BC [facility] Pulmonary/Critical Care Pager # [phone] OR # [phone] if no answer

## 2023-12-14 NOTE — Progress Notes (Signed)
 Pharmacy Antibiotic Note  Jasmin Miller is a 82 y.o. female admitted on 12/12/2023 with pneumonia.  Pharmacy has been consulted for Unasyn  dosing for aspiration PNA.  Plan: Unasyn  3 gm IV q12h F/u renal function & culture data  Height: 5' 1 (154.9 cm) Weight: 61.9 kg (136 lb 7.4 oz) IBW/kg (Calculated) : 47.8  Temp (24hrs), Avg:99.8 F (37.7 C), Min:98.6 F (37 C), Max:100.6 F (38.1 C)  Recent Labs  Lab 12/12/23 1543 12/12/23 1710 12/13/23 0317 12/13/23 0946 12/13/23 1512 12/13/23 1712 12/14/23 0457  WBC 6.0  --  29.5* 20.1* 18.7* 16.7* 17.5*  CREATININE 1.37*  --  1.66* 1.80*  --   --  1.91*  LATICACIDVEN  --  13.2* 3.8* 2.5*  --   --   --     Estimated Creatinine Clearance: 19.1 mL/min (A) (by C-G formula based on SCr of 1.91 mg/dL (H)).    No Known Allergies  Antimicrobials this admission: 1/6 Unasyn >> Dose adjustments this admission:  Microbiology results: 1/5 MRSA neg 1/4 BCx2: ngtd  Thank you for allowing pharmacy to be a part of this patient's care.  [name] IVAR Edison, Pharm.D Use secure chat for questions 12/14/2023 2:28 PM

## 2023-12-14 NOTE — Progress Notes (Signed)
 CVL placed earlier today -->9 Co-ox >70 Weaning his norepi.  Now on precedex  gtt as seemed as though the fent resulted in oversedation Her PCT was > 16. This does raise concern for infection but really not sure where her source is. CXR does not have clear infiltrate but certainly would have been at risk for aspiration  Plan Cont care as outlined Wean dex for RASS 0 to -1 Cont tubefeeds and supportive care Adding Unasyn  empirically and sending sputum   Jasmin Miller Banner ACNP-BC [facility] Pulmonary/Critical Care Pager # [phone] OR # [phone] if no answer

## 2023-12-14 NOTE — Progress Notes (Addendum)
 Initial Nutrition Assessment  DOCUMENTATION CODES:   Non-severe (moderate) malnutrition in context of chronic illness  INTERVENTION:  - Per CCM, can advance tube feeds today. New tube feeding via OGT as below: Vital 1.2 at 55 ml/h (1320 ml per day) *Start at 54mL/hr and advance by 10mL Q8H Provides 1584 kcal, 99 gm protein, 1070 ml free water  daily  - Monitor magnesium , potassium, and phosphorus BID for at least 3 days, MD to replete as needed, as pt may be at risk for refeeding syndrome.  - FWF per CCM/MD.   - Monitor weight trends.    NUTRITION DIAGNOSIS:   Moderate Malnutrition related to chronic illness as evidenced by mild fat depletion, mild muscle depletion.  GOAL:   Patient will meet greater than or equal to 90% of their needs  MONITOR:   Vent status, Labs, Weight trends, TF tolerance  REASON FOR ASSESSMENT:   Consult (Trickle feeds) Enteral/tube feeding initiation and management  ASSESSMENT:   83 year old female with PMH DM2 and HTN who presented for shortness of breath and admitted for cardiac arrest.  1/4 Admit; Intubated 1/5: Pivot started at 7mL/hr  Patient is currently intubated on ventilator support MV: 5.1 L/min Temp (24hrs), Avg:99.8 F (37.7 C), Min:98.6 F (37 C), Max:100.6 F (38.1 C)  Patient's daughter at bedside. She reports patient's UBW as 130# and that patient has been losing weight from a few months due to eating less.  Per EMR, patient weighed at 128# in July and August and weighed this admission at 136#. Will need to monitor weight trends.   She reports patient was mostly eating soups PTA and her appetite had been decreased.   Patient started on TF protocol of Pivot at 9mL/hr yesterday. Per RN, patient tolerating well.   Per discussion with CCM NP, can advance tube feeds today to goal. Plan to wean sedation and pressors for SBT.    Medications reviewed and include: Colace, Miralax  Precedex  Levophed  @ 33mcg/min  Labs  reviewed:  Creatinine 1.91 HA1C 6.5 Blood Glucose 119-169 x24 hours   NUTRITION - FOCUSED PHYSICAL EXAM:  Flowsheet Row Most Recent Value  Orbital Region Moderate depletion  Upper Arm Region Mild depletion  Thoracic and Lumbar Region Mild depletion  Buccal Region Unable to assess  Temple Region Moderate depletion  Clavicle Bone Region Mild depletion  Clavicle and Acromion Bone Region Mild depletion  Scapular Bone Region Unable to assess  Dorsal Hand No depletion  Patellar Region Mild depletion  Anterior Thigh Region Mild depletion  Posterior Calf Region No depletion  Edema (RD Assessment) None  Hair Reviewed  Eyes Reviewed  Mouth Reviewed  Skin Reviewed  Nails Reviewed       Diet Order:   Diet Order             Diet NPO time specified  Diet effective now                   EDUCATION NEEDS:  Education needs have been addressed  Skin:  Skin Assessment: Reviewed RN Assessment  Last BM:  unknown  Height:  Ht Readings from Last 1 Encounters:  12/12/23 5' 1 (1.549 m)   Weight:  Wt Readings from Last 1 Encounters:  12/13/23 61.9 kg    BMI:  Body mass index is 25.78 kg/m.  Estimated Nutritional Needs:  Kcal:  1550-1750 kcals Protein:  85-100 grams Fluid:  >/= 1.5L    Trude Ned RD, LDN Contact via Secure Chat.

## 2023-12-14 NOTE — Progress Notes (Signed)
 NAME:  Jasmin Miller, MRN:  [id number], DOB:  01-19-1941, LOS: 2 ADMISSION DATE:  12/12/2023, CONSULTATION DATE:  12/12/23 REFERRING MD:  Lamar Salen, MD CHIEF COMPLAINT:  Hypotension, acute blood loss anemia   History of Present Illness:  83 year old female on with pmhx below who presented for shortness of breath x 1 day.  Labs significant for Hg 4, Plt 246, iSTAT LA 13, K 6.0, CO2 7 BUN/Cr 19/1.37. Trop 133. BNP 2599.  VBG 7.06/30/50/7.5.  CXR no infiltrate effusion or edema. Influenza, RSV and covid negative.  In the ED patient became hypotensive and loss of mental status. Levophed  gtt started. Patient intubated in the ED due to airway protection due to obtunded status. 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  Progressed to PEA Cardiac arrest required CPR x 10 min (estimated) and achieved ROSC in the ED. Placed on epi gtt. Additional ER interventions: PRBC x fusion (4 Units ordered), in addition to crystalloid resuscitation. Placed on Norepi, phenylephrine  and epi gtt. Cultures sent. EDP spoke to family. No further CPR but cont supportive care. Remained hypotensive and in low perfusion state post resuscitation PCCM asked to admit   Family at bedside. Patient has shown decline over past 2 months with poor oral intake, only eating soup. She has remained active in cooking and cleaning at home on her own. Has become very weak over the past 2 months.   Pertinent  Medical History  DM2, HTN, , CHF, IDA, atrial fibrillation on Eliquis   Significant Hospital Events: Including procedures, antibiotic start and stop dates in addition to other pertinent events   1/4 Presented w/ CC SOB, had lactate 13, hgb 4, BNP 2599. D-dimer negative While in ER had loss of LOC, became hypoxic, intubated. Had cardiac arrest. ACLS provided. Time to ROSC estimated ~10  minutes. Remained in low perfusion state in spite of blood, fluid resuscitation and pressors. Made DNR in ER should arrest occur again. PCCM asked to admit.  1/5 epi off. Completed total 4 PRBC and 2 FFP. More awake.  1/6 acidosis resolved. LFTs improving. H&H stable. PLTs stable/improving. Renal fxn a little worse. Still on pressors. Still on vent. Started on fent gtt on 5th. Less responsive  Interim History / Subjective:  Laying in bed. Not responding. Still on pressors  Objective   Blood pressure (!) 121/59, pulse 90, temperature 99.3 F (37.4 C), resp. rate 12, height 5' 1 (1.549 m), weight 61.9 kg, SpO2 99%.    Vent Mode: PRVC FiO2 (%):  [30 %-40 %] 30 % Set Rate:  [18 bmp] 18 bmp Vt Set:  [380 mL-390 mL] 390 mL PEEP:  [5 cmH20] 5 cmH20 Plateau Pressure:  [8 cmH20-20 cmH20] 20 cmH20   Intake/Output Summary (Last 24 hours) at 12/14/2023 0725 Last data filed at 12/14/2023 9391 Gross per 24 hour  Intake 2249.5 ml  Output 575 ml  Net 1674.5 ml   Filed Weights   12/13/23 0600  Weight: 61.9 kg    Physical Exam: General this is a 83 year old female who is laying in bed. She is in no distress but is not interactive.  HENT orally intubated. No JVD MMM PERRL Pulm some scattered rhonchi. No accessory use. Still on full vent support.  Card RRR Ext cool pulses weak trace LE  Abd soft  Neuro eyes open. Not following commands. + cough w/ sxn. Flicker of  response to pain (was following commands 1/5)  Resolved Hospital Problem list   S/p in-hospital cardiac arrest 2/2 severe anemia and metabolic acidosis Hemorrhagic shock Lactic acidosis  Metabolic acidosis  Assessment & Plan:    Circulatory shock post arrest.  Initially hemorrhagic and acidosis mediated. Now etiology of on-going pressor requirements not clear. Still NE dep. Does have fever and leukocytosis, but she is cold to palp so also wonder about cardiogenic component  Plan Cont to wean NE for MAP > 65 Ck PCT Cultures on  1/4 neg. If PCT elevated repeat Repeat CXR today (I don't see infiltrate on yesterdays) Ck cortisol Will get CVL, ck SCVO2, CVP repeat lactate   Ventilator management  Remains on vent s/p arrest 2/2 encephalopathy  Plan Cont full vent support Vap bundle PAD protocol RASS goal 0 to -1 Repeating CXR later today   Acute blood loss anemia; FOB +  S/p PRBC x 4U &  FFP x2; Hgb stable since 1/5 Plan Hold eliquis  and asa Will need GI eval if improves to point can be transferred out of ICU   Acute on chronic renal failure CKD Stage 3b - GFR 30 to 44 (Moderately to severely decreased)  Scr bumped a little since yesterday  Plan Ensure she is euvolemic Renal dose meds Strict I&O Am chem   Acute metabolic encephalopathy  Became progressively altered while in ER. Likely 2/2 to profound anemia and acidosis. Further c/b cardiac arrest and low flow state raising concern for anoxic injury.  Plan Treat fevers RASS goal 0 to -1 Keep euglycemic Supportive care   Atrial fibrillation/hx NSVT Chronic systolic heart failure (EF 40-45%) HTN Tele Hold antihypertensive agents: Lopressor , entresto  and diuretics: spironolactone  given on-going hypotension Hold eliquis  in setting of bleed; prob no longer systemic AC candidate   Thrombocytopenia -stable/improved Plan Trend   Shock liver s/p arrest Plan Repeat LFTs one more time in am then change to PRN  DM2 Plan CBG q4h with SSI Goal 140-180   Abnormal CT head. 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 This is nonspecific, but could represent a meningioma. Plan F/u out pt if survives    Best Practice (right click and Reselect all SmartList Selections daily)   Diet/type: tubefeeds DVT prophylaxis other Pressure ulcer(s): pressure  ulcer assessment deferred  GI prophylaxis: H2B Lines: N/A Foley:  Yes, and it is still needed Code Status:  full code Last date of multidisciplinary goals of care discussion [1/4 discussed with family at bedside, agree with DNR but want to continue aggressive care at this time]  Critical care time: 43 min      Maude FORBES Banner ACNP-BC [facility] Pulmonary/Critical Care Pager # [phone] OR # [phone] if no answer

## 2023-12-15 DIAGNOSIS — E440 Moderate protein-calorie malnutrition: Secondary | ICD-10-CM | POA: Insufficient documentation

## 2023-12-15 DIAGNOSIS — I469 Cardiac arrest, cause unspecified: Secondary | ICD-10-CM | POA: Diagnosis not present

## 2023-12-15 LAB — COMPREHENSIVE METABOLIC PANEL
ALT: 160 U/L — ABNORMAL HIGH (ref 0–44)
AST: 67 U/L — ABNORMAL HIGH (ref 15–41)
Albumin: 2.4 g/dL — ABNORMAL LOW (ref 3.5–5.0)
Alkaline Phosphatase: 99 U/L (ref 38–126)
Anion gap: 5 (ref 5–15)
BUN: 40 mg/dL — ABNORMAL HIGH (ref 8–23)
CO2: 24 mmol/L (ref 22–32)
Calcium: 7.5 mg/dL — ABNORMAL LOW (ref 8.9–10.3)
Chloride: 110 mmol/L (ref 98–111)
Creatinine, Ser: 1.21 mg/dL — ABNORMAL HIGH (ref 0.44–1.00)
GFR, Estimated: 45 mL/min — ABNORMAL LOW (ref >60)
Glucose, Bld: 160 mg/dL — ABNORMAL HIGH (ref 70–99)
Potassium: 3.5 mmol/L (ref 3.5–5.1)
Sodium: 139 mmol/L (ref 135–145)
Total Bilirubin: 0.7 mg/dL (ref 0.0–1.2)
Total Protein: 5.3 g/dL — ABNORMAL LOW (ref 6.5–8.1)

## 2023-12-15 LAB — CBC
HCT: 26.6 % — ABNORMAL LOW (ref 36.0–46.0)
HCT: 25.2 % — ABNORMAL LOW (ref 36.0–46.0)
Hemoglobin: 8.3 g/dL — ABNORMAL LOW (ref 12.0–15.0)
Hemoglobin: 7.9 g/dL — ABNORMAL LOW (ref 12.0–15.0)
MCH: 26.2 pg (ref 26.0–34.0)
MCH: 26.4 pg (ref 26.0–34.0)
MCHC: 31.2 g/dL (ref 30.0–36.0)
MCHC: 31.3 g/dL (ref 30.0–36.0)
MCV: 83.9 fL (ref 80.0–100.0)
MCV: 84.3 fL (ref 80.0–100.0)
Platelets: 98 10*3/uL — ABNORMAL LOW (ref 150–400)
Platelets: 92 10*3/uL — ABNORMAL LOW (ref 150–400)
RBC: 3.17 MIL/uL — ABNORMAL LOW (ref 3.87–5.11)
RBC: 2.99 MIL/uL — ABNORMAL LOW (ref 3.87–5.11)
RDW: 22.3 % — ABNORMAL HIGH (ref 11.5–15.5)
RDW: 22.7 % — ABNORMAL HIGH (ref 11.5–15.5)
WBC: 15.6 10*3/uL — ABNORMAL HIGH (ref 4.0–10.5)
WBC: 13.6 10*3/uL — ABNORMAL HIGH (ref 4.0–10.5)
nRBC: 0.6 % — ABNORMAL HIGH (ref 0.0–0.2)
nRBC: 0.8 % — ABNORMAL HIGH (ref 0.0–0.2)

## 2023-12-15 LAB — GLUCOSE, CAPILLARY
Glucose-Capillary: 112 mg/dL — ABNORMAL HIGH (ref 70–99)
Glucose-Capillary: 132 mg/dL — ABNORMAL HIGH (ref 70–99)
Glucose-Capillary: 131 mg/dL — ABNORMAL HIGH (ref 70–99)
Glucose-Capillary: 134 mg/dL — ABNORMAL HIGH (ref 70–99)
Glucose-Capillary: 148 mg/dL — ABNORMAL HIGH (ref 70–99)
Glucose-Capillary: 138 mg/dL — ABNORMAL HIGH (ref 70–99)

## 2023-12-15 LAB — PHOSPHORUS
Phosphorus: 2.4 mg/dL — ABNORMAL LOW (ref 2.5–4.6)
Phosphorus: 2.6 mg/dL (ref 2.5–4.6)

## 2023-12-15 LAB — MAGNESIUM
Magnesium: 2.4 mg/dL (ref 1.7–2.4)
Magnesium: 2.4 mg/dL (ref 1.7–2.4)

## 2023-12-15 MED ORDER — METOPROLOL TARTRATE 5 MG/5ML IV SOLN
5.0000 mg | Freq: Three times a day (TID) | INTRAVENOUS | Status: DC
  Administered 2023-12-15: 5 mg via INTRAVENOUS
  Filled 2023-12-15: qty 5

## 2023-12-15 MED ORDER — DILTIAZEM HCL 25 MG/5ML IV SOLN: 10.0000 mg | Freq: Once | INTRAVENOUS | Status: AC

## 2023-12-15 MED ORDER — DILTIAZEM HCL 25 MG/5ML IV SOLN
INTRAVENOUS | Status: AC
  Administered 2023-12-15: 10 mg via INTRAVENOUS
  Filled 2023-12-15: qty 5

## 2023-12-15 MED ORDER — DILTIAZEM 12 MG/ML ORAL SUSPENSION
30.0000 mg | Freq: Four times a day (QID) | ORAL | Status: DC
  Administered 2023-12-15 – 2023-12-16 (×2): 30 mg
  Filled 2023-12-15 (×4): qty 2.5

## 2023-12-15 MED ORDER — FENTANYL CITRATE PF 50 MCG/ML IJ SOSY
25.0000 ug | PREFILLED_SYRINGE | Freq: Once | INTRAMUSCULAR | Status: AC
  Administered 2023-12-15: 25 ug via INTRAVENOUS

## 2023-12-15 MED ORDER — FENTANYL CITRATE PF 50 MCG/ML IJ SOSY
12.5000 ug | PREFILLED_SYRINGE | INTRAMUSCULAR | Status: DC | PRN
  Administered 2023-12-15 – 2023-12-16 (×4): 12.5 ug via INTRAVENOUS
  Filled 2023-12-15 (×5): qty 1

## 2023-12-15 MED ORDER — SODIUM CHLORIDE 0.9 % IV SOLN
3.0000 g | Freq: Three times a day (TID) | INTRAVENOUS | Status: DC
  Administered 2023-12-15 – 2023-12-16 (×6): 3 g via INTRAVENOUS
  Filled 2023-12-15 (×6): qty 8

## 2023-12-15 MED ORDER — DILTIAZEM HCL-DEXTROSE 125-5 MG/125ML-% IV SOLN (PREMIX): 5.0000 mg/h | INTRAVENOUS | Status: DC

## 2023-12-15 MED ORDER — DILTIAZEM HCL-DEXTROSE 125-5 MG/125ML-% IV SOLN (PREMIX)
INTRAVENOUS | Status: AC
  Administered 2023-12-15: 5 mg/h via INTRAVENOUS
  Filled 2023-12-15: qty 125

## 2023-12-15 NOTE — Progress Notes (Signed)
 Pharmacy Antibiotic Note  Jasmin Miller is a 83 y.o. female admitted on 12/12/2023 with pneumonia.  Pharmacy has been consulted for Unasyn  dosing for aspiration PNA. 12/15/2023 Tm 100.6, SCr down to 1.21, CrCl ~ 31 ml/min  Plan: Increase Unasyn  to 3 gm IV q8 hrs F/u renal function & culture data  Height: 5' 1 (154.9 cm) Weight: 66.7 kg (147 lb 0.8 oz) IBW/kg (Calculated) : 47.8  Temp (24hrs), Avg:99.5 F (37.5 C), Min:98.6 F (37 C), Max:100.6 F (38.1 C)  Recent Labs  Lab 12/12/23 1543 12/12/23 1710 12/13/23 0317 12/13/23 0946 12/13/23 1512 12/13/23 1712 12/14/23 0457 12/14/23 1743 12/15/23 0625  WBC 6.0  --  29.5* 20.1* 18.7* 16.7* 17.5* 16.6*  --   CREATININE 1.37*  --  1.66* 1.80*  --   --  1.91*  --  1.21*  LATICACIDVEN  --  13.2* 3.8* 2.5*  --   --   --   --   --     Estimated Creatinine Clearance: 31.4 mL/min (A) (by C-G formula based on SCr of 1.21 mg/dL (H)).    No Known Allergies  Antimicrobials this admission: 1/6 Unasyn >> Dose adjustments this admission:  Microbiology results: 1/5 MRSA neg 1/4 BCx2: ngtd 1/6  TA:   Thank you for allowing pharmacy to be a part of this patient's care.  [name] IVAR Edison, Pharm.D Use secure chat for questions 12/15/2023 7:28 AM

## 2023-12-15 NOTE — Progress Notes (Signed)
 Afternoon rounds  Was weaning earlier in morning and actually had fairly good WOB and F/VT ratio. We had also managed to get her off the norepi.  Early in afternoon ~ 1pm pt went into AF w/ RVR. At that point she was off from her precedex  as well. The precedex  was resumed but I think the rapid HR was more from fatigue and work of weaning and not necessarily agitation. She was placed back on full support. Required cardiazem load and then gtt. Also got additional BB IV for on-going af w/ RVR and SBP in 180s. Again she demonstrated some work of breathing but not overtly agitated.    At time of this documentation now in CVR but BP in high 70s, low 80s. I think mostly this is medication related.   Plan Dc scheduled BB Dc CCB,start VT w/ BP hold parameters  Cont dex for now w/ plan to wean in am again  After today I am concerned that this may be a prolonged recovery given how weak she is. Discussed at bedside with daughter. Even discussed potential long road for recovery in which case the idea of trach was introduced as an option if she progresses but remains too weak to get off vent   Jasmin Miller Banner ACNP-BC [facility] Pulmonary/Critical Care Pager # [phone] OR # [phone] if no answer

## 2023-12-15 NOTE — Progress Notes (Signed)
 NAME:  Jasmin Miller, MRN:  [id number], DOB:  May 15, 1941, LOS: 3 ADMISSION DATE:  12/12/2023, CONSULTATION DATE:  12/12/23 REFERRING MD:  Lamar Salen, MD CHIEF COMPLAINT:  Hypotension, acute blood loss anemia   History of Present Illness:  83 year old female on with pmhx below who presented for shortness of breath x 1 day.  Labs significant for Hg 4, Plt 246, iSTAT LA 13, K 6.0, CO2 7 BUN/Cr 19/1.37. Trop 133. BNP 2599.  VBG 7.06/30/50/7.5.  CXR no infiltrate effusion or edema. Influenza, RSV and covid negative.  In the ED patient became hypotensive and loss of mental status. Levophed  gtt started. Patient intubated in the ED due to airway protection due to obtunded status. 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  Progressed to PEA Cardiac arrest required CPR x 10 min (estimated) and achieved ROSC in the ED. Placed on epi gtt. Additional ER interventions: PRBC x fusion (4 Units ordered), in addition to crystalloid resuscitation. Placed on Norepi, phenylephrine  and epi gtt. Cultures sent. EDP spoke to family. No further CPR but cont supportive care. Remained hypotensive and in low perfusion state post resuscitation PCCM asked to admit   Family at bedside. Patient has shown decline over past 2 months with poor oral intake, only eating soup. She has remained active in cooking and cleaning at home on her own. Has become very weak over the past 2 months.   Pertinent  Medical History  DM2, HTN, , CHF, IDA, atrial fibrillation on Eliquis   Significant Hospital Events: Including procedures, antibiotic start and stop dates in addition to other pertinent events   1/4 Presented w/ CC SOB, had lactate 13, hgb 4, BNP 2599. D-dimer negative While in ER had loss of LOC, became hypoxic, intubated. Had cardiac arrest. ACLS provided. Time to ROSC estimated ~10  minutes. Remained in low perfusion state in spite of blood, fluid resuscitation and pressors. Made DNR in ER should arrest occur again. PCCM asked to admit.  1/5 epi off. Completed total 4 PRBC and 2 FFP. More awake.  1/6 acidosis resolved. LFTs improving. H&H stable. PLTs stable/improving. Renal fxn a little worse. Still on pressors. Still on vent. Started on fent gtt on 5th. Less responsive. Changed fent to dex. Placed left internal jugular CVL. Co-ox > 70. CVP suggesting adequate preload. Started unasyn  empirically for fever, leukocytosis and elevated PCT  1/7 weaning Norepi, weaning dex.   Interim History / Subjective:  A little more awake   Objective   Blood pressure (!) 137/58, pulse 98, temperature (!) 100.4 F (38 C), resp. rate (!) 21, height 5' 1 (1.549 m), weight 66.7 kg, SpO2 100%. CVP:  [8 mmHg-12 mmHg] 10 mmHg  Vent Mode: PRVC FiO2 (%):  [30 %] 30 % Set Rate:  [18 bmp] 18 bmp Vt Set:  [390 mL] 390 mL PEEP:  [5 cmH20] 5 cmH20 Plateau Pressure:  [10 cmH20-14 cmH20] 12 cmH20   Intake/Output Summary (Last 24 hours) at 12/15/2023 0725 Last data filed at 12/15/2023 9364 Gross per 24 hour  Intake 1429.96 ml  Output 2250 ml  Net -820.04 ml   Filed Weights   12/13/23 0600 12/15/23 0428  Weight: 61.9 kg 66.7 kg    Physical Exam: General this is a 83 year old female. Still sedated but responds today where as on 1/6 did not respond HENT NCAT orally intubated. Left internal jugular CVL good  position Pulm dec bases. Currently on PSV VTs 400s Card rrr Abd soft Ext now w/ generalized anasarca. + pulses brisk CR Gu cl yellow  Neuro will now open eyes to voice. Not interactive but tracks. Still not moving extremities   Resolved Hospital Problem list   S/p in-hospital cardiac arrest 2/2 severe anemia and metabolic acidosis Hemorrhagic shock Lactic acidosis  Metabolic acidosis  Assessment & Plan:    Circulatory shock post arrest.  Initially hemorrhagic and acidosis mediated.  Now etiology of on-going pressor requirements not clear. Still NE dep. Co-ox and cvp at goal. I think some of this is medication related.  Plan Change BP goal to SBP > 100 Wean NE for goal  Keep euvolemic  Fever w/ leukocytosis.  Had elevated PCT. Resp cultures sent although no clear infiltrate on cxr Plan Cont empiric Unasyn  day 2/5 for now unless culture data leads us  a different path  Ventilator management  Remains on vent s/p arrest 2/2 encephalopathy  Plan Cont full vent support Vap bundle Daily assessment for readiness to wean  Needs diuretics once we can get her off pressors  Acute blood loss anemia; FOB +  S/p PRBC x 4U &  FFP x2; Hgb stable since 1/5 Plan Will not resume DOAC or asa Will need GI eval if improves to point can be transferred out of ICU   Acute on chronic renal failure CKD Stage 3b - GFR 30 to 44 (Moderately to severely decreased)  normalized Plan Keep euvolemic Renal dose meds Strict I&O Am chem   Hypophosphatemia  Plan Replace  Acute metabolic encephalopathy: Became progressively altered while in ER. Likely 2/2 to profound anemia and acidosis.  Plan Treat fevers RASS goal 0 try to wean dex  Keep euglycemic Supportive care   Atrial fibrillation/hx NSVT Chronic systolic heart failure (EF 40-45%) HTN Tele Hold antihypertensive agents: Lopressor , entresto  and diuretics: spironolactone  given on-going hypotension Hold eliquis  in setting of bleed; No longer systemic AC candidate given this is at least the second life threatening bleed  Thrombocytopenia -stable/improved Plan Trend (pending this am)  Shock liver s/p arrest Plan Continuing to normalize. Can check PRN  DM2 ->at goal  Plan CBG q4h with SSI Goal 140-180   Abnormal CT head. 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 This is nonspecific, but could represent a meningioma. Plan F/u out pt if survives    Best Practice (right click and Reselect all SmartList Selections daily)   Diet/type: tubefeeds DVT prophylaxis other Pressure ulcer(s): pressure ulcer assessment deferred  GI prophylaxis: H2B Lines: N/A Foley:  Yes, and it is still needed Code Status:  full code Last date of multidisciplinary goals of care discussion [1/4 discussed with family at bedside, agree with DNR but want to continue aggressive care at this time]  Critical care time:  32 min     Maude FORBES Banner ACNP-BC [facility] Pulmonary/Critical Care Pager # [phone] OR # [phone] if no answer

## 2023-12-15 NOTE — Plan of Care (Signed)
  Problem: Clinical Measurements: Goal: Diagnostic test results will improve Outcome: Progressing Goal: Respiratory complications will improve Outcome: Progressing Note: Able to wean off ventilator from 8am to about 1230 until vital signs became unstable and patient needed to be sedated again and put back on PRVC. Goal: Cardiovascular complication will be avoided Outcome: Not Progressing Note: Patient had to be put on cardizem  gtt due to unstable vital signs from weaning off ventilator and patient became tired.  Once put back on PRVC, cardizem  gtt started to lower HR and BP.

## 2023-12-16 ENCOUNTER — Inpatient Hospital Stay (HOSPITAL_COMMUNITY): Payer: 59

## 2023-12-16 DIAGNOSIS — I469 Cardiac arrest, cause unspecified: Secondary | ICD-10-CM | POA: Diagnosis not present

## 2023-12-16 LAB — GLUCOSE, CAPILLARY
Glucose-Capillary: 117 mg/dL — ABNORMAL HIGH (ref 70–99)
Glucose-Capillary: 113 mg/dL — ABNORMAL HIGH (ref 70–99)
Glucose-Capillary: 98 mg/dL (ref 70–99)
Glucose-Capillary: 145 mg/dL — ABNORMAL HIGH (ref 70–99)
Glucose-Capillary: 129 mg/dL — ABNORMAL HIGH (ref 70–99)
Glucose-Capillary: 144 mg/dL — ABNORMAL HIGH (ref 70–99)

## 2023-12-16 LAB — COMPREHENSIVE METABOLIC PANEL
ALT: 115 U/L — ABNORMAL HIGH (ref 0–44)
AST: 39 U/L (ref 15–41)
Albumin: 2.4 g/dL — ABNORMAL LOW (ref 3.5–5.0)
Alkaline Phosphatase: 114 U/L (ref 38–126)
Anion gap: 9 (ref 5–15)
BUN: 41 mg/dL — ABNORMAL HIGH (ref 8–23)
CO2: 23 mmol/L (ref 22–32)
Calcium: 7.8 mg/dL — ABNORMAL LOW (ref 8.9–10.3)
Chloride: 112 mmol/L — ABNORMAL HIGH (ref 98–111)
Creatinine, Ser: 1.01 mg/dL — ABNORMAL HIGH (ref 0.44–1.00)
GFR, Estimated: 56 mL/min — ABNORMAL LOW (ref >60)
Glucose, Bld: 184 mg/dL — ABNORMAL HIGH (ref 70–99)
Potassium: 3.6 mmol/L (ref 3.5–5.1)
Sodium: 144 mmol/L (ref 135–145)
Total Bilirubin: 0.8 mg/dL (ref 0.0–1.2)
Total Protein: 5.5 g/dL — ABNORMAL LOW (ref 6.5–8.1)

## 2023-12-16 LAB — PHOSPHORUS
Phosphorus: 2.7 mg/dL (ref 2.5–4.6)
Phosphorus: 3 mg/dL (ref 2.5–4.6)

## 2023-12-16 LAB — TYPE AND SCREEN
ABO/RH(D): B POS
Antibody Screen: POSITIVE
Donor AG Type: NEGATIVE
Donor AG Type: NEGATIVE
Donor AG Type: NEGATIVE
Donor AG Type: NEGATIVE
Unit division: 0
Unit division: 0
Unit division: 0
Unit division: 0

## 2023-12-16 LAB — BPAM RBC
Blood Product Expiration Date: 202502022359
Blood Product Expiration Date: 202502022359
Blood Product Expiration Date: 202502032359
Blood Product Expiration Date: 202502042359
ISSUE DATE / TIME: 202501041730
ISSUE DATE / TIME: 202501041730
ISSUE DATE / TIME: 202501042100
Unit Type and Rh: 5100
Unit Type and Rh: 5100
Unit Type and Rh: 9500
Unit Type and Rh: 9500

## 2023-12-16 LAB — PROTIME-INR
INR: 1.2 (ref 0.8–1.2)
Prothrombin Time: 15.7 s — ABNORMAL HIGH (ref 11.4–15.2)

## 2023-12-16 LAB — CULTURE, RESPIRATORY W GRAM STAIN: Gram Stain: NONE SEEN

## 2023-12-16 LAB — MAGNESIUM
Magnesium: 2.3 mg/dL (ref 1.7–2.4)
Magnesium: 2.6 mg/dL — ABNORMAL HIGH (ref 1.7–2.4)

## 2023-12-16 MED ORDER — FENTANYL CITRATE PF 50 MCG/ML IJ SOSY
50.0000 ug | PREFILLED_SYRINGE | Freq: Once | INTRAMUSCULAR | Status: AC
  Administered 2023-12-16: 50 ug via INTRAVENOUS

## 2023-12-16 MED ORDER — METOPROLOL TARTRATE 25 MG/10 ML ORAL SUSPENSION
100.0000 mg | Freq: Two times a day (BID) | ORAL | Status: DC
  Filled 2023-12-16: qty 40

## 2023-12-16 MED ORDER — POTASSIUM CHLORIDE 20 MEQ PO PACK
40.0000 meq | PACK | Freq: Once | ORAL | Status: AC
  Administered 2023-12-16: 40 meq
  Filled 2023-12-16: qty 2

## 2023-12-16 MED ORDER — FENTANYL CITRATE PF 50 MCG/ML IJ SOSY
PREFILLED_SYRINGE | INTRAMUSCULAR | Status: AC
  Administered 2023-12-16: 50 ug via INTRAVENOUS
  Filled 2023-12-16: qty 1

## 2023-12-16 MED ORDER — METOPROLOL TARTRATE 25 MG PO TABS: 100.0000 mg | ORAL_TABLET | Freq: Two times a day (BID) | ORAL | Status: DC

## 2023-12-16 MED ORDER — METOPROLOL TARTRATE 25 MG/10 ML ORAL SUSPENSION
50.0000 mg | Freq: Two times a day (BID) | ORAL | Status: DC
  Administered 2023-12-16 – 2023-12-19 (×5): 50 mg
  Filled 2023-12-16 (×7): qty 20

## 2023-12-16 MED ORDER — FENTANYL CITRATE PF 50 MCG/ML IJ SOSY: 50.0000 ug | PREFILLED_SYRINGE | Freq: Once | INTRAMUSCULAR | Status: AC

## 2023-12-16 MED ORDER — FENTANYL CITRATE PF 50 MCG/ML IJ SOSY
50.0000 ug | PREFILLED_SYRINGE | INTRAMUSCULAR | Status: DC | PRN
  Administered 2023-12-16 – 2023-12-24 (×26): 50 ug via INTRAVENOUS
  Filled 2023-12-16 (×31): qty 1

## 2023-12-16 NOTE — Progress Notes (Signed)
 Patient transported to MRI and back the ICU room 1224 without complications.

## 2023-12-16 NOTE — Progress Notes (Signed)
 NAME:  Jasmin Miller, MRN:  [id number], DOB:  [birth year], LOS: 4 ADMISSION DATE:  12/12/2023, CONSULTATION DATE:  12/12/23 REFERRING MD:  Lamar Salen, MD CHIEF COMPLAINT:  Hypotension, acute blood loss anemia   History of Present Illness:  83 year old female on with pmhx below who presented for shortness of breath x 1 day.  Labs significant for Hg 4, Plt 246, iSTAT LA 13, K 6.0, CO2 7 BUN/Cr 19/1.37. Trop 133. BNP 2599.  VBG 7.06/30/50/7.5.  CXR no infiltrate effusion or edema. Influenza, RSV and covid negative.  In the ED patient became hypotensive and loss of mental status. Levophed  gtt started. Patient intubated in the ED due to airway protection due to obtunded status. 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  Progressed to PEA Cardiac arrest required CPR x 10 min (estimated) and achieved ROSC in the ED. Placed on epi gtt. Additional ER interventions: PRBC x fusion (4 Units ordered), in addition to crystalloid resuscitation. Placed on Norepi, phenylephrine  and epi gtt. Cultures sent. EDP spoke to family. No further CPR but cont supportive care. Remained hypotensive and in low perfusion state post resuscitation PCCM asked to admit   Family at bedside. Patient has shown decline over past 2 months with poor oral intake, only eating soup. She has remained active in cooking and cleaning at home on her own. Has become very weak over the past 2 months.   Pertinent  Medical History  DM2, HTN, , CHF, IDA, atrial fibrillation on Eliquis   Significant Hospital Events: Including procedures, antibiotic start and stop dates in addition to other pertinent events   1/4 Presented w/ CC SOB, had lactate 13, hgb 4, BNP 2599. D-dimer negative While in ER had loss of LOC, became hypoxic, intubated. Had cardiac arrest. ACLS provided. Time to ROSC estimated ~10  minutes. Remained in low perfusion state in spite of blood, fluid resuscitation and pressors. Made DNR in ER should arrest occur again. PCCM asked to admit.  1/5 epi off. Completed total 4 PRBC and 2 FFP. More awake.  1/6 acidosis resolved. LFTs improving. H&H stable. PLTs stable/improving. Renal fxn a little worse. Still on pressors. Still on vent. Started on fent gtt on 5th. Less responsive. Changed fent to dex. Placed left internal jugular CVL. Co-ox > 70. CVP suggesting adequate preload. Started unasyn  empirically for fever, leukocytosis and elevated PCT  1/7 weaning Norepi, weaning dex.  1/8 No acute issues overnight, low dose pressors briefly needed. Remains on low dose precedex    Interim History / Subjective:  Responds to verbal stimuli   Objective   Blood pressure (!) 133/58, pulse 91, temperature 100 F (37.8 C), resp. rate (!) 27, height 5' 1 (1.549 m), weight 68 kg, SpO2 100%. CVP:  [7 mmHg-8 mmHg] 7 mmHg  Vent Mode: PRVC FiO2 (%):  [30 %] 30 % Set Rate:  [18 bmp] 18 bmp Vt Set:  [390 mL] 390 mL PEEP:  [5 cmH20] 5 cmH20 Pressure Support:  [5 cmH20] 5 cmH20 Plateau Pressure:  [12 cmH20-13 cmH20] 12 cmH20   Intake/Output Summary (Last 24 hours) at 12/16/2023 0703 Last data filed at 12/16/2023 0513 Gross per 24 hour  Intake 1773.85 ml  Output 1700 ml  Net 73.85 ml   Filed Weights   12/13/23 0600 12/15/23 0428 12/16/23 0500  Weight: 61.9 kg 66.7 kg 68 kg    Physical Exam: General: Acute on  chronically ill appearing elderly female lying in bed on mechanical ventilation, in NAD HEENT: ETT, MM pink/moist, PERRL,  Neuro: Arouses to verbal stimuli CV: s1s2 regular rate and rhythm, no murmur, rubs, or gallops,  PULM: No increased work of breathing, tolerating vent, no added breath sounds  GI: soft, bowel sounds active in all 4 quadrants, non-tender, non-distended, tolerating TF Extremities: warm/dry, no edema  Skin: no rashes or lesions  Resolved Hospital Problem list   S/p  in-hospital cardiac arrest 2/2 severe anemia and metabolic acidosis Hemorrhagic shock Lactic acidosis  Metabolic acidosis  Circulatory shock post arrest.  Hypophosphatemia   Assessment & Plan:   Fever w/ leukocytosis.  -Had elevated PCT. Resp cultures sent although no clear infiltrate on cxr P: Follow culture  Empiric Unasyn   Trend CBC and fever curve   Ventilator management  -Remains on vent s/p arrest 2/2 encephalopathy  P:  Continue ventilator support with lung protective strategies  Wean PEEP and FiO2 for sats greater than 90%. Head of bed elevated 30 degrees. Plateau pressures less than 30 cm H20.  Follow intermittent chest x-ray and ABG.   SAT/SBT as tolerated, mentation preclude extubation  Ensure adequate pulmonary hygiene  Follow cultures  VAP bundle in place  PAD protocol Antibiotics as above  Optimize volume status   Hypotension  -Initially hemorrhagic and acidosis mediated.hemodynamics improving, as of am 1/8 levo off.  P: Follow need to titrate rate control meds  Follow volume status  Goal of euvolemia  Can consider midodrine   Acute blood loss anemia; FOB +  -S/p PRBC x 4U &  FFP x2; Hgb stable since 1/5 P: Continue to hold DOAC and ASA  GI eval once more stable Monitor for sign of ongoing bleeding   Acute kidney injury superimposed on CKD stage 3b - improved -Baseline GFR 30-44,  P: Follow renal function  Monitor urine output Trend Bmet Avoid nephrotoxins Ensure adequate renal perfusion   Acute metabolic encephalopathy -Became progressively altered while in ER. Likely 2/2 to profound anemia and acidosis.  P: Maintain neuro protective measures; goal for eurothermia, euglycemia, eunatermia, normoxia, and PCO2 goal of 35-40 Nutrition and bowel regiment  Aspirations precautions  Minimize sedation   Atrial fibrillation/hx NSVT -No longer systemic AC candidate given this is at least the second life threatening bleed Chronic systolic heart  failure (EF 40-45%) HTN P: Hold anticoagulation Continuous telemetry  Hold home antihypertensives  Continue oral Cardizem    Thrombocytopenia -stable/improved P: Trend CBC  Plt goal > 10 Monitor for bleeding   Shock liver s/p arrest - improving  P: Intermittently trend LFTs   DM2 ->at goal  P: SSI  CBG checks q4 CBG goal 140-180  Abnormal CT head -Not likely etiology of encephalopathy CT showed Chronic appearing, 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 This is nonspecific, but could represent a meningioma. P: Outpatient follow up     Best Practice (right click and Reselect all SmartList Selections daily)   Diet/type: tubefeeds DVT prophylaxis other Pressure ulcer(s): pressure ulcer assessment deferred  GI prophylaxis: H2B Lines: N/A Foley:  Yes, and it is still needed Code Status:  full code Last date of multidisciplinary goals of care discussion [1/4 discussed with family at bedside, agree with DNR but want to continue aggressive care at this time]  Critical care time:   CRITICAL CARE Performed by: Bensen Chadderdon D.  Harris  Total critical care time: 35 minutes  Critical care time was exclusive of separately billable procedures and treating other patients.  Critical care was necessary to treat or prevent imminent or life-threatening deterioration.  Critical care was time spent personally by me on the following activities: development of treatment plan with patient and/or surrogate as well as nursing, discussions with consultants, evaluation of patient's response to treatment, examination of patient, obtaining history from patient or surrogate, ordering and performing treatments and interventions, ordering and review of laboratory studies, ordering and review of radiographic studies, pulse oximetry and re-evaluation of patient's  condition.  Andora Krull D. Harris, NP-C [facility] Personal contact information can be found on Amion  If no contact or response made please call [phone] 12/16/2023, 7:11 AM

## 2023-12-16 NOTE — Progress Notes (Signed)
 [facility] ADULT ICU REPLACEMENT PROTOCOL   The patient does apply for the [facility] Adult ICU Electrolyte Replacment Protocol based on the criteria listed below:   1.Exclusion criteria: TCTS, ECMO, Dialysis, and Myasthenia Gravis patients 2. Is GFR >/= 30 ml/min? Yes.    Patient's GFR today is 56 3. Is SCr </= 2? Yes.   Patient's SCr is 1.01 mg/dL 4. Did SCr increase >/= 0.5 in 24 hours? No. 5.Pt's weight >40kg  Yes.   6. Abnormal electrolyte(s): K+ = 3.6  7. Electrolytes replaced per protocol 8.  Call MD STAT for K+ </= 2.5, Phos </= 1, or Mag </= 1 Physician:  Haze, eMD  Jasmin Miller 12/16/2023 5:46 AM

## 2023-12-17 DIAGNOSIS — I469 Cardiac arrest, cause unspecified: Secondary | ICD-10-CM | POA: Diagnosis not present

## 2023-12-17 LAB — CULTURE, BLOOD (ROUTINE X 2)
Culture: NO GROWTH
Culture: NO GROWTH

## 2023-12-17 LAB — GLUCOSE, CAPILLARY
Glucose-Capillary: 144 mg/dL — ABNORMAL HIGH (ref 70–99)
Glucose-Capillary: 141 mg/dL — ABNORMAL HIGH (ref 70–99)
Glucose-Capillary: 122 mg/dL — ABNORMAL HIGH (ref 70–99)
Glucose-Capillary: 141 mg/dL — ABNORMAL HIGH (ref 70–99)
Glucose-Capillary: 125 mg/dL — ABNORMAL HIGH (ref 70–99)
Glucose-Capillary: 153 mg/dL — ABNORMAL HIGH (ref 70–99)

## 2023-12-17 LAB — COMPREHENSIVE METABOLIC PANEL
ALT: 73 U/L — ABNORMAL HIGH (ref 0–44)
AST: 24 U/L (ref 15–41)
Albumin: 2.2 g/dL — ABNORMAL LOW (ref 3.5–5.0)
Alkaline Phosphatase: 101 U/L (ref 38–126)
Anion gap: 5 (ref 5–15)
BUN: 42 mg/dL — ABNORMAL HIGH (ref 8–23)
CO2: 24 mmol/L (ref 22–32)
Calcium: 7.9 mg/dL — ABNORMAL LOW (ref 8.9–10.3)
Chloride: 119 mmol/L — ABNORMAL HIGH (ref 98–111)
Creatinine, Ser: 0.82 mg/dL (ref 0.44–1.00)
GFR, Estimated: >60 mL/min (ref >60)
Glucose, Bld: 165 mg/dL — ABNORMAL HIGH (ref 70–99)
Potassium: 4.1 mmol/L (ref 3.5–5.1)
Sodium: 148 mmol/L — ABNORMAL HIGH (ref 135–145)
Total Bilirubin: 0.6 mg/dL (ref 0.0–1.2)
Total Protein: 5.2 g/dL — ABNORMAL LOW (ref 6.5–8.1)

## 2023-12-17 LAB — CBC
HCT: 24.8 % — ABNORMAL LOW (ref 36.0–46.0)
Hemoglobin: 7.4 g/dL — ABNORMAL LOW (ref 12.0–15.0)
MCH: 26.3 pg (ref 26.0–34.0)
MCHC: 29.8 g/dL — ABNORMAL LOW (ref 30.0–36.0)
MCV: 88.3 fL (ref 80.0–100.0)
Platelets: 101 10*3/uL — ABNORMAL LOW (ref 150–400)
RBC: 2.81 MIL/uL — ABNORMAL LOW (ref 3.87–5.11)
RDW: 23.9 % — ABNORMAL HIGH (ref 11.5–15.5)
WBC: 8.5 10*3/uL (ref 4.0–10.5)
nRBC: 0.6 % — ABNORMAL HIGH (ref 0.0–0.2)

## 2023-12-17 LAB — PHOSPHORUS: Phosphorus: 3.5 mg/dL (ref 2.5–4.6)

## 2023-12-17 LAB — MAGNESIUM: Magnesium: 2.7 mg/dL — ABNORMAL HIGH (ref 1.7–2.4)

## 2023-12-17 MED ORDER — SODIUM CHLORIDE 0.9 % IV SOLN
3.0000 g | Freq: Four times a day (QID) | INTRAVENOUS | Status: AC
Start: 2023-12-17 — End: 2023-12-22
  Administered 2023-12-17 – 2023-12-20 (×15): 3 g via INTRAVENOUS
  Filled 2023-12-17 (×14): qty 8

## 2023-12-17 NOTE — Progress Notes (Signed)
 PHARMACY NOTE:  ANTIMICROBIAL RENAL DOSAGE ADJUSTMENT  Current antimicrobial regimen includes a mismatch between antimicrobial dosage and estimated renal function.  As per policy approved by the Pharmacy & Therapeutics and Medical Executive Committees, the antimicrobial dosage will be adjusted accordingly.  Current antimicrobial dosage: Unasyn  3g q8  Indication: aspiration pneumonia  Renal Function:  Estimated Creatinine Clearance: 46.7 mL/min (by C-G formula based on SCr of 0.82 mg/dL). []      On intermittent HD, scheduled: []      On CRRT    Antimicrobial dosage has been changed to:  Unasyn  3g q6  Additional comments:   Thank you for allowing pharmacy to be a part of this patient's care.  Britta Soulier Newman, [facility] 12/17/2023 7:28 AM

## 2023-12-17 NOTE — Progress Notes (Signed)
 NAME:  Jasmin Miller, MRN:  [id number], DOB:  1941-01-12, LOS: 5 ADMISSION DATE:  12/12/2023, CONSULTATION DATE:  12/12/23 REFERRING MD:  Lamar Salen, MD CHIEF COMPLAINT:  Hypotension, acute blood loss anemia   History of Present Illness:  83 year old female on with pmhx below who presented for shortness of breath x 1 day.  Labs significant for Hg 4, Plt 246, iSTAT LA 13, K 6.0, CO2 7 BUN/Cr 19/1.37. Trop 133. BNP 2599.  VBG 7.06/30/50/7.5.  CXR no infiltrate effusion or edema. Influenza, RSV and covid negative.  In the ED patient became hypotensive and loss of mental status. Levophed  gtt started. Patient intubated in the ED due to airway protection due to obtunded status. 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  Progressed to PEA Cardiac arrest required CPR x 10 min (estimated) and achieved ROSC in the ED. Placed on epi gtt. Additional ER interventions: PRBC x fusion (4 Units ordered), in addition to crystalloid resuscitation. Placed on Norepi, phenylephrine  and epi gtt. Cultures sent. EDP spoke to family. No further CPR but cont supportive care. Remained hypotensive and in low perfusion state post resuscitation PCCM asked to admit   Family at bedside. Patient has shown decline over past 2 months with poor oral intake, only eating soup. She has remained active in cooking and cleaning at home on her own. Has become very weak over the past 2 months.   Pertinent  Medical History  DM2, HTN, , CHF, IDA, atrial fibrillation on Eliquis   Significant Hospital Events: Including procedures, antibiotic start and stop dates in addition to other pertinent events   1/4 Presented w/ CC SOB, had lactate 13, hgb 4, BNP 2599. D-dimer negative While in ER had loss of LOC, became hypoxic, intubated. Had cardiac arrest. ACLS provided. Time to ROSC estimated ~10  minutes. Remained in low perfusion state in spite of blood, fluid resuscitation and pressors. Made DNR in ER should arrest occur again. PCCM asked to admit.  1/5 epi off. Completed total 4 PRBC and 2 FFP. More awake.  1/6 acidosis resolved. LFTs improving. H&H stable. PLTs stable/improving. Renal fxn a little worse. Still on pressors. Still on vent. Started on fent gtt on 5th. Less responsive. Changed fent to dex. Placed left internal jugular CVL. Co-ox > 70. CVP suggesting adequate preload. Started unasyn  empirically for fever, leukocytosis and elevated PCT  1/7 weaning Norepi, weaning dex.  1/8 No acute issues overnight, low dose pressors briefly needed. Remains on low dose precedex   1/9 MRI brain MRI brain obtained 1/8 in the setting of multiple failed attempts to wean with poor observed mentation/alertness with lightening sedation.  MRI consistent with multiple scattered infarcts likely in the setting of hypoperfusion in the setting of recent cardiac arrest  Interim History / Subjective:  Sedated on Precedex  drip   Objective   Blood pressure (!) 93/41, pulse 71, temperature 98.7 F (37.1 C), temperature source Axillary, resp. rate 20, height 5' 1 (1.549 m), weight 68 kg, SpO2 100%. CVP:  [5 mmHg-24 mmHg] 6 mmHg  Vent Mode: PRVC FiO2 (%):  [30 %] 30 % Set Rate:  [18 bmp] 18 bmp Vt Set:  [390 mL] 390 mL PEEP:  [5 cmH20] 5 cmH20 Plateau Pressure:  [14 cmH20-16 cmH20] 16 cmH20   Intake/Output Summary (Last 24 hours) at 12/17/2023 0721 Last data filed at 12/17/2023 0549 Gross per 24 hour  Intake  314.09 ml  Output 1076 ml  Net -761.91 ml   Filed Weights   12/13/23 0600 12/15/23 0428 12/16/23 0500  Weight: 61.9 kg 66.7 kg 68 kg    Physical Exam: General: Acute on chronically ill appearing elderly female lying in bed on mechanical ventilation, in NAD HEENT: ETT, MM pink/moist, PERRL,  Neuro: Sedated with Precedex   CV: s1s2 regular rate and rhythm, no murmur, rubs, or gallops,  PULM:   Clear to auscultation, no increased work of breathing, no added breath sounds, tolerating vent  GI: soft, bowel sounds active in all 4 quadrants, non-tender, non-distended, tolerating  Extremities: warm/dry, no edema  Skin: no rashes or lesions  Resolved Hospital Problem list   S/p in-hospital cardiac arrest 2/2 severe anemia and metabolic acidosis Hemorrhagic shock Lactic acidosis  Metabolic acidosis  Circulatory shock post arrest.  Hypophosphatemia  AKI   Assessment & Plan:   Subacute ischemic infarcts, watershed in distribution in the setting of cardiac arrest and hypoperfusion P: -MRI brain obtained 1/8 in the setting of multiple failed attempts to wean with poor observed mentation/alertness with lightening sedation.  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  They are discussing options currently Continue supportive care Minimize sedation as able Neuroprotective measures  Fever w/ leukocytosis.  -Had elevated PCT. Resp cultures sent although no clear infiltrate on cxr P: Blood and sputum cultures remain with no growth as of 1/9 Remains on empiric Unasyn  Trend CBC and fever curve  Ventilator management  -Remains on vent s/p arrest 2/2 encephalopathy  P:  Continue ventilator support with lung protective strategies  Wean PEEP and FiO2 for sats greater than 90%. Head of bed elevated 30 degrees. Plateau pressures less than 30 cm H20.  Follow intermittent chest x-ray and ABG.   SAT/SBT as tolerated, mentation preclude extubation  Ensure adequate pulmonary hygiene  Follow cultures  VAP bundle in place  PAD protocol  Acute blood  loss anemia; FOB +  -S/p PRBC x 4U &  FFP x2; Hgb stable since 1/5 P: Hgb remains stable  Continue to trend CBC  Hgb gaol > 7  Atrial fibrillation/hx NSVT -No longer systemic AC candidate given this is at least the second life threatening bleed Chronic systolic heart failure (EF 40-45%) HTN P: Hold anticoagulation  Continuous telemetry  Hold home antihypertensives  Continue Cardizem  and Lopressor    Thrombocytopenia -stable/improved P: Continue to trend CBC  Plt goal > 10 Monitor for bleeding   Shock liver s/p arrest - improving  P: Supportive care   DM2 ->at goal  P: SSI  CBG goal 140-180 CBG check q4   Abnormal CT head -Not likely etiology of encephalopathy CT showed Chronic appearing, 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 This is nonspecific, but could represent a meningioma. P: Outpatient follow   Goals of care  Overall patients multisystem shock in the setting of cardiac arrest had been slowly improving but unfortunately her neurologic status was lagging behind. This prompted a MRI of her brain that reveled several watershed infarcts that likely occurred due to hypoperfusion. GOC discussion held with family  am 1/9 at this time they are discussing options.  Best Practice (right click and Reselect all SmartList Selections daily)   Diet/type: tubefeeds DVT prophylaxis other Pressure ulcer(s): pressure ulcer assessment deferred  GI prophylaxis: H2B Lines: N/A Foley:  Yes, and it is still needed Code Status:  full code Last date of multidisciplinary goals of care discussion 1/9  Critical care time:   CRITICAL CARE Performed by: Alexsandria Kivett D. Harris  Total critical care time: 38 minutes  Critical care time was exclusive of separately billable procedures and treating other patients.  Critical care was necessary to treat  or prevent imminent or life-threatening deterioration.  Critical care was time spent personally by me on the following activities: development of treatment plan with patient and/or surrogate as well as nursing, discussions with consultants, evaluation of patient's response to treatment, examination of patient, obtaining history from patient or surrogate, ordering and performing treatments and interventions, ordering and review of laboratory studies, ordering and review of radiographic studies, pulse oximetry and re-evaluation of patient's condition.  Trinetta Alemu D. Harris, NP-C [facility] Personal contact information can be found on Amion  If no contact or response made please call [phone] 12/17/2023, 7:21 AM

## 2023-12-18 DIAGNOSIS — R579 Shock, unspecified: Secondary | ICD-10-CM | POA: Diagnosis not present

## 2023-12-18 DIAGNOSIS — I469 Cardiac arrest, cause unspecified: Secondary | ICD-10-CM | POA: Diagnosis not present

## 2023-12-18 LAB — GLUCOSE, CAPILLARY
Glucose-Capillary: 130 mg/dL — ABNORMAL HIGH (ref 70–99)
Glucose-Capillary: 154 mg/dL — ABNORMAL HIGH (ref 70–99)
Glucose-Capillary: 148 mg/dL — ABNORMAL HIGH (ref 70–99)
Glucose-Capillary: 146 mg/dL — ABNORMAL HIGH (ref 70–99)
Glucose-Capillary: 122 mg/dL — ABNORMAL HIGH (ref 70–99)
Glucose-Capillary: 139 mg/dL — ABNORMAL HIGH (ref 70–99)

## 2023-12-18 LAB — CBC
HCT: 24.5 % — ABNORMAL LOW (ref 36.0–46.0)
Hemoglobin: 7 g/dL — ABNORMAL LOW (ref 12.0–15.0)
MCH: 25.4 pg — ABNORMAL LOW (ref 26.0–34.0)
MCHC: 28.6 g/dL — ABNORMAL LOW (ref 30.0–36.0)
MCV: 88.8 fL (ref 80.0–100.0)
Platelets: 107 10*3/uL — ABNORMAL LOW (ref 150–400)
RBC: 2.76 MIL/uL — ABNORMAL LOW (ref 3.87–5.11)
RDW: 24.7 % — ABNORMAL HIGH (ref 11.5–15.5)
WBC: 7.9 10*3/uL (ref 4.0–10.5)
nRBC: 0 % (ref 0.0–0.2)

## 2023-12-18 LAB — COMPREHENSIVE METABOLIC PANEL
ALT: 59 U/L — ABNORMAL HIGH (ref 0–44)
ALT: 57 U/L — ABNORMAL HIGH (ref 0–44)
AST: 32 U/L (ref 15–41)
AST: 38 U/L (ref 15–41)
Albumin: 2.1 g/dL — ABNORMAL LOW (ref 3.5–5.0)
Albumin: 2.1 g/dL — ABNORMAL LOW (ref 3.5–5.0)
Alkaline Phosphatase: 100 U/L (ref 38–126)
Alkaline Phosphatase: 99 U/L (ref 38–126)
Anion gap: 9 (ref 5–15)
Anion gap: 8 (ref 5–15)
BUN: 42 mg/dL — ABNORMAL HIGH (ref 8–23)
BUN: 45 mg/dL — ABNORMAL HIGH (ref 8–23)
CO2: 24 mmol/L (ref 22–32)
CO2: 25 mmol/L (ref 22–32)
Calcium: 7.9 mg/dL — ABNORMAL LOW (ref 8.9–10.3)
Calcium: 7.9 mg/dL — ABNORMAL LOW (ref 8.9–10.3)
Chloride: 119 mmol/L — ABNORMAL HIGH (ref 98–111)
Chloride: 119 mmol/L — ABNORMAL HIGH (ref 98–111)
Creatinine, Ser: 0.87 mg/dL (ref 0.44–1.00)
Creatinine, Ser: 0.96 mg/dL (ref 0.44–1.00)
GFR, Estimated: >60 mL/min (ref >60)
GFR, Estimated: 59 mL/min — ABNORMAL LOW (ref >60)
Glucose, Bld: 148 mg/dL — ABNORMAL HIGH (ref 70–99)
Glucose, Bld: 153 mg/dL — ABNORMAL HIGH (ref 70–99)
Potassium: 4.3 mmol/L (ref 3.5–5.1)
Potassium: 4 mmol/L (ref 3.5–5.1)
Sodium: 152 mmol/L — ABNORMAL HIGH (ref 135–145)
Sodium: 152 mmol/L — ABNORMAL HIGH (ref 135–145)
Total Bilirubin: 0.3 mg/dL (ref 0.0–1.2)
Total Bilirubin: 0.4 mg/dL (ref 0.0–1.2)
Total Protein: 5.1 g/dL — ABNORMAL LOW (ref 6.5–8.1)
Total Protein: 5.1 g/dL — ABNORMAL LOW (ref 6.5–8.1)

## 2023-12-18 LAB — MAGNESIUM: Magnesium: 2.5 mg/dL — ABNORMAL HIGH (ref 1.7–2.4)

## 2023-12-18 LAB — PHOSPHORUS: Phosphorus: 3.5 mg/dL (ref 2.5–4.6)

## 2023-12-18 MED ORDER — FUROSEMIDE 10 MG/ML IJ SOLN
40.0000 mg | Freq: Once | INTRAMUSCULAR | Status: AC
  Administered 2023-12-18: 40 mg via INTRAVENOUS
  Filled 2023-12-18: qty 4

## 2023-12-18 MED ORDER — FREE WATER
200.0000 mL | Status: DC
Start: 2023-12-18 — End: 2023-12-19
  Administered 2023-12-18 – 2023-12-19 (×6): 200 mL

## 2023-12-18 NOTE — Progress Notes (Addendum)
 NAME:  Jasmin Miller, MRN:  [id number], DOB:  March 21, 1941, LOS: 6 ADMISSION DATE:  12/12/2023, CONSULTATION DATE:  12/12/23 REFERRING MD:  Lamar Salen, MD CHIEF COMPLAINT:  Hypotension, acute blood loss anemia   History of Present Illness:  83 year old female on with pmhx below who presented for shortness of breath x 1 day.  Labs significant for Hg 4, Plt 246, iSTAT LA 13, K 6.0, CO2 7 BUN/Cr 19/1.37. Trop 133. BNP 2599.  VBG 7.06/30/50/7.5.  CXR no infiltrate effusion or edema. Influenza, RSV and covid negative.  In the ED patient became hypotensive and loss of mental status. Levophed  gtt started. Patient intubated in the ED due to airway protection due to obtunded status. 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  Progressed to PEA Cardiac arrest required CPR x 10 min (estimated) and achieved ROSC in the ED. Placed on epi gtt. Additional ER interventions: PRBC x fusion (4 Units ordered), in addition to crystalloid resuscitation. Placed on Norepi, phenylephrine  and epi gtt. Cultures sent. EDP spoke to family. No further CPR but cont supportive care. Remained hypotensive and in low perfusion state post resuscitation PCCM asked to admit   Family at bedside. Patient has shown decline over past 2 months with poor oral intake, only eating soup. She has remained active in cooking and cleaning at home on her own. Has become very weak over the past 2 months.   Pertinent  Medical History  DM2, HTN, , CHF, IDA, atrial fibrillation on Eliquis   Significant Hospital Events: Including procedures, antibiotic start and stop dates in addition to other pertinent events   1/4 Presented w/ CC SOB, had lactate 13, hgb 4, BNP 2599. D-dimer negative While in ER had loss of LOC, became hypoxic, intubated. Had cardiac arrest. ACLS provided. Time to ROSC estimated ~10  minutes. Remained in low perfusion state in spite of blood, fluid resuscitation and pressors. Made DNR in ER should arrest occur again. PCCM asked to admit.  1/5 epi off. Completed total 4 PRBC and 2 FFP. More awake.  1/6 acidosis resolved. LFTs improving. H&H stable. PLTs stable/improving. Renal fxn a little worse. Still on pressors. Still on vent. Started on fent gtt on 5th. Less responsive. Changed fent to dex. Placed left internal jugular CVL. Co-ox > 70. CVP suggesting adequate preload. Started unasyn  empirically for fever, leukocytosis and elevated PCT  1/7 weaning Norepi, weaning dex.  1/8 No acute issues overnight, low dose pressors briefly needed. Remains on low dose precedex . MRI brain 1. Scattered diffusion signal abnormality involving the cortical to subcortical aspects of both cerebral hemispheres as well as the cerebellum, consistent with evolving early subacute ischemic infarcts. These changes are largely watershed in distribution, and are consistent with a hypoperfusion injury related to recent cardiac arrest. Scattered foci of associated petechial hemorrhage without frank hemorrhagic transformation or significant regional mass Effect Underlying age-appropriate cerebral volume loss with mild chronic microvascular ischemic disease, with a small remote left PCA distribution infarct involving the left occipital lobe. 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 1/9 off pressors  1/10 no sig improvement   Interim History / Subjective:  Looks about the same as 48 hrs ago.   Objective   Blood pressure (!) 123/46, pulse 62, temperature 98.4 F (36.9 C), temperature source Axillary, resp. rate 20, height 5' 1 (1.549 m), weight 64.8 kg, SpO2 100%.  CVP:  [5 mmHg-14 mmHg] 9 mmHg  Vent Mode: PRVC FiO2 (%):  [30 %] 30 % Set Rate:  [18 bmp] 18 bmp Vt Set:  [390 mL] 390 mL PEEP:  [5 cmH20] 5 cmH20 Plateau Pressure:  [11 cmH20-16 cmH20] 14 cmH20    Intake/Output Summary (Last 24 hours) at 12/18/2023 0732 Last data filed at 12/18/2023 0600 Gross per 24 hour  Intake 1897.76 ml  Output 2110 ml  Net -212.24 ml   Filed Weights   12/15/23 0428 12/16/23 0500 12/18/23 0500  Weight: 66.7 kg 68 kg 64.8 kg    Physical Exam: General chronically ill appearing 83 year old female who remains on full vent support HENT NCAT does have some mild scleral edema. PERRL Pulm clear. Dec bases Card rrr Abd soft Ext generalized anasarca + pulses  Gu cl yellow Neuro will open eyes to voice. + cough w/ sxn. Cannot follow commands for me. No localization to stimuli   Resolved Hospital Problem list   S/p in-hospital cardiac arrest 2/2 severe anemia and metabolic acidosis Hemorrhagic shock Lactic acidosis  Metabolic acidosis  Circulatory shock post arrest.  Hypophosphatemia  Hypotension Medication related  Assessment & Plan:   Fever w/ leukocytosis.  -Had elevated PCT. Sputum neg. Wbc normalized.  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 Prognosis for meaningful and functional recovery very poor.  Plan Cont supportive care Try to avoid fever Keep euglycemic  Minimize sedation   Ventilator management  -Remains on vent s/p arrest 2/2 encephalopathy  Plan Cont full vent support w/ daily SBT VAP bundle PAD protocol RASS goal 0  Hypernatremia w/ total body volume overload.  Plan Lasix  x 1 for overload  Add free water  200 ml q 4 hrs Afternoon and am chem   Acute blood loss anemia; FOB +  -S/p PRBC x 4U &  FFP x2; Hgb slowly drifting down over course of hospital stay. 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 - improved  -Baseline GFR  30-44 Plan Avoid nephro toxins and renal dose meds MAP goal > 65 PRN chem   Atrial fibrillation/hx NSVT -No longer systemic AC candidate given this is at least the second life threatening bleed Plan Rate control currently on low dose BB Cont tele   Chronic systolic heart failure (EF 40-45%) HTN Plan Hold anticoagulation Continuous telemetry  BB as above   Thrombocytopenia -stable/improved Plan Trend CBC    Shock liver s/p arrest - improving  Plan Intermittently trend LFTs   DM2 plan Ssi w/ goal glucose 140-180  Advanced directives Plan DNR status Cont current supportive measures Awaiting eldest daughter to arrive from [place]. Spoke to both her other daughters Linda and Curt who at this point seem to be leaning towards comfort focus but want to have their oldest sister's input   Best Practice (right click and Reselect all SmartList Selections daily)   Diet/type: tubefeeds DVT prophylaxis other Pressure ulcer(s): pressure ulcer assessment deferred  GI prophylaxis: H2B Lines: N/A Foley:  Yes, and it is still needed Code Status:  full code Last date of multidisciplinary goals of care discussion [1/4 discussed with family at bedside, agree with DNR; given  the findings of MRI daughters now discussing GOC  Critical care time: 34 min   CRITICAL CARE

## 2023-12-18 NOTE — Progress Notes (Signed)
 Nutrition Follow-up  DOCUMENTATION CODES:   Non-severe (moderate) malnutrition in context of chronic illness  INTERVENTION:  - Continue current TF regimen: Vital 1.2 at 55 ml/h (1320 ml per day) Provides 1584 kcal, 99 gm protein, 1070 ml free water  daily   - FWF per CCM/MD. Currently ordered 200mL Q4H FWF which in addition to TF is providing 2271mL/day.   - Monitor weight trends.    NUTRITION DIAGNOSIS:   Moderate Malnutrition related to chronic illness as evidenced by mild fat depletion, mild muscle depletion. *ongoing  GOAL:   Patient will meet greater than or equal to 90% of their needs *met with TF  MONITOR:   Vent status, Labs, Weight trends, TF tolerance  REASON FOR ASSESSMENT:   Consult (Trickle feeds) Enteral/tube feeding initiation and management  ASSESSMENT:   83 year old female with PMH DM2 and HTN who presented for shortness of breath and admitted for cardiac arrest.  1/4 Admit; Intubated 1/5: Pivot started at 30mL/hr 1/6: Switched to Vital 1.2 1/8 MRI brain showing numerous watershed infarcts due to hypoperfusion  Patient remains intubated on ventilator support MV: 10.7 L/min Temp (24hrs), Avg:99 F (37.2 C), Min:97.7 F (36.5 C), Max:100.3 F (37.9 C)  Per discussion with RN, patient tolerating tube feeds well.  MRI brain from 1/8 showing numerous watershed infarcts indicating significant brain injury. Per CCM note today, family awaiting family traveling in from out of town then patient to likely transition to comfort care once all family has arrived.    Admit weight: 136# Current weight: 142# I&O's: +3.4L  Medications reviewed and include: Colace, Miralax , 200mL Q4H FWF Precedex   Labs reviewed:  Na 152 Magnesium  2.5   Diet Order:   Diet Order             Diet NPO time specified  Diet effective now                   EDUCATION NEEDS:  Education needs have been addressed  Skin:  Skin Assessment: Reviewed RN  Assessment  Last BM:  1/9 - type 6  Height:  Ht Readings from Last 1 Encounters:  12/14/23 5' 1 (1.549 m)   Weight:  Wt Readings from Last 1 Encounters:  12/18/23 64.8 kg    BMI:  Body mass index is 26.99 kg/m.  Estimated Nutritional Needs:  Kcal:  1550-1750 kcals Protein:  85-100 grams Fluid:  >/= 1.5L    Trude Ned RD, LDN Contact via Secure Chat.

## 2023-12-18 NOTE — Plan of Care (Signed)
   Problem: Nutrition: Goal: Adequate nutrition will be maintained Outcome: Progressing   Problem: Elimination: Goal: Will not experience complications related to bowel motility Outcome: Progressing Goal: Will not experience complications related to urinary retention Outcome: Progressing

## 2023-12-18 NOTE — TOC Initial Note (Signed)
 Transition of Care [facility]) - Initial/Assessment Note    Patient Details  Name: Jasmin Miller MRN: [id number] Date of Birth: 1941-11-22  Transition of [facility]) CM/SW Contact:    Sonda Manuella Quill, RN Phone Number: 12/18/2023, 2:47 PM  Clinical Narrative:                 TOC for d/c planning; pt sedated on vent; called her dtr Linda Keener [phone]); she says pt lives at home; family is not sure of the d/c and transport plan; she denies pt experiencing SDOH risks; her dtr says pt had HHRN that comes by monthly but she is not sure of the agency's name; pt has cane; she does not have home oxygen; per PC note plan to  transition to comfort care when out of town family arrives; [facility] will follow.  Expected Discharge Plan:  (TBD) Barriers to Discharge: Continued Medical Work up   Patient Goals and CMS Choice Patient states their goals for this hospitalization and ongoing recovery are:: patients dtr Linda is not sure of d/c plan          Expected Discharge Plan and Services   Discharge Planning Services: CM Consult   Living arrangements for the past 2 months: Single Family Home                                      Prior Living Arrangements/Services Living arrangements for the past 2 months: Single Family Home Lives with:: Self Patient language and need for interpreter reviewed:: Yes Do you feel safe going back to the place where you live?: Yes      Need for Family Participation in Patient Care: Yes (Comment) Care giver support system in place?: Yes (comment) Current home services: DME (cane) Criminal Activity/Legal Involvement Pertinent to Current Situation/Hospitalization: No - Comment as needed  Activities of Daily Living      Permission Sought/Granted Permission sought to share information with : Case Manager Permission granted to share information with : Yes, Verbal Permission Granted  Share Information with NAME: Case Manager     Permission  granted to share info w Relationship: Linda Keener (dtr) [phone]     Emotional Assessment Appearance:: Appears stated age Attitude/Demeanor/Rapport: Unable to Assess (unable to assess) Affect (typically observed): Unable to Assess Orientation: :  (unable to assess) Alcohol  / Substance Use: Not Applicable Psych Involvement: No (comment)  Admission diagnosis:  Cardiac arrest (HCC) [I46.9] Dyspnea, unspecified type [R06.00] Patient Active Problem List   Diagnosis Date Noted   Malnutrition of moderate degree 12/15/2023   Cardiac arrest (HCC) 12/12/2023   Left upper extremity swelling 07/07/2023   NSVT (nonsustained ventricular tachycardia) (HCC) 07/07/2023   Atrial fibrillation with controlled ventricular rate (HCC) 07/05/2023   Acute HFrEF (heart failure with reduced ejection fraction) (HCC) 07/04/2023   Atrial fibrillation with RVR (HCC) 07/04/2023   New onset a-fib (HCC) 07/01/2023   Chronic HFrEF (heart failure with reduced ejection fraction) (HCC) 06/30/2023   Non-insulin  dependent type 2 diabetes mellitus (HCC) 06/30/2023   Elevated troponin level not due to acute coronary syndrome 06/30/2023   Premature ventricular contractions 06/30/2023   Severe anemia 06/29/2023   Atrial fibrillation with controlled ventricular response (HCC) 06/29/2023   Anemia 06/28/2023   Atrial fibrillation with rapid ventricular response (HCC) 06/28/2023   Post-inflammatory hyperpigmentation 06/28/2023   CHF (congestive heart failure) (HCC) 06/28/2023   Constipation 06/28/2023   Hypokalemia 06/28/2023  PVC's (premature ventricular contractions) 06/28/2023   Elevated troponin level not due myocardial infarction 06/28/2023   Elevated brain natriuretic peptide (BNP) level 06/28/2023   Former smoker 06/28/2023   Diabetes mellitus type 2, noninsulin dependent (HCC) 06/28/2023   Benign hypertension 06/28/2023   Iron  deficiency anemia due to chronic blood loss 10/17/2021   Hyperlipidemia  associated with type 2 diabetes mellitus (HCC) 04/18/2021   AVM (arteriovenous malformation) of duodenum, 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   Acute gastritis without bleeding 07/12/2020   Acute blood loss anemia 07/11/2020   Microcytic anemia 07/10/2020   AKI (acute kidney injury) (HCC) 07/10/2020   Hyperlipidemia 02/14/2019   Murmur 02/14/2019   Exertional dyspnea 02/14/2019   Screening cholesterol level 02/14/2019   Macrocytic anemia 11/17/2018   Symptomatic anemia 11/17/2018   Unintentional weight loss 11/17/2018   Hypertension associated with diabetes (HCC) 11/17/2018   DM2 (diabetes mellitus, type 2) (HCC) 11/17/2018   PCP:  Darra Hamilton, PA-C Pharmacy:   [id number] [place], [place] - [address] AT [facility] [address] [facility] [address] [place] [address] Phone: [phone] Fax: [phone]  [facility] [address] [place] [address] Phone: [phone] Fax: [phone]     Social Drivers of Health (SDOH) Social History: SDOH Screenings   Food Insecurity: No Food Insecurity (12/18/2023)  Housing: Low Risk  (12/18/2023)  Transportation Needs: No Transportation Needs (12/18/2023)  Utilities: Not At Risk (12/18/2023)  Social Connections: Patient Unable To Answer (12/12/2023)  Tobacco Use: Medium Risk (08/07/2023)   SDOH Interventions: Food Insecurity Interventions: Intervention Not Indicated, Inpatient TOC Housing Interventions: Intervention Not Indicated, Inpatient TOC Transportation Interventions: Intervention Not Indicated, Inpatient TOC Utilities Interventions: Intervention Not Indicated, Inpatient TOC   Readmission Risk Interventions    12/18/2023    2:44 PM  Readmission Risk Prevention Plan  Transportation Screening Complete  PCP or Specialist Appt within 5-7 Days Complete  Home Care Screening Complete  Medication Review (RN CM) Complete

## 2023-12-19 DIAGNOSIS — I469 Cardiac arrest, cause unspecified: Secondary | ICD-10-CM | POA: Diagnosis not present

## 2023-12-19 LAB — BASIC METABOLIC PANEL
Anion gap: 9 (ref 5–15)
BUN: 43 mg/dL — ABNORMAL HIGH (ref 8–23)
CO2: 23 mmol/L (ref 22–32)
Calcium: 7.5 mg/dL — ABNORMAL LOW (ref 8.9–10.3)
Chloride: 118 mmol/L — ABNORMAL HIGH (ref 98–111)
Creatinine, Ser: 0.63 mg/dL (ref 0.44–1.00)
GFR, Estimated: >60 mL/min (ref >60)
Glucose, Bld: 168 mg/dL — ABNORMAL HIGH (ref 70–99)
Potassium: 3.4 mmol/L — ABNORMAL LOW (ref 3.5–5.1)
Sodium: 150 mmol/L — ABNORMAL HIGH (ref 135–145)

## 2023-12-19 LAB — CBC
HCT: 27.5 % — ABNORMAL LOW (ref 36.0–46.0)
Hemoglobin: 8 g/dL — ABNORMAL LOW (ref 12.0–15.0)
MCH: 25.4 pg — ABNORMAL LOW (ref 26.0–34.0)
MCHC: 29.1 g/dL — ABNORMAL LOW (ref 30.0–36.0)
MCV: 87.3 fL (ref 80.0–100.0)
Platelets: 159 10*3/uL (ref 150–400)
RBC: 3.15 MIL/uL — ABNORMAL LOW (ref 3.87–5.11)
RDW: 24.6 % — ABNORMAL HIGH (ref 11.5–15.5)
WBC: 15 10*3/uL — ABNORMAL HIGH (ref 4.0–10.5)
nRBC: 0 % (ref 0.0–0.2)

## 2023-12-19 LAB — GLUCOSE, CAPILLARY
Glucose-Capillary: 145 mg/dL — ABNORMAL HIGH (ref 70–99)
Glucose-Capillary: 152 mg/dL — ABNORMAL HIGH (ref 70–99)
Glucose-Capillary: 154 mg/dL — ABNORMAL HIGH (ref 70–99)
Glucose-Capillary: 159 mg/dL — ABNORMAL HIGH (ref 70–99)
Glucose-Capillary: 169 mg/dL — ABNORMAL HIGH (ref 70–99)
Glucose-Capillary: 181 mg/dL — ABNORMAL HIGH (ref 70–99)

## 2023-12-19 LAB — PHOSPHORUS: Phosphorus: 2.9 mg/dL (ref 2.5–4.6)

## 2023-12-19 LAB — MAGNESIUM: Magnesium: 2.3 mg/dL (ref 1.7–2.4)

## 2023-12-19 MED ORDER — METOPROLOL TARTRATE 25 MG PO TABS
50.0000 mg | ORAL_TABLET | Freq: Two times a day (BID) | ORAL | Status: DC
  Administered 2023-12-19 – 2023-12-23 (×8): 50 mg
  Filled 2023-12-19 (×8): qty 2

## 2023-12-19 MED ORDER — FREE WATER
400.0000 mL | Status: DC
Start: 2023-12-19 — End: 2023-12-21
  Administered 2023-12-19 – 2023-12-21 (×12): 400 mL

## 2023-12-19 MED ORDER — METOPROLOL TARTRATE 5 MG/5ML IV SOLN
INTRAVENOUS | Status: AC
  Filled 2023-12-19: qty 10

## 2023-12-19 MED ORDER — HYDRALAZINE HCL 20 MG/ML IJ SOLN
10.0000 mg | Freq: Four times a day (QID) | INTRAMUSCULAR | Status: DC | PRN
  Administered 2023-12-19 – 2023-12-24 (×3): 10 mg via INTRAVENOUS
  Filled 2023-12-19 (×6): qty 1

## 2023-12-19 NOTE — Progress Notes (Signed)
   12/19/23 0846  Adult Ventilator Settings  Vent Mode (S)  PRVC (Ended vent wean due to fatigue. Weaned on PSV 10/5 x 38 mins.)

## 2023-12-19 NOTE — Progress Notes (Signed)
 NAME:  Jasmin Miller, MRN:  [id number], DOB:  May 11, 1941, LOS: 7 ADMISSION DATE:  12/12/2023, CONSULTATION DATE:  12/12/23 REFERRING MD:  Lamar Salen, MD CHIEF COMPLAINT:  Hypotension, acute blood loss anemia   History of Present Illness:  83 year old female on with pmhx below who presented for shortness of breath x 1 day.  Labs significant for Hg 4, Plt 246, iSTAT LA 13, K 6.0, CO2 7 BUN/Cr 19/1.37. Trop 133. BNP 2599.  VBG 7.06/30/50/7.5.  CXR no infiltrate effusion or edema. Influenza, RSV and covid negative.  In the ED patient became hypotensive and loss of mental status. Levophed  gtt started. Patient intubated in the ED due to airway protection due to obtunded status. 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  Progressed to PEA Cardiac arrest required CPR x 10 min (estimated) and achieved ROSC in the ED. Placed on epi gtt. Additional ER interventions: PRBC x fusion (4 Units ordered), in addition to crystalloid resuscitation. Placed on Norepi, phenylephrine  and epi gtt. Cultures sent. EDP spoke to family. No further CPR but cont supportive care. Remained hypotensive and in low perfusion state post resuscitation PCCM asked to admit   Family at bedside. Patient has shown decline over past 2 months with poor oral intake, only eating soup. She has remained active in cooking and cleaning at home on her own. Has become very weak over the past 2 months.   Pertinent  Medical History  DM2, HTN, , CHF, IDA, atrial fibrillation on Eliquis   Significant Hospital Events: Including procedures, antibiotic start and stop dates in addition to other pertinent events   1/4 Presented w/ CC SOB, had lactate 13, hgb 4, BNP 2599. D-dimer negative While in ER had loss of LOC, became hypoxic, intubated. Had cardiac arrest. ACLS provided. Time to ROSC estimated ~10  minutes. Remained in low perfusion state in spite of blood, fluid resuscitation and pressors. Made DNR in ER should arrest occur again. PCCM asked to admit.  1/5 epi off. Completed total 4 PRBC and 2 FFP. More awake.  1/6 acidosis resolved. LFTs improving. H&H stable. PLTs stable/improving. Renal fxn a little worse. Still on pressors. Still on vent. Started on fent gtt on 5th. Less responsive. Changed fent to dex. Placed left internal jugular CVL. Co-ox > 70. CVP suggesting adequate preload. Started unasyn  empirically for fever, leukocytosis and elevated PCT  1/7 weaning Norepi, weaning dex.  1/8 No acute issues overnight, low dose pressors briefly needed. Remains on low dose precedex . MRI brain 1. Scattered diffusion signal abnormality involving the cortical to subcortical aspects of both cerebral hemispheres as well as the cerebellum, consistent with evolving early subacute ischemic infarcts. These changes are largely watershed in distribution, and are consistent with a hypoperfusion injury related to recent cardiac arrest. Scattered foci of associated petechial hemorrhage without frank hemorrhagic transformation or significant regional mass Effect Underlying age-appropriate cerebral volume loss with mild chronic microvascular ischemic disease, with a small remote left PCA distribution infarct involving the left occipital lobe. 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 1/9 off pressors  1/10 no sig improvement   Interim History / Subjective:  Precedex  1.0 0.30+5, 390 x 18 I/O+ 2.7 L total Na 144 > 148 > 152 > 152 Hgb 7.4 > 7.0   Objective   Blood pressure (!) 127/39, pulse (!) 59, temperature 99.3 F (37.4 C), temperature  source Axillary, resp. rate (!) 21, height 5' 1 (1.549 m), weight 64.8 kg, SpO2 100%. CVP:  [7 mmHg-11 mmHg] 10 mmHg  Vent Mode: PRVC FiO2 (%):  [30 %] 30 % Set Rate:  [18 bmp] 18 bmp Vt Set:  [390 mL] 390 mL PEEP:  [5 cmH20] 5  cmH20 Plateau Pressure:  [14 cmH20-18 cmH20] 15 cmH20   Intake/Output Summary (Last 24 hours) at 12/19/2023 0800 Last data filed at 12/19/2023 0533 Gross per 24 hour  Intake 1526.27 ml  Output 2090 ml  Net -563.73 ml   Filed Weights   12/15/23 0428 12/16/23 0500 12/18/23 0500  Weight: 66.7 kg 68 kg 64.8 kg    Physical Exam: General obese chronically ill-appearing woman in bed, ventilated HENT ET tube in position, some oral secretions, clear Pulm distant, clear, no wheezes or crackles Card regular, no murmur Abd obese, nondistended with positive bowel sounds Ext trace to 1+ lower extremity edema Gu cl yellow Neuro eyes open to voice and stimulation, does not track, does not follow commands on Precedex  0.5  Resolved Hospital Problem list   S/p in-hospital cardiac arrest 2/2 severe anemia and metabolic acidosis Hemorrhagic shock Lactic acidosis  Metabolic acidosis  Circulatory shock post arrest.  Hypophosphatemia  Hypotension Medication related  Assessment & Plan:   Fever w/ leukocytosis.  -Had elevated PCT. Sputum neg. Wbc normalized.  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 Prognosis for meaningful and functional recovery very poor.  Plan -Continue supportive care -Neuroprotective measures (avoid fever, euglycemia) -Minimize sedation as able, wean Precedex , goal to off on 1/11  Ventilator management  -Remains on vent s/p arrest 2/2 encephalopathy  Plan -PRVC 8 cc/kg -Push for SBT, PSV but unclear whether she would be an extubation candidate due to encephalopathy -VAP prevention order set -Pulmonary hygiene -Sedation as per PAD protocol, RASS goal 0.  On Precedex   Hypernatremia w/ total body volume overload.  Plan -Will need to avoid diuretics with evolving hypernatremia -Free water  added 1/10, increased on  1/11 -Follow BMP, urine output  Acute blood loss anemia; FOB +  -S/p PRBC x 4U &  FFP x2; Hgb slowly drifting down over course of hospital stay. 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 -resolved -Baseline GFR 30-44 Plan -Renal dose medications -Follow BMP, urine output  Atrial fibrillation/hx NSVT -No longer systemic AC candidate given this is at least the second life threatening bleed Plan -Continue metoprolol  50 programs twice daily -Telemetry monitoring  Chronic systolic heart failure (EF 40-45%) HTN Plan -Beta-blocker as ordered -Continue telemetry  Thrombocytopenia -stable/improved Plan -Follow intermittent CBC   Shock liver s/p arrest - improving  Plan -Follow intermittent LFT  DM2 plan -Sliding scale insulin  protocol as ordered  Advanced directives Plan DNR status -Continuing current supportive measures. -Discussions have been ongoing with patient's daughters regarding prognosis, goals for care.  Have been awaiting other family, eldest daughter from [place]  to discuss further.  It sounds like conversations have been leaning towards a comfort based focused   Best Practice (right click and Reselect all SmartList Selections daily)   Diet/type: tubefeeds DVT prophylaxis other  Pressure ulcer(s): pressure ulcer assessment deferred  GI prophylaxis: H2B Lines: N/A Foley:  Yes, and it is still needed Code Status:  full code Last date of multidisciplinary goals of care discussion [1/4 discussed with family at bedside, agree with DNR; given the findings of MRI daughters now discussing GOC  Critical care time: 32 minutes  CRITICAL CARE   Lamar Chris, MD, PhD 12/19/2023, 8:00 AM [facility] [phone] or if no answer before 7:00PM call [phone] For any issues after 7:00PM please call eLink [phone]

## 2023-12-19 NOTE — Progress Notes (Signed)
   12/19/23 0808  Vent Select  Invasive or Noninvasive Invasive  Airway 7.5 mm  Placement Date/Time: 12/12/23 1755   Placed By: ED Physician  Airway Device: Endotracheal Tube  ETT Types: Oral  Size (mm): 7.5 mm  Cuffed: Cuffed  Airway Equipment: Stylet;Bougie Stylet;Video Laryngoscope  Placement Confirmation: Direct Visualization...  Secured at (cm) 23 cm  Measured From Lips  Secured Location Right  Secured By Publishing Rights Manager  Cuff Pressure (cm H2O) Green OR 18-26 CmH2O  Adult Ventilator Settings  Vent Type Servo i  Humidity HME  Vent Mode (S)  CPAP;PSV  FiO2 (%) (S)  30 %  Pressure Support (S)  10 cmH20  PEEP (S)  5 cmH20  Adult Ventilator Measurements  Peak Airway Pressure 16 L/min  Mean Airway Pressure 9 cmH20  Resp Rate Spontaneous 28 br/min  Resp Rate Total 28 br/min  Spont TV 384 mL  Measured Ve 10.2 L  Total PEEP 5 cmH20  SpO2 100 %  Adult Ventilator Alarms  Alarms On Y  Ve High Alarm 15 L/min  Ve Low Alarm 3 L/min  Resp Rate High Alarm 32 br/min  Resp Rate Low Alarm 10  PEEP Low Alarm 3 cmH2O  Press High Alarm 38 cmH2O  T Apnea 20 sec(s)  VAP Prevention  HOB> 30 Degrees Y  Breath Sounds  Bilateral Breath Sounds Diminished

## 2023-12-20 DIAGNOSIS — I469 Cardiac arrest, cause unspecified: Secondary | ICD-10-CM | POA: Diagnosis not present

## 2023-12-20 LAB — GLUCOSE, CAPILLARY
Glucose-Capillary: 156 mg/dL — ABNORMAL HIGH (ref 70–99)
Glucose-Capillary: 162 mg/dL — ABNORMAL HIGH (ref 70–99)
Glucose-Capillary: 135 mg/dL — ABNORMAL HIGH (ref 70–99)
Glucose-Capillary: 120 mg/dL — ABNORMAL HIGH (ref 70–99)
Glucose-Capillary: 152 mg/dL — ABNORMAL HIGH (ref 70–99)
Glucose-Capillary: 150 mg/dL — ABNORMAL HIGH (ref 70–99)

## 2023-12-20 MED ORDER — AMLODIPINE BESYLATE 10 MG PO TABS
10.0000 mg | ORAL_TABLET | Freq: Every day | ORAL | Status: DC
  Administered 2023-12-20 – 2023-12-23 (×4): 10 mg
  Filled 2023-12-20 (×4): qty 1

## 2023-12-20 NOTE — Progress Notes (Signed)
 NAME:  Jasmin Miller, MRN:  [id number], DOB:  1941/08/09, LOS: 8 ADMISSION DATE:  12/12/2023, CONSULTATION DATE:  12/12/23 REFERRING MD:  Lamar Salen, MD CHIEF COMPLAINT:  Hypotension, acute blood loss anemia   History of Present Illness:  83 year old female on with pmhx below who presented for shortness of breath x 1 day.  Labs significant for Hg 4, Plt 246, iSTAT LA 13, K 6.0, CO2 7 BUN/Cr 19/1.37. Trop 133. BNP 2599.  VBG 7.06/30/50/7.5.  CXR no infiltrate effusion or edema. Influenza, RSV and covid negative.  In the ED patient became hypotensive and loss of mental status. Levophed  gtt started. Patient intubated in the ED due to airway protection due to obtunded status. 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  Progressed to PEA Cardiac arrest required CPR x 10 min (estimated) and achieved ROSC in the ED. Placed on epi gtt. Additional ER interventions: PRBC x fusion (4 Units ordered), in addition to crystalloid resuscitation. Placed on Norepi, phenylephrine  and epi gtt. Cultures sent. EDP spoke to family. No further CPR but cont supportive care. Remained hypotensive and in low perfusion state post resuscitation PCCM asked to admit   Family at bedside. Patient has shown decline over past 2 months with poor oral intake, only eating soup. She has remained active in cooking and cleaning at home on her own. Has become very weak over the past 2 months.   Pertinent  Medical History  DM2, HTN, , CHF, IDA, atrial fibrillation on Eliquis   Significant Hospital Events: Including procedures, antibiotic start and stop dates in addition to other pertinent events   1/4 Presented w/ CC SOB, had lactate 13, hgb 4, BNP 2599. D-dimer negative While in ER had loss of LOC, became hypoxic, intubated. Had cardiac arrest. ACLS provided. Time to ROSC estimated ~10  minutes. Remained in low perfusion state in spite of blood, fluid resuscitation and pressors. Made DNR in ER should arrest occur again. PCCM asked to admit.  1/5 epi off. Completed total 4 PRBC and 2 FFP. More awake.  1/6 acidosis resolved. LFTs improving. H&H stable. PLTs stable/improving. Renal fxn a little worse. Still on pressors. Still on vent. Started on fent gtt on 5th. Less responsive. Changed fent to dex. Placed left internal jugular CVL. Co-ox > 70. CVP suggesting adequate preload. Started unasyn  empirically for fever, leukocytosis and elevated PCT  1/7 weaning Norepi, weaning dex.  1/8 No acute issues overnight, low dose pressors briefly needed. Remains on low dose precedex . MRI brain 1. Scattered diffusion signal abnormality involving the cortical to subcortical aspects of both cerebral hemispheres as well as the cerebellum, consistent with evolving early subacute ischemic infarcts. These changes are largely watershed in distribution, and are consistent with a hypoperfusion injury related to recent cardiac arrest. Scattered foci of associated petechial hemorrhage without frank hemorrhagic transformation or significant regional mass Effect Underlying age-appropriate cerebral volume loss with mild chronic microvascular ischemic disease, with a small remote left PCA distribution infarct involving the left occipital lobe. 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 1/9 off pressors  1/10 no sig improvement   Interim History / Subjective:  Tachycardia and tachypnea, some witnessing any when sedation lightened on 1/11.  No significant change in mental status or ability to interact. Did about 35 minutes of PSV then back to [facility] for tachypnea I/O+ 1.6 L total Na 152 >  150, labs this morning pending WBC 7.9 > 15.0   Objective   Blood pressure (!) 92/59, pulse 72, temperature 98.5 F (36.9 C), temperature source Axillary, resp. rate (!) 22, height 5' 1  (1.549 m), weight 64.8 kg, SpO2 100%. CVP:  [0 mmHg-10 mmHg] 9 mmHg  Vent Mode: PRVC FiO2 (%):  [30 %] 30 % Set Rate:  [18 bmp] 18 bmp Vt Set:  [390 mL] 390 mL PEEP:  [5 cmH20] 5 cmH20 Pressure Support:  [10 cmH20] 10 cmH20 Plateau Pressure:  [14 cmH20-18 cmH20] 15 cmH20   Intake/Output Summary (Last 24 hours) at 12/20/2023 0716 Last data filed at 12/19/2023 1800 Gross per 24 hour  Intake 172.17 ml  Output 1230 ml  Net -1057.83 ml   Filed Weights   12/15/23 0428 12/16/23 0500 12/18/23 0500  Weight: 66.7 kg 68 kg 64.8 kg    Physical Exam: General acute on chronic ill-appearing woman laying in bed ventilated HENT ET tube in good position, some oral secretions, clear Pulm distant, clear, no wheeze Card regular, no murmur Abd positive bowel sounds, nondistended Ext trace lower extremity edema Neuro eyes are open.  Does not reliably track.  May have followed commands (closed eyes, stuck out tongue but inconsistently).  Did not move upper or lower extremities  Resolved Hospital Problem list   S/p in-hospital cardiac arrest 2/2 severe anemia and metabolic acidosis Hemorrhagic shock Lactic acidosis  Metabolic acidosis  Circulatory shock post arrest.  Hypophosphatemia  Hypotension Medication related  Assessment & Plan:   Fever w/ leukocytosis.  -Had elevated PCT. Sputum neg. 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 Prognosis for meaningful and functional recovery very poor.  Plan -Unfortunately poor prognosis here.  Plan to discuss withdrawal of care when all the family has arrived, daughter coming from [place]  -Continue neuro protective measures -Minimize sedation as able although she did have tachypnea, tachycardia, hypotension as  Precedex  was decreased on 1/11  Ventilator management  -Remains on vent s/p arrest 2/2 encephalopathy  Plan -PRVC 8 cc/kg.  Okay for PSV but she does not have the mental status to be an activation candidate at this time.  She would require tracheostomy in order to continue support -VAP prevention order set -Pulmonary hygiene -Sedation as per PAD protocol, RASS goal 0  Hypernatremia w/ total body volume overload.  Plan -Avoiding diuretics with hyponatremia -Continue current free water , await labs from this morning but slight improvement over the last 24 hours -Follow BMP and urine output  Acute blood loss anemia; FOB +  -S/p PRBC x 4U &  FFP x2; Hgb slowly drifting down over course of hospital stay. At best 8.3 over last 73 hrs down to 7 but also > 3 liters pos so could be dilutional component  Plan -DOAC and aspirin  are both on hold -Not a candidate for anticoagulation given life-threatening bleed -Transfusion candidate hemoglobin 7.0  Acute kidney injury superimposed on CKD stage 3b -resolved -Baseline GFR 30-44 Plan -Following BMP and urine output -Renal dose medications -Await labs this morning  Atrial fibrillation/hx NSVT Hypertension -No longer systemic AC candidate given this is at least the second life threatening bleed Plan -Continue metoprolol  50 mg twice daily -Add amlodipine  on 1/12 -Continue telemetry  Chronic systolic heart failure (EF 40-45%)  HTN Plan -Metoprolol  as above -Adding amlodipine  on 1/12  Thrombocytopenia -stable/improved Plan -Follow intermittent CBC   Shock liver s/p arrest - improving  Plan -Follow intermittent LFT  DM2 plan -Slight scale insulin  protocol as ordered  Advanced directives Plan DNR status -Continuing current supportive measures. -Updated patient's daughter at bedside on 1/11 and 1/12.  Await another daughter's arrival from [place]  to discuss further plans.  Based on lack of improvement family appears to be leaning  towards comfort based focus   Best Practice (right click and Reselect all SmartList Selections daily)   Diet/type: tubefeeds DVT prophylaxis other Pressure ulcer(s): pressure ulcer assessment deferred  GI prophylaxis: H2B Lines: N/A Foley:  Yes, and it is still needed Code Status:  full code Last date of multidisciplinary goals of care discussion [1/4 discussed with family at bedside, agree with DNR; given the findings of MRI daughters now discussing GOC  Critical care time: 31 minutes  CRITICAL CARE   Lamar Chris, MD, PhD 12/20/2023, 7:16 AM [facility] [phone] or if no answer before 7:00PM call [phone] For any issues after 7:00PM please call eLink [phone]

## 2023-12-20 NOTE — Plan of Care (Signed)
  Problem: Education: Goal: Ability to describe self-care measures that may prevent or decrease complications (Diabetes Survival Skills Education) will improve Outcome: Progressing   Problem: Coping: Goal: Ability to adjust to condition or change in health will improve Outcome: Progressing   Problem: Fluid Volume: Goal: Ability to maintain a balanced intake and output will improve Outcome: Progressing   Problem: Health Behavior/Discharge Planning: Goal: Ability to identify and utilize available resources and services will improve Outcome: Progressing   Problem: Nutritional: Goal: Maintenance of adequate nutrition will improve Outcome: Progressing Goal: Progress toward achieving an optimal weight will improve Outcome: Progressing

## 2023-12-21 DIAGNOSIS — I469 Cardiac arrest, cause unspecified: Secondary | ICD-10-CM | POA: Diagnosis not present

## 2023-12-21 DIAGNOSIS — R0600 Dyspnea, unspecified: Secondary | ICD-10-CM

## 2023-12-21 LAB — CBC
HCT: 22.7 % — ABNORMAL LOW (ref 36.0–46.0)
HCT: 24 % — ABNORMAL LOW (ref 36.0–46.0)
Hemoglobin: 6.3 g/dL — CL (ref 12.0–15.0)
Hemoglobin: 7 g/dL — ABNORMAL LOW (ref 12.0–15.0)
MCH: 25.3 pg — ABNORMAL LOW (ref 26.0–34.0)
MCH: 26.1 pg (ref 26.0–34.0)
MCHC: 27.8 g/dL — ABNORMAL LOW (ref 30.0–36.0)
MCHC: 29.2 g/dL — ABNORMAL LOW (ref 30.0–36.0)
MCV: 91.2 fL (ref 80.0–100.0)
MCV: 89.6 fL (ref 80.0–100.0)
Platelets: 150 10*3/uL (ref 150–400)
Platelets: 170 10*3/uL (ref 150–400)
RBC: 2.49 MIL/uL — ABNORMAL LOW (ref 3.87–5.11)
RBC: 2.68 MIL/uL — ABNORMAL LOW (ref 3.87–5.11)
RDW: 24.8 % — ABNORMAL HIGH (ref 11.5–15.5)
RDW: 25 % — ABNORMAL HIGH (ref 11.5–15.5)
WBC: 7.8 10*3/uL (ref 4.0–10.5)
WBC: 9.2 10*3/uL (ref 4.0–10.5)
nRBC: 0 % (ref 0.0–0.2)
nRBC: 0 % (ref 0.0–0.2)

## 2023-12-21 LAB — HEPATIC FUNCTION PANEL
ALT: 40 U/L (ref 0–44)
AST: 30 U/L (ref 15–41)
Albumin: 1.8 g/dL — ABNORMAL LOW (ref 3.5–5.0)
Alkaline Phosphatase: 79 U/L (ref 38–126)
Bilirubin, Direct: <0.1 mg/dL (ref 0.0–0.2)
Total Bilirubin: 0.3 mg/dL (ref 0.0–1.2)
Total Protein: 4.8 g/dL — ABNORMAL LOW (ref 6.5–8.1)

## 2023-12-21 LAB — BASIC METABOLIC PANEL
Anion gap: 5 (ref 5–15)
BUN: 43 mg/dL — ABNORMAL HIGH (ref 8–23)
CO2: 24 mmol/L (ref 22–32)
Calcium: 7.7 mg/dL — ABNORMAL LOW (ref 8.9–10.3)
Chloride: 117 mmol/L — ABNORMAL HIGH (ref 98–111)
Creatinine, Ser: 0.77 mg/dL (ref 0.44–1.00)
GFR, Estimated: >60 mL/min (ref >60)
Glucose, Bld: 162 mg/dL — ABNORMAL HIGH (ref 70–99)
Potassium: 3.8 mmol/L (ref 3.5–5.1)
Sodium: 146 mmol/L — ABNORMAL HIGH (ref 135–145)

## 2023-12-21 LAB — CBC WITH DIFFERENTIAL/PLATELET
Abs Immature Granulocytes: 0.16 10*3/uL — ABNORMAL HIGH (ref 0.00–0.07)
Basophils Absolute: 0 10*3/uL (ref 0.0–0.1)
Basophils Relative: 0 %
Eosinophils Absolute: 0.5 10*3/uL (ref 0.0–0.5)
Eosinophils Relative: 4 %
HCT: 30.7 % — ABNORMAL LOW (ref 36.0–46.0)
Hemoglobin: 9.1 g/dL — ABNORMAL LOW (ref 12.0–15.0)
Immature Granulocytes: 1 %
Lymphocytes Relative: 7 %
Lymphs Abs: 0.8 10*3/uL (ref 0.7–4.0)
MCH: 26.1 pg (ref 26.0–34.0)
MCHC: 29.6 g/dL — ABNORMAL LOW (ref 30.0–36.0)
MCV: 88 fL (ref 80.0–100.0)
Monocytes Absolute: 1 10*3/uL (ref 0.1–1.0)
Monocytes Relative: 9 %
Neutro Abs: 9.5 10*3/uL — ABNORMAL HIGH (ref 1.7–7.7)
Neutrophils Relative %: 79 %
Platelets: 213 10*3/uL (ref 150–400)
RBC: 3.49 MIL/uL — ABNORMAL LOW (ref 3.87–5.11)
RDW: 22.5 % — ABNORMAL HIGH (ref 11.5–15.5)
WBC: 12 10*3/uL — ABNORMAL HIGH (ref 4.0–10.5)
nRBC: 0 % (ref 0.0–0.2)

## 2023-12-21 LAB — PREPARE RBC (CROSSMATCH)

## 2023-12-21 LAB — GLUCOSE, CAPILLARY
Glucose-Capillary: 163 mg/dL — ABNORMAL HIGH (ref 70–99)
Glucose-Capillary: 161 mg/dL — ABNORMAL HIGH (ref 70–99)
Glucose-Capillary: 129 mg/dL — ABNORMAL HIGH (ref 70–99)
Glucose-Capillary: 107 mg/dL — ABNORMAL HIGH (ref 70–99)
Glucose-Capillary: 127 mg/dL — ABNORMAL HIGH (ref 70–99)
Glucose-Capillary: 133 mg/dL — ABNORMAL HIGH (ref 70–99)

## 2023-12-21 LAB — MAGNESIUM: Magnesium: 2.4 mg/dL (ref 1.7–2.4)

## 2023-12-21 LAB — PHOSPHORUS: Phosphorus: 3.6 mg/dL (ref 2.5–4.6)

## 2023-12-21 MED ORDER — SODIUM CHLORIDE 0.9% IV SOLUTION: Freq: Once | INTRAVENOUS | Status: AC

## 2023-12-21 MED ORDER — FREE WATER
200.0000 mL | Status: DC
  Administered 2023-12-21 – 2023-12-25 (×24): 200 mL

## 2023-12-21 NOTE — Progress Notes (Signed)
 NAME:  Jasmin Miller, MRN:  [id number], DOB:  [birth year], LOS: 9 ADMISSION DATE:  12/12/2023, CONSULTATION DATE:  12/12/2023 REFERRING MD: Garrick - EDP, CHIEF COMPLAINT:  Hypotension, acute blood loss anemia   History of Present Illness:  83 year old female on with pmhx below who presented for shortness of breath x 1 day.  Labs significant for Hg 4, Plt 246, iSTAT LA 13, K 6.0, CO2 7 BUN/Cr 19/1.37. Trop 133. BNP 2599.  VBG 7.06/30/50/7.5.  CXR no infiltrate effusion or edema. Influenza, RSV and covid negative.  In the ED patient became hypotensive and loss of mental status. Levophed  gtt started. Patient intubated in the ED due to airway protection due to obtunded status. 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  Progressed to PEA Cardiac arrest required CPR x 10 min (estimated) and achieved ROSC in the ED. Placed on epi gtt. Additional ER interventions: PRBC x fusion (4 Units ordered), in addition to crystalloid resuscitation. Placed on Norepi, phenylephrine  and epi gtt. Cultures sent. EDP spoke to family. No further CPR but cont supportive care. Remained hypotensive and in low perfusion state post resuscitation PCCM asked to admit   Family at bedside. Patient has shown decline over past 2 months with poor oral intake, only eating soup. She has remained active in cooking and cleaning at home on her own. Has become very weak over the past 2 months.   Pertinent Medical History:  DM2, HTN, CHF, IDA, atrial fibrillation on Eliquis   Significant Hospital Events: Including procedures, antibiotic start and stop dates in addition to other pertinent events   1/4 Presented w/ CC SOB, had lactate 13, Hgb 4, BNP 2599. D-dimer negative While in ER had loss of LOC, became hypoxic, intubated. Had cardiac arrest. ACLS provided. Time to ROSC estimated ~10  minutes. Remained in low perfusion state in spite of blood, fluid resuscitation and pressors. Made DNR in ER should arrest occur again. PCCM asked to admit.  1/5 Epi off. Completed total 4 PRBC and 2 FFP. More awake.  1/6 Acidosis resolved. LFTs improving. H&H stable. PLTs stable/improving. Renal fxn a little worse. Still on pressors. Still on vent. Started on fent gtt on 5th. Less responsive. Changed fent to dex. Placed left internal jugular CVL. Co-ox > 70. CVP suggesting adequate preload. Started unasyn  empirically for fever, leukocytosis and elevated PCT  1/7 Weaning Norepi, weaning dex.  1/8 No acute issues overnight, low dose pressors briefly needed. Remains on low dose precedex . MRI brain 1. Scattered diffusion signal abnormality involving the cortical to subcortical aspects of both cerebral hemispheres as well as the cerebellum, consistent with evolving early subacute ischemic infarcts. These changes are largely watershed in distribution, and are consistent with a hypoperfusion injury related to recent cardiac arrest. Scattered foci of associated petechial hemorrhage without frank hemorrhagic transformation or significant regional mass Effect Underlying age-appropriate cerebral volume loss with mild chronic microvascular ischemic disease, with a small remote left PCA distribution infarct involving the left occipital lobe. 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 1/9 off pressors  1/10 No sig improvement   Interim History / Subjective:  No significant events overnight Awake, calm on Precedex  0.3 Weaning on vent PSV 10/5, FiO2 30% since 0800 Following commands intermittently Hgb 7.0 this AM, 1U PRBCs ordered  Objective:  Blood pressure (!) 97/38, pulse 66, temperature 99 F (37.2 C), temperature source Oral,  resp. rate 18, height 5' 1 (1.549 m), weight 64.8 kg, SpO2 99%. CVP:  [8 mmHg-13 mmHg] 9 mmHg  Vent Mode: PRVC FiO2 (%):  [30 %] 30 % Set  Rate:  [18 bmp] 18 bmp Vt Set:  [390 mL] 390 mL PEEP:  [5 cmH20] 5 cmH20 Plateau Pressure:  [8 cmH20-17 cmH20] 13 cmH20   Intake/Output Summary (Last 24 hours) at 12/21/2023 0757 Last data filed at 12/21/2023 0456 Gross per 24 hour  Intake 1921.41 ml  Output 1850 ml  Net 71.41 ml   Filed Weights   12/15/23 0428 12/16/23 0500 12/18/23 0500  Weight: 66.7 kg 68 kg 64.8 kg   Physical Examination: General: Acute-on-chronically ill-appearing elderly woman in NAD. HEENT: [place]/AT, anicteric sclera, PERRL, moist mucous membranes. Neuro:  Awake, unable to assess orientation but mouthing answers to questions and nodding appropriately.  Responds to verbal stimuli. Following commands intermittently. Moves BLE spontaneously. Generalized weakness, minimal movement of BUE due to swelling/dependent edema. +Cough and +Gag  CV: RRR, no m/g/r. PULM: Breathing even and mildly labored on vent (PSV 10/5, FiO2 30%). Lung fields clear in upper fields, diminished slightly at bases R > L. GI: Soft, nontender, nondistended. Normoactive bowel sounds. Extremities: No significant LE edema noted. BUE 1-2+ pitting edema, L > R. Skin: Warm/dry, no rashes.  Resolved Hospital Problem List:  S/p in-hospital cardiac arrest 2/2 severe anemia and metabolic acidosis Hemorrhagic shock Lactic acidosis  Metabolic acidosis  Circulatory shock post arrest.  Hypophosphatemia  Hypotension Medication related Shock liver   Assessment & Plan:   Fever w/ leukocytosis Had elevated PCT. Sputum neg. WBC back up on 1/11. Stable at 9/6 1/13. 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 Prognosis for meaningful and functional recovery very poor.  - Weaning on vent and following some basic commands 1/13AM on low-dose Precedex  -  Neuroprotective measures: HOB > 30 degrees, normoglycemia, normothermia, electrolytes WNL - Additional family traveling in from [place]; needs repeat GOC discussion with family present pending further progress  Ventilator management in the setting of post-arrest encephalopathy - Continue full vent support (4-8cc/kg IBW) - Wean FiO2 for O2 sat > 90% - Daily WUA/SBT as mental status permits, tolerating well 1/13AM - VAP bundle - Pulmonary hygiene - PAD protocol for sedation: Precedex  for goal RASS 0 to -1 - Further GOC discussion, will likely recommend one-way extubation and recommend against reintubation if failing  Hypernatremia w/ total body volume overload 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 - Trend BMP, specifically Na - FWF reduced to 200mg  Q4H in the setting of Na normalization/volume overload - Replete electrolytes as indicated - Monitor I&Os - Avoid nephrotoxic agents as able - Ensure adequate renal perfusion  Acute blood loss anemia; FOBT +  Thrombocytopenia S/p PRBC x 4U &  FFP x2; Hgb slowly drifting down over course of hospital stay. At best 8.3 over last 73 hrs down to 7 but also > 3 liters pos so could be dilutional component  - Trend H&H, Plt - Monitor for signs of active bleeding - Transfuse for Hgb < 7.0 or hemodynamically significant bleeding - 1U PRBCs ordered for today, 1/13 - Continue to hold DOAC/ASA  Chronic systolic heart failure (EF 40-45%) HTN Atrial fibrillation History NSVT Hypertension Echo 12/2023 with EF 55-60%, normal LV  function, no RWMAs, reduced RV systolic function, degenerative MV. No longer systemic AC candidate given this is at least the second life-threatening bleed. - Continue amlodipine , metoprolol  50mg  BID - Cardiac monitoring - Optimize electrolytes for K > 4, Mg > 2 - Not a candidate for further for AC  DM2 - SSI - CBGs Q4H - Goal CBG 140-180  Advanced directives - DNR code status with interventions desired -  Ongoing GOC discussions pending trajectory of recovery - Patient's daughters updated; awaiting daughter from [place] for further decisionmaking - PMT consult if family desires  Best Practice (right click and Reselect all SmartList Selections daily)   Diet/type: tubefeeds DVT prophylaxis other Pressure ulcer(s): pressure ulcer assessment deferred  GI prophylaxis: H2B Lines: N/A Foley:  Yes, and it is still needed Code Status:  full code Last date of multidisciplinary goals of care discussion [1/4 discussed with family at bedside, agree with DNR; given the findings of MRI daughters now discussing GOC   Critical care time:   The patient is critically ill with multiple organ system failure and requires high complexity decision making for assessment and support, frequent evaluation and titration of therapies, advanced monitoring, review of radiographic studies and interpretation of complex data.   Critical Care Time devoted to patient care services, exclusive of separately billable procedures, described in this note is 38 minutes.  Corean CHRISTELLA Christon Parada, PA-C [facility] 12/21/23 8:10 AM  Please see [email or web address] for pager details.  From 7A-7P if no response, please call [phone] After hours, please call ELink [phone]

## 2023-12-21 NOTE — TOC Progression Note (Addendum)
 Transition of Care [facility]) - Progression Note    Patient Details  Name: Jasmin Miller MRN: [id number] Date of Birth: 02-02-1941  Transition of [facility]) CM/SW Contact  Dalila Camellia [place], [place] Phone Number: 12/21/2023, 11:48 AM  Clinical Narrative:     TOC to continue to follow patient's progress throughout discharge planning.  Patient may be switching to comfort care.  TOC to continue to follow.   Expected Discharge Plan:  (TBD) Barriers to Discharge: Continued Medical Work up  Expected Discharge Plan and Services   Discharge Planning Services: CM Consult   Living arrangements for the past 2 months: Single Family Home                                       Social Determinants of Health (SDOH) Interventions SDOH Screenings   Food Insecurity: No Food Insecurity (12/18/2023)  Housing: Low Risk  (12/18/2023)  Transportation Needs: No Transportation Needs (12/18/2023)  Utilities: Not At Risk (12/18/2023)  Social Connections: Patient Unable To Answer (12/12/2023)  Tobacco Use: Medium Risk (08/07/2023)    Readmission Risk Interventions    12/18/2023    2:44 PM  Readmission Risk Prevention Plan  Transportation Screening Complete  PCP or Specialist Appt within 5-7 Days Complete  Home Care Screening Complete  Medication Review (RN CM) Complete

## 2023-12-21 NOTE — Plan of Care (Signed)

## 2023-12-22 DIAGNOSIS — I469 Cardiac arrest, cause unspecified: Secondary | ICD-10-CM | POA: Diagnosis not present

## 2023-12-22 LAB — TYPE AND SCREEN
ABO/RH(D): B POS
Antibody Screen: POSITIVE
Donor AG Type: NEGATIVE
Unit division: 0

## 2023-12-22 LAB — BPAM RBC
Blood Product Expiration Date: 202502032359
ISSUE DATE / TIME: 202501131610
Unit Type and Rh: 1700

## 2023-12-22 LAB — BASIC METABOLIC PANEL
Anion gap: 5 (ref 5–15)
BUN: 41 mg/dL — ABNORMAL HIGH (ref 8–23)
CO2: 23 mmol/L (ref 22–32)
Calcium: 7.7 mg/dL — ABNORMAL LOW (ref 8.9–10.3)
Chloride: 116 mmol/L — ABNORMAL HIGH (ref 98–111)
Creatinine, Ser: 0.72 mg/dL (ref 0.44–1.00)
GFR, Estimated: >60 mL/min (ref >60)
Glucose, Bld: 162 mg/dL — ABNORMAL HIGH (ref 70–99)
Potassium: 3.7 mmol/L (ref 3.5–5.1)
Sodium: 144 mmol/L (ref 135–145)

## 2023-12-22 LAB — CBC
HCT: 27.6 % — ABNORMAL LOW (ref 36.0–46.0)
Hemoglobin: 8.1 g/dL — ABNORMAL LOW (ref 12.0–15.0)
MCH: 26.1 pg (ref 26.0–34.0)
MCHC: 29.3 g/dL — ABNORMAL LOW (ref 30.0–36.0)
MCV: 89 fL (ref 80.0–100.0)
Platelets: 204 10*3/uL (ref 150–400)
RBC: 3.1 MIL/uL — ABNORMAL LOW (ref 3.87–5.11)
RDW: 22.9 % — ABNORMAL HIGH (ref 11.5–15.5)
WBC: 9.2 10*3/uL (ref 4.0–10.5)
nRBC: 0 % (ref 0.0–0.2)

## 2023-12-22 LAB — GLUCOSE, CAPILLARY
Glucose-Capillary: 117 mg/dL — ABNORMAL HIGH (ref 70–99)
Glucose-Capillary: 132 mg/dL — ABNORMAL HIGH (ref 70–99)
Glucose-Capillary: 140 mg/dL — ABNORMAL HIGH (ref 70–99)
Glucose-Capillary: 142 mg/dL — ABNORMAL HIGH (ref 70–99)
Glucose-Capillary: 143 mg/dL — ABNORMAL HIGH (ref 70–99)
Glucose-Capillary: 149 mg/dL — ABNORMAL HIGH (ref 70–99)

## 2023-12-22 LAB — PHOSPHORUS: Phosphorus: 3.4 mg/dL (ref 2.5–4.6)

## 2023-12-22 LAB — MAGNESIUM: Magnesium: 2.4 mg/dL (ref 1.7–2.4)

## 2023-12-22 NOTE — Progress Notes (Signed)
 Nutrition Follow-up  DOCUMENTATION CODES:   Non-severe (moderate) malnutrition in context of chronic illness  INTERVENTION:  - Continue current TF regimen: Vital 1.2 at 55 ml/h (1320 ml per day) Provides 1584 kcal, 99 gm protein, 1070 ml free water  daily   - FWF per CCM/MD. Currently ordered 200mL Q4H FWF which in addition to TF is providing 2256mL/day.   - Monitor weight trends.    NUTRITION DIAGNOSIS:   Moderate Malnutrition related to chronic illness as evidenced by mild fat depletion, mild muscle depletion. *ongoing  GOAL:   Patient will meet greater than or equal to 90% of their needs *met with TF  MONITOR:   Vent status, Labs, Weight trends, TF tolerance  REASON FOR ASSESSMENT:   Consult (Trickle feeds) Enteral/tube feeding initiation and management  ASSESSMENT:   83 year old female with PMH DM2 and HTN who presented for shortness of breath and admitted for cardiac arrest.  1/4 Admit; Intubated 1/5: Pivot started at 27mL/hr 1/6: Switched to Vital 1.2 1/8 MRI brain showing numerous watershed infarcts due to hypoperfusion  Patient is currently intubated on ventilator support MV: 9.7 L/min Temp (24hrs), Avg:99.1 F (37.3 C), Min:97.6 F (36.4 C), Max:100.2 F (37.9 C)  Patient continues to tolerate tube feeds well.   Patient noted to have started following commands yesterday. Was tolerating SBT trials yesterday.    Admit weight: 136# Current weight: 151# I&O's: +2.8L + for mild pitting generalized edema and RUE/LUE and RLE/LLE edema.  Medications reviewed and include: Colace, Miralax , 200mL Q4H FWF  Labs reviewed:  -   Diet Order:   Diet Order             Diet NPO time specified  Diet effective now                   EDUCATION NEEDS:  Education needs have been addressed  Skin:  Skin Assessment: Reviewed RN Assessment  Last BM:  1/13 - type 7  Height:  Ht Readings from Last 1 Encounters:  12/14/23 5' 1 (1.549 m)   Weight:   Wt Readings from Last 1 Encounters:  12/22/23 68.8 kg    BMI:  Body mass index is 28.66 kg/m.  Estimated Nutritional Needs:  Kcal:  1550-1750 kcals Protein:  85-100 grams Fluid:  >/= 1.5L    Trude Ned RD, LDN Contact via Secure Chat.

## 2023-12-22 NOTE — Progress Notes (Signed)
 eLink Physician-Brief Progress Note Patient Name: Jasmin Miller DOB: [birth year] MRN: [id number]   Date of Service  12/22/2023  HPI/Events of Note  Patient MAP 58, BP 109/32 Asked whether it would be appropriate to give scheduled metoprolol   eICU Interventions  Proceed with antihypertensives as ordered.  Add holding parameters.     Intervention Category Minor Interventions: Routine modifications to care plan (e.g. PRN medications for pain, fever)  Shaira Sova 12/22/2023, 9:47 PM

## 2023-12-22 NOTE — Progress Notes (Signed)
 eLink Physician-Brief Progress Note Patient Name: Jasmin Miller DOB: Aug 02, 1941 MRN: [id number]   Date of Service  12/22/2023  HPI/Events of Note  Bedside RN reaching out because MAP is low, however, BP is 111/27 (54). She says it dropped d/t multiple fentanyl  boluses and increases in Dex to get the patient to settle since coming on shift. She also tends to run HTN and received a dose of Hydralazine  at 1751, and metoprolol  50mg  at 2138.   eICU Interventions  No indication for pressors at this time.  Maintain SBP greater than 100.  No other interventions at this time.     Intervention Category Intermediate Interventions: Hypotension - evaluation and management  Jasmin Miller 12/22/2023, 2:21 AM

## 2023-12-22 NOTE — Plan of Care (Signed)
  Problem: Fluid Volume: Goal: Ability to maintain a balanced intake and output will improve Outcome: Progressing   Problem: Clinical Measurements: Goal: Ability to maintain clinical measurements within normal limits will improve Outcome: Progressing Goal: Diagnostic test results will improve Outcome: Progressing

## 2023-12-22 NOTE — Progress Notes (Signed)
 NAME:  Alli Jasmer, MRN:  [id number], DOB:  07-16-1941, LOS: 10 ADMISSION DATE:  12/12/2023, CONSULTATION DATE:  12/12/2023 REFERRING MD: Garrick - EDP, CHIEF COMPLAINT:  Hypotension, acute blood loss anemia   History of Present Illness:  83 year old female on with PMHx below who presented for SOB x 1 day.  Labs significant for Hg 4, Plt 246, iSTAT LA 13, K 6.0, CO2 7 BUN/Cr 19/1.37. Trop 133. BNP 2599.  VBG 7.06/30/50/7.5.  CXR no infiltrate effusion or edema. Influenza, RSV and COVID negative.  In the ED patient became hypotensive and loss of mental status. Levophed  gtt started. Patient intubated in the ED due to airway protection due to obtunded status. 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  Progressed to PEA Cardiac arrest required CPR x 10 min (estimated) and achieved ROSC in the ED. Placed on epi gtt. Additional ER interventions: PRBC x fusion (4 Units ordered), in addition to crystalloid resuscitation. Placed on Norepi, phenylephrine  and epi gtt. Cultures sent. EDP spoke to family. No further CPR but cont supportive care. Remained hypotensive and in low perfusion state post resuscitation PCCM asked to admit   Family at bedside. Patient has shown decline over past 2 months with poor oral intake, only eating soup. She has remained active in cooking and cleaning at home on her own. Has become very weak over the past 2 months.   Pertinent Medical History:  DM2, HTN, CHF, IDA, atrial fibrillation on Eliquis   Significant Hospital Events: Including procedures, antibiotic start and stop dates in addition to other pertinent events   1/4 Presented w/ CC SOB, had lactate 13, Hgb 4, BNP 2599. D-dimer negative While in ER had loss of LOC, became hypoxic, intubated. Had cardiac arrest. ACLS provided. Time to ROSC estimated ~10 minutes. Remained in  low perfusion state in spite of blood, fluid resuscitation and pressors. Made DNR in ER should arrest occur again. PCCM asked to admit.  1/5 Epi off. Completed total 4 PRBC and 2 FFP. More awake.  1/6 Acidosis resolved. LFTs improving. H&H stable. PLTs stable/improving. Renal fxn a little worse. Still on pressors. Still on vent. Started on fent gtt on 5th. Less responsive. Changed fent to dex. Placed left internal jugular CVL. Co-ox > 70. CVP suggesting adequate preload. Started unasyn  empirically for fever, leukocytosis and elevated PCT  1/7 Weaning Norepi, weaning dex. 1/8 No acute issues overnight, low dose pressors briefly needed. Remains on low dose Precedex . MRI brain 1. Scattered diffusion signal abnormality involving the cortical to subcortical aspects of both cerebral hemispheres as well as the cerebellum, consistent with evolving early subacute ischemic infarcts. These changes are largely watershed in distribution, and are consistent with a hypoperfusion injury related to recent cardiac arrest. Scattered foci of associated petechial hemorrhage without frank hemorrhagic transformation or significant regional mass effect; underlying age-appropriate cerebral volume loss with mild chronic microvascular ischemic disease, with a small remote left PCA distribution infarct involving the left occipital lobe. 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 1/9 Off pressors  1/10 No sig improvement   Interim History / Subjective:  No significant events overnight BP slightly labile, hypertensive 1/13 then low MAPs with maintained SBP Has not required pressors Weaned on vent for several hours yesterday, on PSV 5/5 this AM tolerating well Following commands intermittently Ongoing GOC discussions with family, will defer for today as today is patient's birthday  Objective:  Blood pressure (!) 171/57, pulse 88, temperature 99.4 F (37.4 C), temperature source Axillary, resp. rate  20, height 5' 1 (1.549 m), weight 68.8 kg, SpO2 99%. CVP:  [2 mmHg-17 mmHg] 12 mmHg  Vent Mode: PRVC FiO2 (%):  [30 %] 30 % Set Rate:  [18 bmp] 18 bmp Vt Set:  [390 mL] 390 mL PEEP:  [5 cmH20] 5 cmH20 Pressure Support:  [10 cmH20] 10 cmH20 Plateau Pressure:  [14 cmH20-15 cmH20] 15 cmH20   Intake/Output Summary (Last 24 hours) at 12/22/2023 0715 Last data filed at 12/22/2023 0400 Gross per 24 hour  Intake 1678.5 ml  Output 1350 ml  Net 328.5 ml   Filed Weights   12/16/23 0500 12/18/23 0500 12/22/23 0403  Weight: 68 kg 64.8 kg 68.8 kg   Physical Examination: General: Acutely ill-appearing elderly woman in NAD. Intubated, sedated. HEENT: [place]/AT, anicteric sclera, PERRL 3mm, moist mucous membranes. Neuro:  Lightly sedated, intubated.  Responds to verbal stimuli. Following commands intermittently. Spontaneous movement of BLE L > R. No spontaneous movement of BUE.+Corneal, +Cough, and +Gag  CV: RRR, no m/g/r. PULM: Breathing even and unlabored on vent (PSV 5/5 , FiO2 30%). Lung fields diminished at bases bilaterally. GI: Soft, nontender, nondistended. Hypoactive bowel sounds. Extremities: Trace BLE edema noted, dependent pitting 1-2+ edema of BUE. Skin: Warm/dry, no rashes.  Resolved Hospital Problem List:  S/p in-hospital cardiac arrest 2/2 severe anemia and metabolic acidosis Hemorrhagic shock Lactic acidosis  Metabolic acidosis  Circulatory shock post arrest.  Hypophosphatemia  Hypotension Medication related Shock liver   Assessment & Plan:   Fever w/ leukocytosis Had elevated PCT. Sputum neg. WBC back up on 1/11. Stable at 9/6 1/13. 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 Prognosis for meaningful and functional recovery very poor.  - Weaning on vent/tolerating SBT,  intermittently following basic commands on Precedex  0.5. - Unclear how patient would tolerate extubation, but working toward this - Neuroprotective measures: HOB > 30 degrees, normoglycemia, normothermia, electrolytes WNL - Ongoing GOC discussions; these have been suspended as today is patient's birthday, will resume once family has had more time to discuss  Ventilator management in the setting of post-arrest encephalopathy - Continue full vent support (4-8cc/kg IBW) - Wean FiO2 for O2 sat > 90% - Daily WUA/SBT as mental status permits, tolerating well 1/14AM - VAP bundle - Pulmonary hygiene - PAD protocol for sedation: Precedex  for goal RASS 0 to -1 - Ongoing GOC discussion, likely recommendation for one-way extubation and no reintubation if worsening; would then recommend shifting to comfort-focused care  Hypernatremia w/ total body volume overload 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 - Trend BMP - Replete electrolytes as indicated - FWF 200mg  Q4H, can likely reduce further today - Monitor I&Os - Avoid nephrotoxic agents as able - Ensure adequate renal perfusion  Acute blood loss anemia; FOBT +  Thrombocytopenia S/p PRBC x 4U &  FFP x2; Hgb slowly drifting down over course of hospital stay. At best 8.3 over last 73 hrs down to 7 but also > 3 liters pos so could be dilutional component. S/p additional unit PRBCs 1/13. - Trend H&H, Plt - Monitor for signs of active bleeding - Transfuse for Hgb < 7.0, Plt < 20K or hemodynamically significant bleeding - Continue to hold  DOAC/ASA  Chronic systolic heart failure (EF 40-45%) HTN Atrial fibrillation History NSVT Hypertension Echo 12/2023 with EF 55-60%, normal LV function, no RWMAs, reduced RV systolic function, degenerative MV. No longer systemic AC candidate given this is at least the second life-threatening bleed. - Continue Norvasc , metoprolol  50mg  BID - Cardiac monitoring - Optimize electrolytes for K > 4,  Mg > 2 - Not a candidate for [facility] given life-threatening bleed  T2DM - SSI - CBGs Q4H - Goal CBG 140-180  Advanced directives - DNR code status with interventions desired - Ongoing GOC discussions pending recovery trajectory; family is all present at this point - Family updated at bedside 1/13 - PMT consult if desired by family  Best Practice: (right click and Reselect all SmartList Selections daily)   Diet/type: tubefeeds DVT prophylaxis other Pressure ulcer(s): pressure ulcer assessment deferred  GI prophylaxis: H2B Lines: N/A Foley:  Yes, and it is still needed Code Status:  full code Last date of multidisciplinary goals of care discussion [1/4 discussed with family at bedside, agree with DNR; given the findings of MRI daughters now discussing GOC   Critical care time:   The patient is critically ill with multiple organ system failure and requires high complexity decision making for assessment and support, frequent evaluation and titration of therapies, advanced monitoring, review of radiographic studies and interpretation of complex data.   Critical Care Time devoted to patient care services, exclusive of separately billable procedures, described in this note is 35 minutes.  Corean CHRISTELLA Prince [place], PA-C [facility] 12/22/23 7:15 AM  Please see [email or web address] for pager details.  From 7A-7P if no response, please call [phone] After hours, please call ELink [phone]

## 2023-12-23 DIAGNOSIS — G931 Anoxic brain damage, not elsewhere classified: Secondary | ICD-10-CM

## 2023-12-23 DIAGNOSIS — D62 Acute posthemorrhagic anemia: Secondary | ICD-10-CM | POA: Diagnosis not present

## 2023-12-23 DIAGNOSIS — J9601 Acute respiratory failure with hypoxia: Secondary | ICD-10-CM

## 2023-12-23 DIAGNOSIS — I469 Cardiac arrest, cause unspecified: Secondary | ICD-10-CM | POA: Diagnosis not present

## 2023-12-23 DIAGNOSIS — R578 Other shock: Secondary | ICD-10-CM

## 2023-12-23 LAB — BASIC METABOLIC PANEL
Anion gap: 7 (ref 5–15)
BUN: 39 mg/dL — ABNORMAL HIGH (ref 8–23)
CO2: 21 mmol/L — ABNORMAL LOW (ref 22–32)
Calcium: 7.9 mg/dL — ABNORMAL LOW (ref 8.9–10.3)
Chloride: 117 mmol/L — ABNORMAL HIGH (ref 98–111)
Creatinine, Ser: 0.68 mg/dL (ref 0.44–1.00)
GFR, Estimated: >60 mL/min (ref >60)
Glucose, Bld: 170 mg/dL — ABNORMAL HIGH (ref 70–99)
Potassium: 3.8 mmol/L (ref 3.5–5.1)
Sodium: 145 mmol/L (ref 135–145)

## 2023-12-23 LAB — CBC
HCT: 27.6 % — ABNORMAL LOW (ref 36.0–46.0)
Hemoglobin: 8.1 g/dL — ABNORMAL LOW (ref 12.0–15.0)
MCH: 26.6 pg (ref 26.0–34.0)
MCHC: 29.3 g/dL — ABNORMAL LOW (ref 30.0–36.0)
MCV: 90.8 fL (ref 80.0–100.0)
Platelets: 243 10*3/uL (ref 150–400)
RBC: 3.04 MIL/uL — ABNORMAL LOW (ref 3.87–5.11)
RDW: 23.1 % — ABNORMAL HIGH (ref 11.5–15.5)
WBC: 9.8 10*3/uL (ref 4.0–10.5)
nRBC: 0 % (ref 0.0–0.2)

## 2023-12-23 LAB — GLUCOSE, CAPILLARY
Glucose-Capillary: 133 mg/dL — ABNORMAL HIGH (ref 70–99)
Glucose-Capillary: 127 mg/dL — ABNORMAL HIGH (ref 70–99)
Glucose-Capillary: 147 mg/dL — ABNORMAL HIGH (ref 70–99)
Glucose-Capillary: 114 mg/dL — ABNORMAL HIGH (ref 70–99)
Glucose-Capillary: 139 mg/dL — ABNORMAL HIGH (ref 70–99)
Glucose-Capillary: 130 mg/dL — ABNORMAL HIGH (ref 70–99)

## 2023-12-23 LAB — MAGNESIUM: Magnesium: 2.4 mg/dL (ref 1.7–2.4)

## 2023-12-23 LAB — PHOSPHORUS: Phosphorus: 3.4 mg/dL (ref 2.5–4.6)

## 2023-12-23 MED ORDER — OXYCODONE HCL 5 MG PO TABS
5.0000 mg | ORAL_TABLET | Freq: Four times a day (QID) | ORAL | Status: DC
  Administered 2023-12-24 – 2023-12-25 (×5): 5 mg
  Filled 2023-12-23 (×5): qty 1

## 2023-12-23 MED ORDER — OXYCODONE HCL 5 MG PO TABS
5.0000 mg | ORAL_TABLET | Freq: Four times a day (QID) | ORAL | Status: DC | PRN
  Administered 2023-12-23: 5 mg
  Filled 2023-12-23: qty 1

## 2023-12-23 NOTE — Progress Notes (Signed)
   12/23/23 1008  Adult Ventilator Settings  Vent Mode PSV;CPAP  FiO2 (%) 30 %  Pressure Support (S)  8 cmH20 (Weaned to PSV 8 by provider.)  PEEP 5 cmH20  Adult Ventilator Measurements  SpO2 100 %

## 2023-12-23 NOTE — Progress Notes (Addendum)
   12/23/23 1036  Adult Ventilator Settings  Vent Mode (S)  PRVC (Changed back to full support by RN due to consistently high RR 38. Pt weaned 2 hrs. 45 mins.)  Adult Ventilator Measurements  SpO2 100 %   PSV 10/5 to 8/5.

## 2023-12-23 NOTE — Progress Notes (Signed)
   12/23/23 0748  Vent Select  Invasive or Noninvasive Invasive  Adult Vent Y  Airway 7.5 mm  Placement Date/Time: 12/12/23 1755   Placed By: ED Physician  Airway Device: Endotracheal Tube  ETT Types: Oral  Size (mm): 7.5 mm  Cuffed: Cuffed  Airway Equipment: Stylet;Bougie Stylet;Video Laryngoscope  Placement Confirmation: Direct Visualization...  Secured at (cm) 23 cm  Measured From Lips  Secured Location Center  Secured By English as a second language teacher No  Tube Holder Repositioned Yes  Prone position No  Head position Left  Cuff Pressure (cm H2O) Green OR 18-26 CmH2O  Site Condition Drainage (Comment)  Adult Ventilator Settings  Vent Type Servo i  Humidity HME  Vent Mode (S)  PSV;CPAP  FiO2 (%) (S)  30 %  Pressure Support (S)  10 cmH20  PEEP (S)  5 cmH20  Adult Ventilator Measurements  Peak Airway Pressure 16 L/min  Mean Airway Pressure 8 cmH20  Resp Rate Spontaneous 25 br/min  Resp Rate Total 25 br/min  Spont TV 481 mL  Measured Ve 13.4 L  Total PEEP 5 cmH20  SpO2 100 %  Adult Ventilator Alarms  Alarms On Y  Ve High Alarm 23 L/min  Ve Low Alarm 4 L/min  Resp Rate High Alarm 38 br/min  Resp Rate Low Alarm 12  PEEP Low Alarm 3 cmH2O  Press High Alarm 40 cmH2O  T Apnea 20 sec(s)  VAP Prevention  HOB> 30 Degrees Y  Breath Sounds  Bilateral Breath Sounds Rhonchi  Vent Respiratory Assessment  Level of Consciousness Responds to Voice  Respiratory Pattern Regular;Unlabored  Patient Tolerance Tolerated well  Suction Method  Respiratory Interventions Oral suction;Airway suction  Oral Suctioning/Secretions  Suction Type Oral  Suction Device Yankauer  Secretion Amount Small  Secretion Color White  Secretion Consistency Thin  Suction Tolerance Tolerated well  Suctioning Adverse Effects None  Airway Suctioning/Secretions  Suction Type ETT  Suction Device  Catheter  Secretion Amount Small  Secretion Color White  Secretion Consistency Thin;Thick  Suction  Tolerance Tolerated well  Suctioning Adverse Effects None   Wean as tolerated.

## 2023-12-23 NOTE — Progress Notes (Signed)
 NAME:  Jasmin Miller, MRN:  [id number], DOB:  1941-10-30, LOS: 11 ADMISSION DATE:  12/12/2023, CONSULTATION DATE:  12/12/2023 REFERRING MD: Liam Redhead - EDP, CHIEF COMPLAINT:  Hypotension, acute blood loss anemia   History of Present Illness:  83 year old female on with PMHx below who presented for SOB x 1 day.  Labs significant for Hg 4, Plt 246, iSTAT LA 13, K 6.0, CO2 7 BUN/Cr 19/1.37. Trop 133. BNP 2599.  VBG 7.06/30/50/7.5.  CXR no infiltrate effusion or edema. Influenza, RSV and COVID negative.  In the ED patient became hypotensive and loss of mental status. Levophed  gtt started. Patient intubated in the ED due to airway protection due to obtunded status. 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  Progressed to PEA Cardiac arrest required CPR x 10 min (estimated) and achieved ROSC in the ED. Placed on epi gtt. Additional ER interventions: PRBC x fusion (4 Units ordered), in addition to crystalloid resuscitation. Placed on Norepi, phenylephrine  and epi gtt. Cultures sent. EDP spoke to family. No further CPR but cont supportive care. Remained hypotensive and in low perfusion state post resuscitation PCCM asked to admit   Family at bedside. Patient has shown decline over past 2 months with poor oral intake, only eating soup. She has remained active in cooking and cleaning at home on her own. Has become very weak over the past 2 months.   Pertinent Medical History:  DM2, HTN, CHF, IDA, atrial fibrillation on Eliquis   Significant Hospital Events: Including procedures, antibiotic start and stop dates in addition to other pertinent events   1/4 Presented w/ CC SOB, had lactate 13, Hgb 4, BNP 2599. D-dimer negative While in ER had loss of LOC, became hypoxic, intubated. Had cardiac arrest. ACLS provided. Time to ROSC estimated ~10 minutes. Remained in  low perfusion state in spite of blood, fluid resuscitation and pressors. Made DNR in ER should arrest occur again. PCCM asked to admit.  1/5 Epi off. Completed total 4 PRBC and 2 FFP. More awake.  1/6 Acidosis resolved. LFTs improving. H&H stable. PLTs stable/improving. Renal fxn a little worse. Still on pressors. Still on vent. Started on fent gtt on 5th. Less responsive. Changed fent to dex. Placed left internal jugular CVL. Co-ox > 70. CVP suggesting adequate preload. Started unasyn  empirically for fever, leukocytosis and elevated PCT  1/7 Weaning Norepi, weaning dex. 1/8 No acute issues overnight, low dose pressors briefly needed. Remains on low dose Precedex . MRI brain 1. Scattered diffusion signal abnormality involving the cortical to subcortical aspects of both cerebral hemispheres as well as the cerebellum, consistent with evolving early subacute ischemic infarcts. These changes are largely watershed in distribution, and are consistent with a hypoperfusion injury related to recent cardiac arrest. Scattered foci of associated petechial hemorrhage without frank hemorrhagic transformation or significant regional mass effect; underlying age-appropriate cerebral volume loss with mild chronic microvascular ischemic disease, with a small remote left PCA distribution infarct involving the left occipital lobe. 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 1/9 Off pressors 1/10 No sig improvement 1/14 Following some commands and tolerating SBT  Interim History / Subjective:  No acute events overnight HD stable Weaned 12 hours yesterday K 3.8   Objective:  Blood pressure (!) 125/39, pulse (!) 56, temperature 98.7 F (37.1 C), temperature source Axillary, resp. rate (!) 24, height 5\' 1"  (1.549 m), weight 68.9 kg, SpO2 99%.  CVP:  [6 mmHg-10 mmHg] 7 mmHg  Vent Mode: PRVC FiO2 (%):  [30 %] 30 % Set Rate:  [18 bmp] 18 bmp Vt Set:  [390 mL] 390 mL PEEP:  [5 cmH20] 5  cmH20 Pressure Support:  [5 cmH20-10 cmH20] 10 cmH20 Plateau Pressure:  [15 cmH20] 15 cmH20   Intake/Output Summary (Last 24 hours) at 12/23/2023 0718 Last data filed at 12/23/2023 0429 Gross per 24 hour  Intake 1529.34 ml  Output 1125 ml  Net 404.34 ml   Filed Weights   12/18/23 0500 12/22/23 0403 12/23/23 0433  Weight: 64.8 kg 68.8 kg 68.9 kg   Physical Examination: General:  elderly female on vent.  Neuro:  Sedated, does follow some simple commands and mouths words over ETT. No eye opening.  HEENT:  [place]/AT, PERRL, no JVD Cardiovascular:  RRR, no MRG Lungs:  Clear bilateral breath sounds Abdomen:  Soft, non-distended, non-tender.  Musculoskeletal:  No acute deformity Skin:  Intact, MMM  Resolved Hospital Problem List:  S/p in-hospital cardiac arrest 2/2 severe anemia and metabolic acidosis Hemorrhagic shock Lactic acidosis  Metabolic acidosis  Circulatory shock post arrest.  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 Prognosis for meaningful and functional recovery very poor.  - Vent support for airway protection - Precedex  for ETT related pain and agitation. RASS goal 0 to -1 - Neuroprotective measures: HOB > 30 degrees, normoglycemia, normothermia, electrolytes WNL - Ongoing GOC discussions; these have been suspended as today is patient's birthday, will resume once family has had more time to discuss  Ventilator management in the setting of post-arrest encephalopathy - Full vent support - Wean FiO2 for O2 sat > 90% - SBT daily - VAP bundle - PAD protocol for sedation: Precedex  for goal RASS 0 to -1 - Ongoing GOC discussion, likely recommendation for one-way extubation and no reintubation if worsening; would then recommend shifting to comfort-focused care  Fever w/  leukocytosis Had elevated PCT. Sputum neg. WBC back up on 1/11. Stable at 9/6 1/13. 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 At best 8.3 over last 73 hrs down to 7 but also > 3 liters pos so could be dilutional component. S/p additional unit PRBCs 1/13. - Monitor for signs of active bleeding - Transfuse for Hgb < 7.0, Plt < 20K or hemodynamically significant bleeding - Continue to hold DOAC/ASA  Chronic systolic heart failure (EF 40-45%) HTN Atrial fibrillation History NSVT Hypertension Echo 12/2023 with EF 55-60%, normal LV function, no RWMAs, reduced RV systolic function, degenerative MV. No longer systemic AC candidate given this is at least the second life-threatening bleed. - Continue Norvasc , metoprolol  50mg  BID - Cardiac monitoring - Optimize electrolytes for K > 4, Mg > 2 - Not a candidate for [facility] given life-threatening bleed  T2DM - SSI - CBGs Q4H - Goal CBG 140-180  Advanced directives - DNR code status with interventions desired - Ongoing GOC discussions pending recovery trajectory; family is all present at this point - Will plan to  discuss further today once family arrives.  - PMT consult if desired by family  Best Practice: (right click and "Reselect all SmartList Selections" daily)   Diet/type: tubefeeds DVT prophylaxis other Pressure ulcer(s): pressure ulcer assessment deferred  GI prophylaxis: H2B Lines: N/A Foley:  Yes, and it is still needed Code Status:  full code Last date of multidisciplinary goals of care discussion [1/4 discussed with family at bedside, agree with DNR; given the findings of MRI daughters now discussing GOC   Critical care time:   38  minutes  Roz Cornelia, AGACNP-BC [facility]  See Amion for personal pager PCCM on call pager [phone] until 7pm. Please call Elink 7p-7a. [phone]  12/23/2023 7:42 AM

## 2023-12-24 ENCOUNTER — Inpatient Hospital Stay (HOSPITAL_COMMUNITY): Payer: 59

## 2023-12-24 DIAGNOSIS — D62 Acute posthemorrhagic anemia: Secondary | ICD-10-CM | POA: Diagnosis not present

## 2023-12-24 DIAGNOSIS — I469 Cardiac arrest, cause unspecified: Secondary | ICD-10-CM | POA: Diagnosis not present

## 2023-12-24 DIAGNOSIS — J9601 Acute respiratory failure with hypoxia: Secondary | ICD-10-CM | POA: Diagnosis not present

## 2023-12-24 DIAGNOSIS — R578 Other shock: Secondary | ICD-10-CM | POA: Diagnosis not present

## 2023-12-24 LAB — BASIC METABOLIC PANEL
Anion gap: 6 (ref 5–15)
BUN: 42 mg/dL — ABNORMAL HIGH (ref 8–23)
CO2: 21 mmol/L — ABNORMAL LOW (ref 22–32)
Calcium: 7.6 mg/dL — ABNORMAL LOW (ref 8.9–10.3)
Chloride: 112 mmol/L — ABNORMAL HIGH (ref 98–111)
Creatinine, Ser: 0.68 mg/dL (ref 0.44–1.00)
GFR, Estimated: >60 mL/min (ref >60)
Glucose, Bld: 148 mg/dL — ABNORMAL HIGH (ref 70–99)
Potassium: 3.8 mmol/L (ref 3.5–5.1)
Sodium: 139 mmol/L (ref 135–145)

## 2023-12-24 LAB — CBC
HCT: 26.2 % — ABNORMAL LOW (ref 36.0–46.0)
Hemoglobin: 7.5 g/dL — ABNORMAL LOW (ref 12.0–15.0)
MCH: 26 pg (ref 26.0–34.0)
MCHC: 28.6 g/dL — ABNORMAL LOW (ref 30.0–36.0)
MCV: 90.7 fL (ref 80.0–100.0)
Platelets: 252 10*3/uL (ref 150–400)
RBC: 2.89 MIL/uL — ABNORMAL LOW (ref 3.87–5.11)
RDW: 23.5 % — ABNORMAL HIGH (ref 11.5–15.5)
WBC: 9.4 10*3/uL (ref 4.0–10.5)
nRBC: 0 % (ref 0.0–0.2)

## 2023-12-24 LAB — GLUCOSE, CAPILLARY
Glucose-Capillary: 124 mg/dL — ABNORMAL HIGH (ref 70–99)
Glucose-Capillary: 114 mg/dL — ABNORMAL HIGH (ref 70–99)
Glucose-Capillary: 124 mg/dL — ABNORMAL HIGH (ref 70–99)
Glucose-Capillary: 168 mg/dL — ABNORMAL HIGH (ref 70–99)
Glucose-Capillary: 140 mg/dL — ABNORMAL HIGH (ref 70–99)

## 2023-12-24 LAB — PHOSPHORUS: Phosphorus: 3.7 mg/dL (ref 2.5–4.6)

## 2023-12-24 LAB — MAGNESIUM: Magnesium: 2.3 mg/dL (ref 1.7–2.4)

## 2023-12-24 MED ORDER — FENTANYL 2500MCG IN NS 250ML (10MCG/ML) PREMIX INFUSION
0.0000 ug/h | INTRAVENOUS | Status: DC
Start: 2023-12-25 — End: 2023-12-25
  Administered 2023-12-24: 25 ug/h via INTRAVENOUS
  Filled 2023-12-24: qty 250

## 2023-12-24 MED ORDER — MIDAZOLAM HCL 2 MG/2ML IJ SOLN
1.0000 mg | INTRAMUSCULAR | Status: DC | PRN
  Administered 2023-12-25: 1 mg via INTRAVENOUS
  Filled 2023-12-24: qty 2

## 2023-12-24 MED ORDER — PIPERACILLIN-TAZOBACTAM 3.375 G IVPB
3.3750 g | Freq: Three times a day (TID) | INTRAVENOUS | Status: DC
  Administered 2023-12-24 – 2023-12-25 (×4): 3.375 g via INTRAVENOUS
  Filled 2023-12-24 (×4): qty 50

## 2023-12-24 MED ORDER — FENTANYL CITRATE PF 50 MCG/ML IJ SOSY
50.0000 ug | PREFILLED_SYRINGE | Freq: Once | INTRAMUSCULAR | Status: AC
  Administered 2023-12-24: 50 ug via INTRAVENOUS

## 2023-12-24 MED ORDER — VANCOMYCIN HCL 750 MG IV SOLR
750.0000 mg | INTRAVENOUS | Status: DC
  Administered 2023-12-25: 750 mg via INTRAVENOUS
  Filled 2023-12-24: qty 15

## 2023-12-24 MED ORDER — VANCOMYCIN HCL 1750 MG/350ML IV SOLN
1750.0000 mg | Freq: Once | INTRAVENOUS | Status: AC
  Administered 2023-12-24: 1750 mg via INTRAVENOUS
  Filled 2023-12-24: qty 350

## 2023-12-24 MED ORDER — LEVALBUTEROL HCL 0.63 MG/3ML IN NEBU: 0.6300 mg | INHALATION_SOLUTION | RESPIRATORY_TRACT | Status: DC | PRN

## 2023-12-24 MED ORDER — DEXMEDETOMIDINE HCL IN NACL 200 MCG/50ML IV SOLN: 0.0000 ug/kg/h | INTRAVENOUS | Status: DC

## 2023-12-24 MED ORDER — METOPROLOL TARTRATE 12.5 MG HALF TABLET: 12.5000 mg | ORAL_TABLET | Freq: Two times a day (BID) | ORAL | Status: DC

## 2023-12-24 MED ORDER — POTASSIUM CHLORIDE 20 MEQ PO PACK
40.0000 meq | PACK | Freq: Once | ORAL | Status: AC
  Administered 2023-12-24: 40 meq
  Filled 2023-12-24: qty 2

## 2023-12-24 MED ORDER — ALBUTEROL SULFATE (2.5 MG/3ML) 0.083% IN NEBU
2.5000 mg | INHALATION_SOLUTION | RESPIRATORY_TRACT | Status: DC
  Administered 2023-12-24: 2.5 mg via RESPIRATORY_TRACT
  Filled 2023-12-24: qty 3

## 2023-12-24 MED ORDER — FUROSEMIDE 10 MG/ML IJ SOLN
40.0000 mg | Freq: Once | INTRAMUSCULAR | Status: AC
  Administered 2023-12-24: 40 mg via INTRAVENOUS
  Filled 2023-12-24: qty 4

## 2023-12-24 NOTE — Plan of Care (Signed)
  Problem: Clinical Measurements: Goal: Cardiovascular complication will be avoided Outcome: Progressing   Problem: Pain Management: Goal: General experience of comfort will improve Outcome: Progressing   Problem: Safety: Goal: Ability to remain free from injury will improve Outcome: Progressing

## 2023-12-24 NOTE — Progress Notes (Signed)
   12/24/23 1053  Vent Select  Invasive or Noninvasive Invasive  Adult Vent Y  Airway 7.5 mm  Placement Date/Time: 12/12/23 1755   Placed By: ED Physician  Airway Device: Endotracheal Tube  ETT Types: Oral  Size (mm): 7.5 mm  Cuffed: Cuffed  Airway Equipment: Stylet;Bougie Stylet;Video Laryngoscope  Placement Confirmation: Direct Visualization...  Secured at (cm) 23 cm  Measured From Lips  Secured Location Center  Secured By English as a second language teacher No  Tube Holder Repositioned Yes  Prone position No  Head position Right  Cuff Pressure (cm H2O) Green OR 18-26 CmH2O  Site Condition Drainage (Comment)  Adult Ventilator Settings  Vent Type Servo i  Humidity HME  Vent Mode (S)  PRVC (Ended vent wean due to increased HR 180s per RN, pt weaned 3 hrs. PSV 5/5.)  Vt Set 390 mL  Set Rate 18 bmp  FiO2 (%) 30 %  PEEP 5 cmH20

## 2023-12-24 NOTE — Plan of Care (Signed)
  Problem: Nutrition: Goal: Adequate nutrition will be maintained Outcome: Progressing   

## 2023-12-24 NOTE — IPAL (Signed)
  Interdisciplinary Goals of Care Family Meeting   Date carried out: 12/24/2023  Location of the meeting: Bedside  Member's involved: Nurse Practitioner, Bedside Registered Nurse, and Family Member or next of kin  Durable Power of Attorney or acting medical decision maker: All three of the patient's daughters are present and making decisions together.     Discussion: We discussed goals of care for [facility] .  She has unfortunately suffered cardiac arrest and anoxic injury by exam and imaging. She has been marginal with SBT, which has been limited by agitation and rapid respiratory rate. They know their mother would not want to have a trach or be on a ventilator long term. The would like to give her a chance at extubation tomorrow if she meets criteria and would not want her to be re-intubated. Should she fail extubation, or not meet criteria, we will proceed with comfort care.   Code status:   Code Status: Do not attempt resuscitation (DNR) PRE-ARREST INTERVENTIONS DESIRED   Disposition: Continue current acute care  Time spent for the meeting: 19 minutes    Duayne Cal, NP  12/24/2023, 4:18 PM

## 2023-12-24 NOTE — Progress Notes (Signed)
eLink Physician-Brief Progress Note Patient Name: Jasmin Miller DOB: 27-Jun-1941 MRN: [id number]   Date of Service  12/24/2023  HPI/Events of Note  RN reports pt's BP is soft, 99/33 (58), HR 74.  States she did not give scheduled oxycodone or lopressor at 2200.  On precedex @ 0.7 mcg.  eICU Interventions  Lower precedex Dc amlodipin Decrease lopressor 12.5 bid - next dose in am only if BP improved     Intervention Category Intermediate Interventions: Hypotension - evaluation and management  Lucresia Simic V. Lori-Ann Lindfors 12/24/2023, 1:30 AM

## 2023-12-24 NOTE — Progress Notes (Signed)
Pharmacy Antibiotic Note  Jasmin Miller is a 83 y.o. female admitted on 12/12/2023 with shortness of breath. In the ED, patient became hypotensive and altered requiring vasopressors and intubation. Patient progressed to PEA cardiac arrest and received ~ 10 minutes of CPR prior to ROSC. Hemoglobin significantly low on admission, patient received multiple units of PRBC. Patient remains intubated. MRI consistent with low perfusion state injury, concern for anoxic brain injury superimposed on prior left PCA stroke. Patient intermittently following some commands; weaning trials ongoing. Pharmacy has been consulted for vancomycin and Zosyn dosing 1/16 for concern for HAP and hopefully to facilitate weaning from vent.  Plan: -Vancomycin 1750 mg IV x 1 followed by 750 mg IV q24h -Zosyn 3.375 g IV q8h extended infusion -Monitor renal function closely for necessary dose adjustments, if worsens would consider alternative regimen -Follow cultures and clinical progress for de-escalation as indicated  Height: 5\' 1"  (154.9 cm) Weight: 73 kg (160 lb 15 oz) IBW/kg (Calculated) : 47.8  Temp (24hrs), Avg:99.4 F (37.4 C), Min:98 F (36.7 C), Max:100.2 F (37.9 C)  Recent Labs  Lab 12/19/23 2015 12/21/23 0713 12/21/23 0748 12/21/23 2014 12/22/23 0355 12/23/23 0421 12/24/23 0408  WBC  --  7.8 9.2 12.0* 9.2 9.8 9.4  CREATININE 0.63 0.77  --   --  0.72 0.68 0.68    Estimated Creatinine Clearance: 48.7 mL/min (by C-G formula based on SCr of 0.68 mg/dL).    No Known Allergies  Antimicrobials this admission: Zosyn 1/16 >> Vancomycin 1/16 >> Unasyn 1/6 >> 1/12  Dose adjustments this admission: NA  Microbiology results: 1/16 Trach aspirate: 1/5 MRSA PCR: negative 1/4 BCx: ngF  Thank you for allowing pharmacy to be a part of this patient's care.  Pricilla Riffle, PharmD, BCPS Clinical Pharmacist 12/24/2023 10:58 AM

## 2023-12-24 NOTE — Progress Notes (Signed)
NAME:  Jasmin Miller, MRN:  [id number], DOB:  1941-03-22, LOS: 12 ADMISSION DATE:  12/12/2023, CONSULTATION DATE:  12/12/2023 REFERRING MD: Jeraldine Loots - EDP, CHIEF COMPLAINT:  Hypotension, acute blood loss anemia   History of Present Illness:  83 year old female on with PMHx below who presented for SOB x 1 day.  Labs significant for Hg 4, Plt 246, iSTAT LA 13, K 6.0, CO2 7 BUN/Cr 19/1.37. Trop 133. BNP 2599.  VBG 7.06/30/50/7.5.  CXR no infiltrate effusion or edema. Influenza, RSV and COVID negative.  In the ED patient became hypotensive and loss of mental status. Levophed gtt started. Patient intubated in the ED due to airway protection due to obtunded status. 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  Progressed to PEA Cardiac arrest required CPR x 10 min (estimated) and achieved ROSC in the ED. Placed on epi gtt. Additional ER interventions: PRBC x fusion (4 Units ordered), in addition to crystalloid resuscitation. Placed on Norepi, phenylephrine and epi gtt. Cultures sent. EDP spoke to family. No further CPR but cont supportive care. Remained hypotensive and in low perfusion state post resuscitation PCCM asked to admit   Family at bedside. Patient has shown decline over past 2 months with poor oral intake, only eating soup. She has remained active in cooking and cleaning at home on her own. Has become very weak over the past 2 months.   Pertinent Medical History:  DM2, HTN, CHF, IDA, atrial fibrillation on Eliquis  Significant Hospital Events: Including procedures, antibiotic start and stop dates in addition to other pertinent events   1/4 Presented w/ CC SOB, had lactate 13, Hgb 4, BNP 2599. D-dimer negative While in ER had loss of LOC, became hypoxic, intubated. Had cardiac arrest. ACLS provided. Time to ROSC estimated ~10 minutes. Remained in  low perfusion state in spite of blood, fluid resuscitation and pressors. Made DNR in ER should arrest occur again. PCCM asked to admit.  1/5 Epi off. Completed total 4 PRBC and 2 FFP. More awake.  1/6 Acidosis resolved. LFTs improving. H&H stable. PLTs stable/improving. Renal fxn a little worse. Still on pressors. Still on vent. Started on fent gtt on 5th. Less responsive. Changed fent to dex. Placed left internal jugular CVL. Co-ox > 70. CVP suggesting adequate preload. Started unasyn empirically for fever, leukocytosis and elevated PCT  1/7 Weaning Norepi, weaning dex. 1/8 No acute issues overnight, low dose pressors briefly needed. Remains on low dose Precedex. MRI brain 1. Scattered diffusion signal abnormality involving the cortical to subcortical aspects of both cerebral hemispheres as well as the cerebellum, consistent with evolving early subacute ischemic infarcts. These changes are largely watershed in distribution, and are consistent with a hypoperfusion injury related to recent cardiac arrest. Scattered foci of associated petechial hemorrhage without frank hemorrhagic transformation or significant regional mass effect; underlying age-appropriate cerebral volume loss with mild chronic microvascular ischemic disease, with a small remote left PCA distribution infarct involving the left occipital lobe. 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 1/9 Off pressors 1/10 No sig improvement 1/14 Following some commands and tolerating SBT  Interim History / Subjective:  Only weaned 2.5 hours yesterday with rapid RR throughout.  Was able to follow simple commands and mouth words around ETT.  Agitated at times requiring continuous sedation.  Enteral agents started.  CXR pending this morning.  4L positive for the admission Discussion with  family yesterday regarding GOC. They understand she suffered brain injury, but are encouraged she has been able to SBT some and mouth  words. Hopeful for extubation. Would not want tracheostomy.   Objective:  Blood pressure 130/71, pulse 74, temperature 99.2 F (37.3 C), temperature source Axillary, resp. rate (!) 25, height 5\' 1"  (1.549 m), weight 73 kg, SpO2 98%. CVP:  [0 mmHg-18 mmHg] 7 mmHg  Vent Mode: PRVC FiO2 (%):  [30 %] 30 % Set Rate:  [18 bmp] 18 bmp Vt Set:  [390 mL] 390 mL PEEP:  [5 cmH20] 5 cmH20 Pressure Support:  [8 cmH20-10 cmH20] 8 cmH20 Plateau Pressure:  [15 cmH20] 15 cmH20   Intake/Output Summary (Last 24 hours) at 12/24/2023 0714 Last data filed at 12/24/2023 1610 Gross per 24 hour  Intake 2326.36 ml  Output 900 ml  Net 1426.36 ml   Filed Weights   12/22/23 0403 12/23/23 0433 12/24/23 0420  Weight: 68.8 kg 68.9 kg 73 kg   Physical Examination:  General:  Elderly female on mechanical ventilator Neuro:  Sedated, arouses to voice and follows simple commands HEENT:  [place]/AT, PERRL, no JVD Cardiovascular:  RRR, no MRG Lungs:  Diminished right base, otherwise clear Abdomen:  Soft, non-distended, non-tender.  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 Prognosis for meaningful and functional recovery very poor.  - Vent support for airway protection - Precedex for ETT related pain and agitation. RASS goal 0 to -1 - Neuroprotective measures: HOB > 30 degrees, normoglycemia, normothermia, electrolytes WNL  Ventilator management in the setting of post-arrest encephalopathy ? 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 Got more blood 1/13 - Monitor for signs of active bleeding - Transfuse for Hgb < 7.0, Plt < 20K or hemodynamically significant bleeding - not a candidate for anticoagulation  Chronic systolic heart failure (EF 40-45%) HTN Atrial fibrillation History NSVT Hypertension Echo 12/2023 with EF 55-60%, normal LV function, no RWMAs, reduced RV systolic function, degenerative MV. No longer systemic AC candidate given this is at least the second life-threatening bleed. - Continue Norvasc, metoprolol 50mg  BID - Cardiac monitoring - Optimize electrolytes for K > 4, Mg > 2 - Not a candidate for [facility] given life-threatening bleed  T2DM - SSI - CBGs Q4H - Goal CBG 140-180  Advanced directives - DNR code status with interventions desired - Ongoing GOC discussions pending recovery trajectory; family is all present at this point - PMT consult if  desired by family  Best Practice: (right click and "Reselect all SmartList Selections" daily)   Diet/type: tubefeeds DVT prophylaxis other Pressure ulcer(s): pressure ulcer assessment deferred  GI prophylaxis: H2B Lines: N/A Foley:  Yes, and it is still needed Code Status:  full code Last date of multidisciplinary goals of care discussion [1/4 discussed with family at bedside, agree with DNR; given the findings of MRI daughters now discussing GOC   Critical care time: 37  minutes     Joneen Roach, AGACNP-BC [facility]  See Amion for personal pager PCCM on call pager [phone] until 7pm. Please call Elink 7p-7a. [phone]  12/24/2023 7:14 AM

## 2023-12-24 NOTE — Progress Notes (Signed)
Hold CPT (BED) due to agitation, family is at the bedside. Airway is stable.

## 2023-12-24 NOTE — Progress Notes (Signed)
   12/24/23 0745  Adult Ventilator Settings  Vent Mode (S)  CPAP;PSV (Changed over to wean by provider.)  FiO2 (%) 30 %  Pressure Support (S)  5 cmH20  PEEP (S)  5 cmH20

## 2023-12-25 DIAGNOSIS — I469 Cardiac arrest, cause unspecified: Secondary | ICD-10-CM | POA: Diagnosis not present

## 2023-12-25 LAB — GLUCOSE, CAPILLARY
Glucose-Capillary: 121 mg/dL — ABNORMAL HIGH (ref 70–99)
Glucose-Capillary: 122 mg/dL — ABNORMAL HIGH (ref 70–99)
Glucose-Capillary: 136 mg/dL — ABNORMAL HIGH (ref 70–99)
Glucose-Capillary: 138 mg/dL — ABNORMAL HIGH (ref 70–99)

## 2023-12-25 LAB — CBC
HCT: 28.3 % — ABNORMAL LOW (ref 36.0–46.0)
Hemoglobin: 8.2 g/dL — ABNORMAL LOW (ref 12.0–15.0)
MCH: 26.4 pg (ref 26.0–34.0)
MCHC: 29 g/dL — ABNORMAL LOW (ref 30.0–36.0)
MCV: 91 fL (ref 80.0–100.0)
Platelets: 364 10*3/uL (ref 150–400)
RBC: 3.11 MIL/uL — ABNORMAL LOW (ref 3.87–5.11)
RDW: 23.3 % — ABNORMAL HIGH (ref 11.5–15.5)
WBC: 13.3 10*3/uL — ABNORMAL HIGH (ref 4.0–10.5)
nRBC: 0 % (ref 0.0–0.2)

## 2023-12-25 LAB — BASIC METABOLIC PANEL
Anion gap: 9 (ref 5–15)
BUN: 43 mg/dL — ABNORMAL HIGH (ref 8–23)
CO2: 21 mmol/L — ABNORMAL LOW (ref 22–32)
Calcium: 7.9 mg/dL — ABNORMAL LOW (ref 8.9–10.3)
Chloride: 113 mmol/L — ABNORMAL HIGH (ref 98–111)
Creatinine, Ser: 0.73 mg/dL (ref 0.44–1.00)
GFR, Estimated: >60 mL/min (ref >60)
Glucose, Bld: 156 mg/dL — ABNORMAL HIGH (ref 70–99)
Potassium: 3.9 mmol/L (ref 3.5–5.1)
Sodium: 143 mmol/L (ref 135–145)

## 2023-12-25 LAB — PHOSPHORUS: Phosphorus: 3.8 mg/dL (ref 2.5–4.6)

## 2023-12-25 LAB — MAGNESIUM: Magnesium: 2.3 mg/dL (ref 1.7–2.4)

## 2023-12-25 MED ORDER — GLYCOPYRROLATE 0.2 MG/ML IJ SOLN
0.2000 mg | INTRAMUSCULAR | Status: DC | PRN
  Administered 2023-12-25: 0.2 mg via INTRAVENOUS
  Filled 2023-12-25: qty 1

## 2023-12-25 MED ORDER — GLYCOPYRROLATE 0.2 MG/ML IJ SOLN: 0.2000 mg | INTRAMUSCULAR | Status: DC | PRN

## 2023-12-25 MED ORDER — FENTANYL BOLUS VIA INFUSION
25.0000 ug | INTRAVENOUS | Status: DC | PRN
  Administered 2023-12-25: 100 ug via INTRAVENOUS
  Administered 2023-12-25: 50 ug via INTRAVENOUS

## 2023-12-25 MED ORDER — ACETAMINOPHEN 325 MG PO TABS: 650.0000 mg | ORAL_TABLET | Freq: Four times a day (QID) | ORAL | Status: DC | PRN

## 2023-12-25 MED ORDER — METOPROLOL TARTRATE 5 MG/5ML IV SOLN: 5.0000 mg | Freq: Four times a day (QID) | INTRAVENOUS | Status: DC | PRN

## 2023-12-25 MED ORDER — FENTANYL BOLUS VIA INFUSION
100.0000 ug | INTRAVENOUS | Status: DC | PRN
  Administered 2023-12-25 (×7): 100 ug via INTRAVENOUS

## 2023-12-25 MED ORDER — SODIUM CHLORIDE 0.9% FLUSH: 3.0000 mL | Freq: Two times a day (BID) | INTRAVENOUS | Status: DC

## 2023-12-25 MED ORDER — SODIUM CHLORIDE 0.9% FLUSH: 3.0000 mL | INTRAVENOUS | Status: DC | PRN

## 2023-12-25 MED ORDER — FENTANYL CITRATE PF 50 MCG/ML IJ SOSY
25.0000 ug | PREFILLED_SYRINGE | INTRAMUSCULAR | Status: DC | PRN
Start: 2023-12-25 — End: 2023-12-25

## 2023-12-25 MED ORDER — FUROSEMIDE 10 MG/ML IJ SOLN
40.0000 mg | Freq: Once | INTRAMUSCULAR | Status: AC
  Administered 2023-12-25: 40 mg via INTRAVENOUS
  Filled 2023-12-25: qty 4

## 2023-12-25 MED ORDER — ACETAMINOPHEN 650 MG RE SUPP: 650.0000 mg | Freq: Four times a day (QID) | RECTAL | Status: DC | PRN

## 2023-12-25 MED ORDER — SODIUM CHLORIDE 0.9% FLUSH
3.0000 mL | Freq: Two times a day (BID) | INTRAVENOUS | Status: DC
  Administered 2023-12-25: 10 mL via INTRAVENOUS

## 2023-12-25 MED ORDER — GLYCOPYRROLATE 1 MG PO TABS: 1.0000 mg | ORAL_TABLET | ORAL | Status: DC | PRN

## 2023-12-25 MED ORDER — FENTANYL CITRATE PF 50 MCG/ML IJ SOSY
50.0000 ug | PREFILLED_SYRINGE | INTRAMUSCULAR | Status: DC | PRN
  Administered 2023-12-25: 50 ug via INTRAVENOUS
  Filled 2023-12-25 (×2): qty 1

## 2023-12-25 MED ORDER — FENTANYL 2500MCG IN NS 250ML (10MCG/ML) PREMIX INFUSION
0.0000 ug/h | INTRAVENOUS | Status: DC
Start: 2023-12-25 — End: 2023-12-25
  Administered 2023-12-25: 50 ug/h via INTRAVENOUS

## 2023-12-25 MED ORDER — SODIUM CHLORIDE 0.9% FLUSH
3.0000 mL | INTRAVENOUS | Status: DC | PRN
Start: 2023-12-25 — End: 2023-12-25

## 2023-12-25 MED ORDER — MIDAZOLAM HCL 2 MG/2ML IJ SOLN
2.0000 mg | INTRAMUSCULAR | Status: DC | PRN
  Administered 2023-12-25: 2 mg via INTRAVENOUS
  Filled 2023-12-25: qty 2

## 2023-12-25 MED ORDER — POLYVINYL ALCOHOL 1.4 % OP SOLN: 1.0000 [drp] | Freq: Four times a day (QID) | OPHTHALMIC | Status: DC | PRN

## 2023-12-25 MED ORDER — FENTANYL CITRATE PF 50 MCG/ML IJ SOSY: 50.0000 ug | PREFILLED_SYRINGE | INTRAMUSCULAR | Status: DC | PRN

## 2023-12-26 LAB — CULTURE, RESPIRATORY W GRAM STAIN

## 2024-01-09 NOTE — Plan of Care (Signed)
  Problem: Fluid Volume: Goal: Ability to maintain a balanced intake and output will improve Outcome: Progressing   Problem: Nutritional: Goal: Maintenance of adequate nutrition will improve Outcome: Progressing   Problem: Elimination: Goal: Will not experience complications related to bowel motility Outcome: Progressing Goal: Will not experience complications related to urinary retention Outcome: Progressing

## 2024-01-09 NOTE — TOC Progression Note (Signed)
Transition of Care [facility]) - Progression Note    Patient Details  Name: Jasmin Miller MRN: [id number] Date of Birth: February 22, 1941  Transition of Care [facility]) CM/SW Contact  Lavenia Atlas, RN Phone Number: , 2:46 PM  Clinical Narrative:   Per chart review patient currently in [facility] ICU due to cardiac arrest and on comfort care.   TOC following for any additional needs.    Expected Discharge Plan:  (TBD) Barriers to Discharge: Continued Medical Work up  Expected Discharge Plan and Services   Discharge Planning Services: CM Consult   Living arrangements for the past 2 months: Single Family Home                                       Social Determinants of Health (SDOH) Interventions SDOH Screenings   Food Insecurity: No Food Insecurity (12/18/2023)  Housing: Low Risk  (12/18/2023)  Transportation Needs: No Transportation Needs (12/18/2023)  Utilities: Not At Risk (12/18/2023)  Social Connections: Patient Unable To Answer (12/12/2023)  Tobacco Use: Medium Risk (08/07/2023)    Readmission Risk Interventions    12/18/2023    2:44 PM  Readmission Risk Prevention Plan  Transportation Screening Complete  PCP or Specialist Appt within 5-7 Days Complete  Home Care Screening Complete  Medication Review (RN CM) Complete

## 2024-01-09 NOTE — Progress Notes (Signed)
 Cross covering ICU physician  Called to bedside for pt who is awake alert and tearful. On vent. She is not tolerating sbt at this time. On precedex at 0.04 but any increase reportedly drops pressure. Will transition to fentanyl infusion at this time, as that seems to resolve her distress without dropping pressure. Add prn versed for now.   RN updated at bedside.

## 2024-01-09 NOTE — Progress Notes (Signed)
    0742  Daily Weaning Assessment  Patient response (S)  Failed SBT terminated (Increased to PSV 10, low RR.)   Pt is currently on PSV 10/5, 30%, tolerating well.

## 2024-01-09 NOTE — Plan of Care (Signed)

## 2024-01-09 NOTE — Progress Notes (Signed)
NAME:  Jasmin Miller, MRN:  [id number], DOB:  05-04-1941, LOS: 13 ADMISSION DATE:  12/12/2023, CONSULTATION DATE:  12/12/2023 REFERRING MD: Jeraldine Loots - EDP, CHIEF COMPLAINT:  Hypotension, acute blood loss anemia   History of Present Illness:  83 year old female on with PMHx below who presented for SOB x 1 day.  Labs significant for Hg 4, Plt 246, iSTAT LA 13, K 6.0, CO2 7 BUN/Cr 19/1.37. Trop 133. BNP 2599.  VBG 7.06/30/50/7.5.  CXR no infiltrate effusion or edema. Influenza, RSV and COVID negative.  In the ED patient became hypotensive and loss of mental status. Levophed gtt started. Patient intubated in the ED due to airway protection due to obtunded status. 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  Progressed to PEA Cardiac arrest required CPR x 10 min (estimated) and achieved ROSC in the ED. Placed on epi gtt. Additional ER interventions: PRBC x fusion (4 Units ordered), in addition to crystalloid resuscitation. Placed on Norepi, phenylephrine and epi gtt. Cultures sent. EDP spoke to family. No further CPR but cont supportive care. Remained hypotensive and in low perfusion state post resuscitation PCCM asked to admit   Family at bedside. Patient has shown decline over past 2 months with poor oral intake, only eating soup. She has remained active in cooking and cleaning at home on her own. Has become very weak over the past 2 months.   Pertinent Medical History:  DM2, HTN, CHF, IDA, atrial fibrillation on Eliquis  Significant Hospital Events: Including procedures, antibiotic start and stop dates in addition to other pertinent events   1/4 Presented w/ CC SOB, had lactate 13, Hgb 4, BNP 2599. D-dimer negative While in ER had loss of LOC, became hypoxic, intubated. Had cardiac arrest. ACLS provided. Time to ROSC estimated ~10 minutes. Remained in  low perfusion state in spite of blood, fluid resuscitation and pressors. Made DNR in ER should arrest occur again. PCCM asked to admit.  1/5 Epi off. Completed total 4 PRBC and 2 FFP. More awake.  1/6 Acidosis resolved. LFTs improving. H&H stable. PLTs stable/improving. Renal fxn a little worse. Still on pressors. Still on vent. Started on fent gtt on 5th. Less responsive. Changed fent to dex. Placed left internal jugular CVL. Co-ox > 70. CVP suggesting adequate preload. Started unasyn empirically for fever, leukocytosis and elevated PCT  1/7 Weaning Norepi, weaning dex. 1/8 No acute issues overnight, low dose pressors briefly needed. Remains on low dose Precedex. MRI brain 1. Scattered diffusion signal abnormality involving the cortical to subcortical aspects of both cerebral hemispheres as well as the cerebellum, consistent with evolving early subacute ischemic infarcts. These changes are largely watershed in distribution, and are consistent with a hypoperfusion injury related to recent cardiac arrest. Scattered foci of associated petechial hemorrhage without frank hemorrhagic transformation or significant regional mass effect; underlying age-appropriate cerebral volume loss with mild chronic microvascular ischemic disease, with a small remote left PCA distribution infarct involving the left occipital lobe. 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 1/9 Off pressors 1/10 No sig improvement 1/14 Following some commands and tolerating SBT  Interim History / Subjective:   Patient tolerating PSV trial this AM Family at the bedside, discussed plan for extubation and if she has issues with her breathing post-extubation that we will make her comfortable. They all agreed.   Objective:  Blood pressure (!) 167/71, pulse (!) 115, temperature  99.1 F (37.3 C), temperature source Axillary, resp. rate 15, height 5\' 1"  (1.549 m), weight 72 kg, SpO2 99%. CVP:  [6 mmHg-23 mmHg] 6  mmHg  Vent Mode: PRVC FiO2 (%):  [30 %] 30 % Set Rate:  [18 bmp] 18 bmp Vt Set:  [390 mL] 390 mL PEEP:  [5 cmH20] 5 cmH20 Pressure Support:  [5 cmH20] 5 cmH20 Plateau Pressure:  [13 cmH20-20 cmH20] 13 cmH20   Intake/Output Summary (Last 24 hours) at  0719 Last data filed at  0657 Gross per 24 hour  Intake 1835.03 ml  Output 775 ml  Net 1060.03 ml   Filed Weights   12/23/23 0433 12/24/23 0420  0500  Weight: 68.9 kg 73 kg 72 kg   Physical Examination:  General:  Elderly female on mechanical ventilator Neuro: lightly sedated, arouses to voice and follows simple commands HEENT:  [place]/AT, PERRL, no JVD Cardiovascular:  RRR, no MRG Lungs:  Diminished right base, otherwise clear Abdomen:  Soft, non-distended, non-tender.  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 Prognosis for meaningful and functional recovery very poor.  - Neuroprotective measures: HOB > 30 degrees, normoglycemia, normothermia, electrolytes WNL  Ventilator management in the setting of post-arrest encephalopathy ? Possible HCAP - S/p Unasyn x 5-day course (end 1/12) - Extubate today, will not reintubate if developes respiratory distress or failure - f/u tracheal aspirate - Empiric antibiotics started 1/16 to facilitate weaning based on CXR  Hypernatremia w/ total body volume overload AKI superimposed on CKD stage 3b, improved Na 143  - Trend BMP - FWF 200mg  Q4H continue - Monitor I&O  Acute blood loss anemia; FOBT +  Thrombocytopenia S/p  PRBC x 4U &  FFP x2; Hgb slowly drifting down over course of hospital stay. Got more blood 1/13 - Monitor for signs of active bleeding - Transfuse for Hgb < 7.0, Plt < 20K or hemodynamically significant bleeding - not a candidate for anticoagulation  Chronic systolic heart failure (EF 40-45%) HTN Atrial fibrillation History NSVT Hypertension Echo 12/2023 with EF 55-60%, normal LV function, no RWMAs, reduced RV systolic function, degenerative MV. No longer systemic AC candidate given this is at least the second life-threatening bleed. - Continue Norvasc, metoprolol 50mg  BID - Cardiac monitoring - Optimize electrolytes for K > 4, Mg > 2 - Not a candidate for [facility] given life-threatening bleed  T2DM - SSI - CBGs Q4H - Goal CBG 140-180  Advanced directives - DNR code status with interventions desired - Ongoing GOC discussions pending recovery trajectory; family is all present at this point - PMT consult if desired by family  Best Practice: (right click and "Reselect all SmartList Selections" daily)   Diet/type: NPO, will need cortrak for meds and tubefeeds DVT prophylaxis other Pressure ulcer(s): pressure ulcer assessment deferred  GI prophylaxis: H2B Lines: N/A Foley:  Yes, and it is still needed Code Status:  full code Last date of multidisciplinary goals of care discussion [1/4 discussed with family at bedside, agree with DNR; plan to extubate today and make comfortable if does not do well off ventilator  Critical care time: 33 minutes  Melody Comas, MD [facility] Office: [phone]   See Amion for personal pager PCCM on call pager [phone] until 7pm. Please call Elink 7p-7a. [phone]

## 2024-01-09 NOTE — Procedures (Signed)
Extubation Procedure Note  Patient Details:   Name: Jasmin Miller DOB: 05/06/1941 MRN: [id number]   Airway Documentation:    Vent end date: (S)  Vent end time: (S) 0853   Evaluation  O2 sats: stable throughout Complications: No apparent complications Patient did tolerate procedure well. Bilateral Breath Sounds: Diminished   Yes Pt was extubated to 2 L [place], oral suctioning completed, good, productive cough, family at the bedside.   Jasmin Miller Jasmin Miller , 9:02 AM

## 2024-01-09 NOTE — Progress Notes (Signed)
    1500  Spiritual Encounters  Type of Visit Initial  Care provided to: Pt and family  Conversation partners present during encounter Nurse  Referral source Clinical staff  Reason for visit Patient death  OnCall Visit No  Spiritual Framework  Presenting Themes Meaning/purpose/sources of inspiration;Values and beliefs  Patient Stress Factors None identified  Family Stress Factors Loss;Loss of control;Major life changes  Goals  Self/Personal Goals Address Grief and Bereavement  Interventions  Spiritual Care Interventions Made Established relationship of care and support;Compassionate presence;Reflective listening;Normalization of emotions;Reconciliation with self/others;Narrative/life review;Explored values/beliefs/practices/strengths;Meaning making;Bereavement/grief support;Prayer  Intervention Outcomes  Outcomes Connection to spiritual care;Awareness around self/spiritual resourses;Connection to values and goals of care;Reduced anxiety;Awareness of support;Patient family open to resources;Connected to spiritual community  Spiritual Care Plan  Spiritual Care Issues Still Outstanding No further spiritual care needs at this time (see row info)   Chaplain met with patient and family gathered as patient passed.  Provided spiritual care and comfort to bereaved family at bedside.

## 2024-01-09 NOTE — Progress Notes (Signed)
Nutrition Brief Note  Chart reviewed. Pt now transitioned to comfort care.  No further nutrition interventions planned at this time.   Shelle Iron RD, LDN Contact via [employer].

## 2024-01-09 NOTE — Death Summary Note (Signed)
DEATH SUMMARY   Patient Details  Name: Jasmin Miller MRN: [id number] DOB: [birth year]  Admission/Discharge Information   Admit Date:  01-10-2024  Date of Death: Date of Death: January 23, 2024  Time of Death: Time of Death: 1537  Length of Stay: 2024/02/19  Referring Physician: Lindaann Pascal, PA-C   Reason(s) for Hospitalization  Severe Anemia  Diagnoses  Preliminary cause of death:  Anoxic Brain Injury   Secondary Diagnoses (including complications and co-morbidities):  Principal Problem:   Cardiac arrest (HCC) Active Problems:   ABLA (acute blood loss anemia)   Malnutrition of moderate degree   Acute respiratory failure with hypoxia (HCC)   Hemorrhagic shock (HCC)   Anoxic brain injury (HCC) S/p in-hospital cardiac arrest 2/2 severe anemia and metabolic acidosis Hemorrhagic shock Lactic acidosis Metabolic acidosis Circulatory shock post arrest Hypophosphatemia Hypotension Medication related Shock liver Thrombocytpoenia Chronic systolic heart failure (EF 40-45%) HTN Atrial fibrillation History NSVT Hypertension DMII   Brief Hospital Course (including significant findings, care, treatment, and services provided and events leading to death)  Jasmin Miller is a 83 y.o. year old female with PMHx below who presented for SOB x 1 day.  Labs significant for Hg 4, Plt 246, iSTAT LA 13, K 6.0, CO2 7 BUN/Cr 19/1.37. Trop 133. BNP 2599.  VBG 7.06/30/50/7.5.  CXR no infiltrate effusion or edema. Influenza, RSV and COVID negative.   In the ED patient became hypotensive and loss of mental status. Levophed gtt started. Patient intubated in the ED due to airway protection due to obtunded status. 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   Progressed to PEA Cardiac arrest required CPR x 10 min (estimated) and achieved ROSC in the ED. Placed on epi  gtt. Additional ER interventions: PRBC x fusion (4 Units ordered), in addition to crystalloid resuscitation. Placed on Norepi, phenylephrine and epi gtt. Cultures sent. EDP spoke to family. No further CPR but cont supportive care. Remained hypotensive and in low perfusion state post resuscitation PCCM asked to admit    Family at bedside. Patient has shown decline over past 2 months with poor oral intake, only eating soup. She has remained active in cooking and cleaning at home on her own. Has become very weak over the past 2 months.    1/4 Presented w/ CC SOB, had lactate 13, Hgb 4, BNP 2599. D-dimer negative While in ER had loss of LOC, became hypoxic, intubated. Had cardiac arrest. ACLS provided. Time to ROSC estimated ~10 minutes. Remained in low perfusion state in spite of blood, fluid resuscitation and pressors. Made DNR in ER should arrest occur again. PCCM asked to admit.  1/5 Epi off. Completed total 4 PRBC and 2 FFP. More awake.  1/6 Acidosis resolved. LFTs improving. H&H stable. PLTs stable/improving. Renal fxn a little worse. Still on pressors. Still on vent. Started on fent gtt on 5th. Less responsive. Changed fent to dex. Placed left internal jugular CVL. Co-ox > 70. CVP suggesting adequate preload. Started unasyn empirically for fever, leukocytosis and elevated PCT  1/7 Weaning Norepi, weaning dex. 1/8 No acute issues overnight, low dose pressors briefly needed. Remains on low dose Precedex. MRI brain 1. Scattered diffusion signal abnormality involving the cortical to subcortical aspects of both cerebral hemispheres as well as the cerebellum, consistent with evolving early subacute ischemic infarcts. These changes are largely watershed in distribution, and are consistent with a hypoperfusion injury related to recent  cardiac arrest. 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 1/9 Off pressors 1/10 No sig improvement 1/14 Following some commands and tolerating SBT but developed tachypnea 1/15 - 1/16 continued to tire out while on SBT 1/17 family discussion held and recommendation for one way extubation with transition to comfort care if not doing well. She developed progressive respiratory distress after extubation and placed on comfort care measures. She passed away at 1537.   Pertinent Labs and Studies  Significant Diagnostic Studies DG CHEST PORT 1 VIEW Result Date: 12/24/2023 CLINICAL DATA:  Intubated. EXAM: PORTABLE CHEST 1 VIEW COMPARISON:  Chest x-ray dated December 14, 2023. FINDINGS: The patient is rotated to the left. Unchanged endotracheal tube in place with the tip 4.2 cm above the carina. Unchanged enteric tube and left internal jugular central venous catheter. Stable cardiomediastinal silhouette. Continued central pulmonary vascular congestion. New layering small bilateral pleural effusions with bibasilar opacities, presumably atelectasis. No pneumothorax. No acute osseous abnormality. IMPRESSION: 1. New layering small bilateral pleural effusions with bibasilar atelectasis. Electronically Signed   By: Obie Dredge M.D.   On: 12/24/2023 08:54   MR BRAIN WO CONTRAST Result Date: 12/16/2023 CLINICAL DATA:  Initial evaluation for mental status change, concern for anoxic injury status post arrest. EXAM: MRI HEAD WITHOUT CONTRAST TECHNIQUE: Multiplanar, multiecho pulse sequences of the brain and surrounding structures were obtained without intravenous contrast. COMPARISON:  Prior CT from 12/12/2023. FINDINGS: Brain: Age-appropriate cerebral volume loss. Patchy T2/FLAIR hyperintensity involving the supratentorial cerebral white matter, consistent with  chronic small vessel ischemic disease, mild in nature. Small remote left PCA distribution infarct involving the left occipital lobe noted. Scattered diffusion signal abnormality seen involving the cortical to subcortical aspects of both cerebral hemispheres as well as the cerebellum, consistent with evolving early subacute ischemic infarcts. These changes are largely watershed in distribution, and are consistent with a hypoperfusion injury related to recent cardiac arrest. No significant mass effect. Scattered foci of associated petechial hemorrhage without frank hemorrhagic transformation or significant regional mass effect. 1 cm extra-axial mass within the right retro clival region consistent with a small meningioma (series 14, image 57). Associated adjacent dural thickening. No significant mass effect or edema. No other mass lesion or midline shift. No hydrocephalus or extra-axial fluid collection. Pituitary gland suprasellar region within normal limits. Vascular: Major intracranial vascular flow voids are maintained. Skull and upper cervical spine: Craniocervical junction within normal limits. Bone marrow signal intensity normal. Small lipoma noted at the right frontal scalp. Sinuses/Orbits: Globes and orbital soft tissues within normal limits. Pa mild scattered mucosal thickening present about the ethmoidal air cells. Paranasal sinuses are otherwise clear. Trace right mastoid effusion noted, of doubtful significance. Patient is intubated. Other: None. IMPRESSION: 1. Scattered diffusion signal abnormality involving the cortical to subcortical aspects of both cerebral hemispheres as well as the cerebellum, consistent with evolving early subacute ischemic infarcts. These changes are largely watershed in distribution, and are consistent with a hypoperfusion injury related to recent cardiac arrest. Scattered foci of associated petechial hemorrhage without frank hemorrhagic transformation or significant regional mass  effect. 2. 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 3. 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 Electronically Signed   By: Rise Mu M.D.   On: 12/16/2023 18:55   DG CHEST PORT 1 VIEW Result Date: 12/14/2023 CLINICAL DATA:  Tube placement EXAM: PORTABLE CHEST 1 VIEW COMPARISON:  X-ray 12/12/2023 and older FINDINGS: Stable ET tube. New left IJ catheter with tip of the SVC right atrial junction. No pneumothorax. New enteric tube with the tip extending beneath the diaphragm and tip overlying the fundus of the stomach. Stable enlarged cardiopericardial silhouette with calcified aorta. Central vascular congestion. No pneumothorax, effusion or consolidation. Overlapping cardiac leads. Degenerative changes of the spine. IMPRESSION: New left IJ catheter.  No pneumothorax.  New enteric tube. Enlarged heart with slight increase in vascular congestion. Electronically Signed   By: Karen Kays M.D.   On: 12/14/2023 13:33   ECHOCARDIOGRAM LIMITED Result Date: 12/13/2023    ECHOCARDIOGRAM LIMITED REPORT   Patient Name:   Jasmin Miller Date of Exam: 12/13/2023 Medical Rec #:  [id number]      Height:       61.0 in Accession #:    [id number]     Weight:       136.5 lb Date of Birth:  Jul 06, 1941      BSA:          1.606 m Patient Age:    82 years       BP:           122/47 mmHg Patient Gender: F              HR:           87 bpm. Exam Location:  Inpatient Procedure: Limited Echo, Limited Color Doppler and Cardiac Doppler Indications:    Cardiac arrest  History:        Patient has prior history of Echocardiogram examinations, most                 recent 06/29/2023. CHF, Arrythmias:PVC, Atrial Fibrillation and                 Tachycardia, Signs/Symptoms:Dyspnea; Risk Factors:Hypertension,                 Diabetes, Dyslipidemia and Former Smoker.   Sonographer:    Vern Claude Referring Phys: [id number] Bettina Gavia Zacharee Gaddie IMPRESSIONS  1. Limited Echo.  2. Left ventricular ejection fraction, by estimation, is 55 to 60%. The left ventricle has normal function. The left ventricle has no regional wall motion abnormalities.  3. Right ventricular systolic function reduced. The right ventricular size is normal.  4. The mitral valve is degenerative. Severe mitral valve regurgitation.  5. The aortic valve was not well visualized. Aortic valve regurgitation is not visualized. No aortic stenosis is present. Comparison(s): A prior study was performed on 06/29/2023. LVEF was reported 40-45% and now 55-60%, MR remains severe. FINDINGS  Left Ventricle: Left ventricular ejection fraction, by estimation, is 55 to 60%. The left ventricle has normal function. The left ventricle has no regional wall motion abnormalities. Right Ventricle: The right ventricular size is normal. Right ventricular systolic function reduced. Pericardium: There is no evidence of pericardial effusion. Mitral Valve: The mitral valve is degenerative in appearance. Severe mitral valve regurgitation. Tricuspid Valve: The tricuspid valve is grossly normal. Tricuspid valve regurgitation is mild . No evidence of tricuspid stenosis. Aortic Valve: The aortic valve was not well visualized. Aortic valve regurgitation is not visualized. No aortic stenosis is present. Pulmonic Valve: The pulmonic valve was not well visualized. Venous: IVC assessment  for right atrial pressure unable to be performed due to mechanical ventilation. IAS/Shunts: There is right bowing of the interatrial septum, suggestive of elevated left atrial pressure. LEFT VENTRICLE PLAX 2D LVIDd:         5.10 cm LVIDs:         3.45 cm LV PW:         0.70 cm LV IVS:        1.00 cm  LV Volumes (MOD) LV vol d, MOD A2C: 238.0 ml LV vol d, MOD A4C: 192.0 ml LV vol s, MOD A2C: 128.0 ml LV vol s, MOD A4C: 101.0 ml LV SV MOD A2C:     110.0 ml LV SV MOD A4C:      192.0 ml LV SV MOD BP:      103.5 ml RIGHT VENTRICLE            IVC RV S prime:     6.74 cm/s  IVC diam: 2.40 cm MR Peak grad:   110.7 mmHg   TRICUSPID VALVE MR Mean grad:   71.0 mmHg    TR Peak grad:   49.3 mmHg MR Vmax:        526.00 cm/s  TR Vmax:        351.00 cm/s MR Vmean:       389.5 cm/s MR PISA:        1.57 cm MR PISA Radius: 0.50 cm Sunit Tolia Electronically signed by Tessa Lerner Signature Date/Time: 12/13/2023/5:50:46 PM    Final    DG Abd 1 View Result Date: 12/13/2023 CLINICAL DATA:  OG tube placement EXAM: ABDOMEN - 1 VIEW COMPARISON:  None Available. FINDINGS: Limited x-ray for tube placement has a enteric tube with tip overlying the upper stomach. Otherwise nonspecific bowel gas pattern with scattered colonic stool. Overlapping cardiac leads and defibrillator pads. Catheter along the pelvis. IMPRESSION: Enteric tube with tip overlying the upper stomach. Nonspecific bowel gas pattern with diffuse colonic stool Electronically Signed   By: Karen Kays M.D.   On: 12/13/2023 11:41   DG Chest Port 1 View Result Date: 12/12/2023 CLINICAL DATA:  Status post intubation. EXAM: PORTABLE CHEST 1 VIEW COMPARISON:  Chest radiograph dated 12/11/2022. FINDINGS: The heart is enlarged. Vascular calcifications are seen in the aortic arch. The lungs are clear. Degenerative changes are seen in the spine. An endotracheal tube terminates 2.0 cm from the carina. IMPRESSION: 1. Endotracheal tube terminates 2.0 cm from the carina. 2. Cardiomegaly. Electronically Signed   By: Romona Curls M.D.   On: 12/12/2023 18:42   CT Head Wo Contrast Result Date: 12/12/2023 CLINICAL DATA:  Mental status change, unknown cause EXAM: CT HEAD WITHOUT CONTRAST TECHNIQUE: Contiguous axial images were obtained from the base of the skull through the vertex without intravenous contrast. RADIATION DOSE REDUCTION: This exam was performed according to the departmental dose-optimization program which includes automated exposure control,  adjustment of the mA and/or kV according to patient size and/or use of iterative reconstruction technique. COMPARISON:  Head CT 10/03/2015 FINDINGS: Motion degraded exam. Brain: No hemorrhage. No hydrocephalus. No extra-axial fluid collection. There is a chronic appearing, but technically age indeterminate infarct in the medial left occipital lobe (series 4, image 15), new compared to 2016. No mass effect. There is a rounded 8 x 7 mm extra-axial lesion in the right petrous clival region (series 4, image 8), new from prior exam. This is nonspecific, but could represent a meningioma. Further evaluation with a contrast-enhanced brain MRI. Chronic infarcts in the  right frontal lobe. Vascular: No hyperdense vessel or unexpected calcification. Skull: Normal. Negative for fracture or focal lesion. Sinuses/Orbits: No middle ear or mastoid effusion. Paranasal sinuses are clear. Orbits are unremarkable. Other: None. IMPRESSION: 1. Motion degraded exam. Within this limitation, no acute intracranial abnormality. 2. Chronic appearing, but technically age indeterminate infarct in the medial left occipital lobe, new compared to 2016. 3. Rounded 8 x 7 mm extra-axial lesion in the right petrous clival region, new from prior exam. This is nonspecific, but could represent a meningioma. Further evaluation with a contrast-enhanced brain MRI is recommended. Electronically Signed   By: Lorenza Cambridge M.D.   On: 12/12/2023 17:57   DG Chest Port 1 View Result Date: 12/12/2023 CLINICAL DATA:  Shortness of breath. EXAM: PORTABLE CHEST 1 VIEW COMPARISON:  06/28/2023 FINDINGS: Stable cardiac enlargement. Aortic atherosclerosis. No pleural fluid, interstitial edema or airspace disease. Visualized osseous structures are intact. IMPRESSION: 1. No acute findings. 2. Cardiomegaly. Electronically Signed   By: Signa Kell M.D.   On: 12/12/2023 15:55    Microbiology No results found for this or any previous visit (from the past 240  hours).  Lab Basic Metabolic Panel: No results for input(s): "NA", "K", "CL", "CO2", "GLUCOSE", "BUN", "CREATININE", "CALCIUM", "MG", "PHOS" in the last 168 hours. Liver Function Tests: No results for input(s): "AST", "ALT", "ALKPHOS", "BILITOT", "PROT", "ALBUMIN" in the last 168 hours. No results for input(s): "LIPASE", "AMYLASE" in the last 168 hours. No results for input(s): "AMMONIA" in the last 168 hours. CBC: No results for input(s): "WBC", "NEUTROABS", "HGB", "HCT", "MCV", "PLT" in the last 168 hours. Cardiac Enzymes: No results for input(s): "CKTOTAL", "CKMB", "CKMBINDEX", "TROPONINI" in the last 168 hours. Sepsis Labs: No results for input(s): "PROCALCITON", "WBC", "LATICACIDVEN" in the last 168 hours.  Procedures/Operations  Endotracheal Intubation CPR Central line placement   Martina Sinner 01/05/2024, 5:48 PM

## 2024-01-09 DEATH — deceased
